# Patient Record
Sex: Female | Born: 1950
Health system: Southern US, Community
[De-identification: ages and names within clinical notes are randomized; demographics above are authoritative.]

## PROBLEM LIST (undated history)

## (undated) DIAGNOSIS — F32A Depression, unspecified: Secondary | ICD-10-CM

## (undated) DIAGNOSIS — F329 Major depressive disorder, single episode, unspecified: Secondary | ICD-10-CM

## (undated) DIAGNOSIS — K219 Gastro-esophageal reflux disease without esophagitis: Secondary | ICD-10-CM

## (undated) DIAGNOSIS — Z720 Tobacco use: Secondary | ICD-10-CM

## (undated) HISTORY — DX: Tobacco use: Z72.0

## (undated) HISTORY — PX: EYE SURGERY: SHX253

## (undated) HISTORY — PX: ABDOMINAL HYSTERECTOMY: SHX81

---
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
## 1999-12-13 ENCOUNTER — Encounter: Admission: RE | Admit: 1999-12-13 | Discharge: 1999-12-13 | Payer: Self-pay | Admitting: Internal Medicine

## 1999-12-13 ENCOUNTER — Encounter: Payer: Self-pay | Admitting: Internal Medicine

## 1999-12-17 ENCOUNTER — Other Ambulatory Visit: Admission: RE | Admit: 1999-12-17 | Discharge: 1999-12-17 | Payer: Self-pay | Admitting: Internal Medicine

## 2002-09-20 ENCOUNTER — Encounter: Admission: RE | Admit: 2002-09-20 | Discharge: 2002-09-20 | Payer: Self-pay | Admitting: Internal Medicine

## 2002-09-20 ENCOUNTER — Encounter: Payer: Self-pay | Admitting: Internal Medicine

## 2003-07-23 ENCOUNTER — Ambulatory Visit (HOSPITAL_BASED_OUTPATIENT_CLINIC_OR_DEPARTMENT_OTHER): Admission: RE | Admit: 2003-07-23 | Discharge: 2003-07-23 | Payer: Self-pay | Admitting: *Deleted

## 2004-04-02 ENCOUNTER — Encounter: Admission: RE | Admit: 2004-04-02 | Discharge: 2004-04-02 | Payer: Self-pay | Admitting: Internal Medicine

## 2004-06-25 ENCOUNTER — Encounter: Admission: RE | Admit: 2004-06-25 | Discharge: 2004-06-25 | Payer: Self-pay | Admitting: Internal Medicine

## 2005-01-10 ENCOUNTER — Ambulatory Visit (HOSPITAL_COMMUNITY): Admission: RE | Admit: 2005-01-10 | Discharge: 2005-01-10 | Payer: Self-pay | Admitting: Gastroenterology

## 2005-01-10 ENCOUNTER — Encounter (INDEPENDENT_AMBULATORY_CARE_PROVIDER_SITE_OTHER): Payer: Self-pay | Admitting: *Deleted

## 2005-07-29 ENCOUNTER — Emergency Department (HOSPITAL_COMMUNITY): Admission: EM | Admit: 2005-07-29 | Discharge: 2005-07-29 | Payer: Self-pay | Admitting: Emergency Medicine

## 2006-06-29 ENCOUNTER — Encounter: Admission: RE | Admit: 2006-06-29 | Discharge: 2006-06-29 | Payer: Self-pay | Admitting: Internal Medicine

## 2007-03-09 ENCOUNTER — Encounter: Admission: RE | Admit: 2007-03-09 | Discharge: 2007-03-09 | Payer: Self-pay | Admitting: Internal Medicine

## 2008-03-11 ENCOUNTER — Encounter: Admission: RE | Admit: 2008-03-11 | Discharge: 2008-03-11 | Payer: Self-pay | Admitting: Internal Medicine

## 2008-03-14 ENCOUNTER — Ambulatory Visit (HOSPITAL_COMMUNITY): Admission: RE | Admit: 2008-03-14 | Discharge: 2008-03-14 | Payer: Self-pay | Admitting: Internal Medicine

## 2008-03-14 ENCOUNTER — Encounter: Admission: RE | Admit: 2008-03-14 | Discharge: 2008-03-14 | Payer: Self-pay | Admitting: Internal Medicine

## 2010-05-28 ENCOUNTER — Ambulatory Visit (HOSPITAL_COMMUNITY): Admission: RE | Admit: 2010-05-28 | Discharge: 2010-05-28 | Payer: Self-pay | Admitting: Ophthalmology

## 2010-06-16 ENCOUNTER — Encounter: Admission: RE | Admit: 2010-06-16 | Discharge: 2010-06-16 | Payer: Self-pay | Admitting: Internal Medicine

## 2010-10-24 ENCOUNTER — Encounter: Payer: Self-pay | Admitting: Internal Medicine

## 2011-01-09 ENCOUNTER — Emergency Department (HOSPITAL_COMMUNITY)
Admission: EM | Admit: 2011-01-09 | Discharge: 2011-01-09 | Disposition: A | Discharge status: Home / Self Care | Payer: BC Managed Care – PPO | Attending: Emergency Medicine | Admitting: Emergency Medicine

## 2011-01-09 ENCOUNTER — Emergency Department (HOSPITAL_COMMUNITY): Payer: BC Managed Care – PPO

## 2011-01-09 DIAGNOSIS — R143 Flatulence: Secondary | ICD-10-CM | POA: Insufficient documentation

## 2011-01-09 DIAGNOSIS — R1033 Periumbilical pain: Secondary | ICD-10-CM | POA: Insufficient documentation

## 2011-01-09 DIAGNOSIS — R109 Unspecified abdominal pain: Secondary | ICD-10-CM | POA: Insufficient documentation

## 2011-01-09 DIAGNOSIS — K8689 Other specified diseases of pancreas: Secondary | ICD-10-CM | POA: Insufficient documentation

## 2011-01-09 DIAGNOSIS — R141 Gas pain: Secondary | ICD-10-CM | POA: Insufficient documentation

## 2011-01-09 DIAGNOSIS — R142 Eructation: Secondary | ICD-10-CM | POA: Insufficient documentation

## 2011-01-09 DIAGNOSIS — R112 Nausea with vomiting, unspecified: Secondary | ICD-10-CM | POA: Insufficient documentation

## 2011-01-09 DIAGNOSIS — K5732 Diverticulitis of large intestine without perforation or abscess without bleeding: Secondary | ICD-10-CM | POA: Insufficient documentation

## 2011-01-09 LAB — COMPREHENSIVE METABOLIC PANEL
ALT: 11 U/L (ref 0–35)
Albumin: 3.8 g/dL (ref 3.5–5.2)
Alkaline Phosphatase: 86 U/L (ref 39–117)
BUN: 6 mg/dL (ref 6–23)
Chloride: 106 mEq/L (ref 96–112)
Glucose, Bld: 98 mg/dL (ref 70–99)
Potassium: 4.1 mEq/L (ref 3.5–5.1)
Sodium: 135 mEq/L (ref 135–145)
Total Bilirubin: 1 mg/dL (ref 0.3–1.2)

## 2011-01-09 LAB — URINALYSIS, ROUTINE W REFLEX MICROSCOPIC
Bilirubin Urine: NEGATIVE
Glucose, UA: NEGATIVE mg/dL
Ketones, ur: NEGATIVE mg/dL
Protein, ur: NEGATIVE mg/dL
pH: 7 (ref 5.0–8.0)

## 2011-01-09 LAB — DIFFERENTIAL
Basophils Absolute: 0 10*3/uL (ref 0.0–0.1)
Lymphocytes Relative: 24 % (ref 12–46)
Lymphs Abs: 2.1 10*3/uL (ref 0.7–4.0)
Neutro Abs: 6.2 10*3/uL (ref 1.7–7.7)
Neutrophils Relative %: 70 % (ref 43–77)

## 2011-01-09 LAB — CBC
HCT: 40.1 % (ref 36.0–46.0)
MCV: 87 fL (ref 78.0–100.0)
Platelets: 245 10*3/uL (ref 150–400)
RBC: 4.61 MIL/uL (ref 3.87–5.11)
WBC: 8.9 10*3/uL (ref 4.0–10.5)

## 2011-01-09 LAB — URINE MICROSCOPIC-ADD ON

## 2011-01-09 MED ORDER — IOHEXOL 300 MG/ML  SOLN
100.0000 mL | Freq: Once | INTRAMUSCULAR | Status: AC | PRN
  Administered 2011-01-09: 100 mL via INTRAVENOUS

## 2011-01-30 NOTE — Consult Note (Signed)
Maureen Rodgers, Maureen Rodgers NO.:  [id number]  MEDICAL RECORD NO.:  [id number]           PATIENT TYPE:  E  LOCATION:  [facility]                         FACILITY:  [facility]  PHYSICIAN:  Peggye Pitt, M.D. DATE OF BIRTH:  [birth year]  DATE OF CONSULTATION:  01/09/2011 DATE OF DISCHARGE:                                CONSULTATION   PRIMARY CARE PHYSICIAN:  Robyn N. Allyne Gee, MD  CHIEF COMPLAINT:  Abdominal pain, nausea, vomiting.  HISTORY OF PRESENT ILLNESS:  [name] is a 60 year old African- American lady with past medical history only significant for anxiety disorder, who presents to the hospital today with about 4 days of left lower quadrant abdominal pain.  Today, she had one episode of bile emesis and decided to come into the hospital for evaluation.  She has had no fevers or any other symptoms.  She has been able to tolerate food and in fact has had a full meal here in the emergency department.  A CAT scan had shown sigmoid diverticulitis and we were asked to admit her for further evaluation.  ALLERGIES:  She has no known drug allergies.  PAST MEDICAL HISTORY:  Significant for anxiety disorder and insomnia.  HOME MEDICATIONS: 1. Zoloft 50 mg daily. 2. Trazodone 50 mg at bedtime.  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  Denies any illicit drug use.  FAMILY HISTORY:  Very significant for colon cancer.  REVIEW OF SYSTEMS:  Negative except as mentioned in history present illness.  PHYSICAL EXAM:  VITAL SIGNS:  On admission, blood pressure 115/78, heart rate 60, respirations 20, sats of 100% on room air, and temperature of 98.0. GENERAL:  She is alert, awake, oriented x3, in no distress. HEENT:  Normocephalic, atraumatic.  Pupils are equal, round, and reactive to light and accommodation.  She has intact extraocular movements.  Moist mucous membranes. NECK:  Supple.   No JVD.  No lymphadenopathy.  No bruits.  No goiter. HEART:  Regular rate and rhythm with no murmurs, rubs, or gallops. LUNGS:  Clear to auscultation bilaterally. ABDOMEN:  Soft, slightly tender to palpation to the left lower quadrant. There is no guarding, no rebound, no masses or organomegaly are noted. She does have positive bowel sounds. EXTREMITIES:  No edema.  Positive pulses. NEUROLOGIC:  Grossly intact and nonfocal.  LABORATORY DATA:  Labs on admission; sodium 135, potassium 4.1, chloride 106, bicarb 18, BUN 6, creatinine 0.88, glucose of 98.  All of her LFTs are within normal limits.  WBC is 8.9, hemoglobin 13.9, platelets of 245.  Lipase of 18.  A CT scan of the abdomen and pelvis shows uncomplicated sigmoid diverticulitis as well as dilatation of the proximal pancreatic duct, recommend follow up MRI.  ASSESSMENT AND PLAN: 1. Uncomplicated sigmoid diverticulitis.  The patient has only had one     episode of nausea.  She is able to tolerate meals.  I believe at     this point that she will not gain any further benefit from     admission to  the hospital, and so I will discharge her home to     complete a 21-day course of antibiotics to include Cipro 500 mg     twice daily as well as Flagyl 500 mg three times a day.  I will     also give her a prescription for Zofran for nausea and for Vicodin     for pain as needed. 2. Dilatation of the proximal pancreatic duct.  This was noted     incidentally on her CT scan.  The radiologist is recommending an     MRCP to follow up once her abdominal pain has improved and she can     tolerate holding her breath in for a period of time.  She can     follow up with her GI physician, Dr. Loreta Ave for these issues. 3. I have asked her to secure follow up appointment with her PCP, Dr.     Allyne Gee next week to follow up on her diverticulitis.     Peggye Pitt, M.D.     EH/MEDQ  D:  01/09/2011  T:  01/09/2011  Job:  [id number]  cc:   Candyce Churn. Allyne Gee, M.D.  Electronically Signed by Peggye Pitt M.D. on 01/30/2011 09:37:15 AM

## 2011-02-18 NOTE — Op Note (Signed)
Maureen Rodgers, Maureen Rodgers             ACCOUNT NO.:  [id number]   MEDICAL RECORD NO.:  [id number]          PATIENT TYPE:  AMB   LOCATION:  ENDO                         FACILITY:  [facility]   PHYSICIAN:  Anselmo Rod, M.D.  DATE OF BIRTH:  04/28/1951   DATE OF PROCEDURE:  01/10/2005  DATE OF DISCHARGE:                                 OPERATIVE REPORT   PROCEDURE PERFORMED:  Colonoscopy with multiple cold biopsies.   ENDOSCOPIST:  Charna Elizabeth, M.D.   INSTRUMENT USED:  Olympus video colonoscope.   INDICATIONS FOR PROCEDURE:  The patient is a 60 year old African-American  female undergoing screening colonoscopy to rule out colonic polyps, masses,  etc.   PREPROCEDURE PREPARATION:  Informed consent was procured from the patient.  The patient was fasted for eight hours prior to the procedure and prepped  with a bottle of magnesium citrate and a gallon of GoLYTELY the night prior  to the procedure.  The risks and benefits of the procedure including a 10%  miss rate for cancer and polyps was discussed with her as well.   PREPROCEDURE PHYSICAL:  The patient had stable vital signs.  Neck supple.  Chest clear to auscultation.  S1 and S2 regular.  Abdomen soft with normal  bowel sounds.   DESCRIPTION OF PROCEDURE:  The patient was placed in left lateral decubitus  position and sedated with 100 mg of Demerol and 10 mg of Versed in slow  incremental doses.  Once the patient was adequately sedated and maintained  on low flow oxygen and continuous cardiac monitoring, the Olympus video  colonoscope was advanced from the rectum to the cecum.  The appendicular  orifice and ileocecal valve were clearly visualized and photographed.  There  were a few sigmoid diverticula noted.  These were in early stages of  formation.  Five small sessile rectosigmoid polyps were biopsied (cold  biopsies).  A small sessile polyp was biopsied from the hepatic flexure.  The rest of the exam up to the terminal ileum was  unremarkable.  Small  internal hemorrhoids were seen on retroflexion.  The patient tolerated the  procedure well without complication.   IMPRESSION:  1.  Small nonbleeding internal hemorrhoids seen on retroflexion.  2.  Five small sessile polyps biopsied from rectosigmoid colon and one small      sessile polyp biopsied from hepatic flexure.  3.  Few early sigmoid diverticula.  4.  Normal-appearing transverse colon except for the small polyp biopsied      close to hepatic flexure.  5.  Normal-appearing right colon, cecum and terminal ileum.   RECOMMENDATIONS:  1.  Await pathology results.  2.  Avoid all nonsteroidals including aspirin for the next two weeks.  3.  Outpatient followup in the next two weeks or earlier if need be.      JNM/MEDQ  D:  01/10/2005  T:  01/10/2005  Job:  [id number]   cc:   Candyce Churn. Allyne Gee, M.D.  [address]  [address]  [place]  [place] [address]  Fax: [phone]

## 2011-02-18 NOTE — Op Note (Signed)
   NAME:  Maureen Rodgers, Maureen Rodgers                       ACCOUNT NO.:  [id number]   MEDICAL RECORD NO.:  [id number]                   PATIENT TYPE:  AMB   LOCATION:  [facility]SC                                  FACILITY:  [facility]   PHYSICIAN:  Lowell Bouton, M.D.      DATE OF BIRTH:  05-07-1951   DATE OF PROCEDURE:  07/23/2003  DATE OF DISCHARGE:                                 OPERATIVE REPORT   PREOPERATIVE DIAGNOSIS:  Left carpal tunnel syndrome.   POSTOPERATIVE DIAGNOSIS:  Left carpal tunnel syndrome.   OPERATION PERFORMED:  Decompression of median nerve, left carpal tunnel.   SURGEON:  Lowell Bouton, M.D.   ANESTHESIA:  0.5% Marcaine local with sedation.   OPERATIVE FINDINGS:  The patient had no masses in the carpal canal and the  motor branch of the nerve was intact.   DESCRIPTION OF PROCEDURE:  Under 0.5% Marcaine local anesthesia with a  tourniquet on the left arm, the left hand was prepped and draped in the  usual fashion and after exsanguinating the limb, the tourniquet was inflated  to 250 mmHg.  A 3 cm longitudinal incision was made in the palm just ulnar  to the thenar crease.  Sharp dissection was carried through the subcutaneous  tissues and bleeding points were coagulated.  Blunt dissection was carried  down through the superficial palmar fascia distal to the transverse carpal  ligament.  A hemostat was placed in the carpal canal up against the hook of  the hamate and the transverse carpal ligament was divided on the ulnar  border of the median nerve.  The proximal end of the ligament was divided  with the scissors after dissecting the nerve away from the undersurface of  the ligament.  The carpal canal was then palpated and was found to be  adequately decompressed.  The nerve was examined and the motor branch  identified.  The wound was then irrigated with saline.  The skin was closed  with 4-0 nylon suture.  Sterile dressings were applied followed by a  volar  wrist splint.  The patient tolerated the procedure well and went to the  recovery room awake and stable in  good condition.                                                Lowell Bouton, M.D.    EMM/MEDQ  D:  07/23/2003  T:  07/23/2003  Job:  [id number]   cc:   Candyce Churn. Allyne Gee, M.D.  [address]  [address]  [place]  [place] [address]  Fax: [phone]

## 2011-06-07 ENCOUNTER — Emergency Department (HOSPITAL_COMMUNITY)
Admission: EM | Admit: 2011-06-07 | Discharge: 2011-06-07 | Disposition: A | Discharge status: Home / Self Care | Payer: BC Managed Care – PPO | Attending: Emergency Medicine | Admitting: Emergency Medicine

## 2011-06-07 ENCOUNTER — Emergency Department (HOSPITAL_COMMUNITY): Payer: BC Managed Care – PPO

## 2011-06-07 DIAGNOSIS — S62109A Fracture of unspecified carpal bone, unspecified wrist, initial encounter for closed fracture: Secondary | ICD-10-CM | POA: Insufficient documentation

## 2011-06-07 DIAGNOSIS — Y92009 Unspecified place in unspecified non-institutional (private) residence as the place of occurrence of the external cause: Secondary | ICD-10-CM | POA: Insufficient documentation

## 2011-06-07 DIAGNOSIS — M25539 Pain in unspecified wrist: Secondary | ICD-10-CM | POA: Insufficient documentation

## 2011-06-07 DIAGNOSIS — W010XXA Fall on same level from slipping, tripping and stumbling without subsequent striking against object, initial encounter: Secondary | ICD-10-CM | POA: Insufficient documentation

## 2013-06-05 ENCOUNTER — Other Ambulatory Visit: Payer: Self-pay | Admitting: Dermatology

## 2013-07-28 ENCOUNTER — Emergency Department (HOSPITAL_COMMUNITY)
Admission: EM | Admit: 2013-07-28 | Discharge: 2013-07-29 | Disposition: A | Discharge status: Home / Self Care | Payer: BC Managed Care – PPO | Attending: Emergency Medicine | Admitting: Emergency Medicine

## 2013-07-28 ENCOUNTER — Emergency Department (HOSPITAL_COMMUNITY): Payer: BC Managed Care – PPO

## 2013-07-28 ENCOUNTER — Encounter (HOSPITAL_COMMUNITY): Payer: Self-pay | Admitting: Emergency Medicine

## 2013-07-28 DIAGNOSIS — F17200 Nicotine dependence, unspecified, uncomplicated: Secondary | ICD-10-CM | POA: Insufficient documentation

## 2013-07-28 DIAGNOSIS — M25561 Pain in right knee: Secondary | ICD-10-CM

## 2013-07-28 DIAGNOSIS — Z79899 Other long term (current) drug therapy: Secondary | ICD-10-CM | POA: Insufficient documentation

## 2013-07-28 DIAGNOSIS — K219 Gastro-esophageal reflux disease without esophagitis: Secondary | ICD-10-CM | POA: Insufficient documentation

## 2013-07-28 DIAGNOSIS — M25569 Pain in unspecified knee: Secondary | ICD-10-CM | POA: Insufficient documentation

## 2013-07-28 HISTORY — DX: Gastro-esophageal reflux disease without esophagitis: K21.9

## 2013-07-28 MED ORDER — OXYCODONE-ACETAMINOPHEN 5-325 MG PO TABS: 2.0000 | ORAL_TABLET | Freq: Four times a day (QID) | ORAL | Status: DC | PRN

## 2013-07-28 MED ORDER — IBUPROFEN 400 MG PO TABS
800.0000 mg | ORAL_TABLET | Freq: Once | ORAL | Status: AC
  Administered 2013-07-29: 800 mg via ORAL
  Filled 2013-07-28: qty 2

## 2013-07-28 NOTE — ED Provider Notes (Signed)
CSN: [id number]     Arrival date & time 07/28/13  2224 History   First MD Initiated Contact with Patient 07/28/13 2310    This chart was scribed for Ivar Drape PA-C, a non-physician practitioner working with Gwyneth Sprout, MD by Lewanda Rife, ED Scribe. This patient was seen in room TR11C/TR11C and the patient's care was started at 11:28 PM     Chief Complaint  Patient presents with  . Knee Pain   (Consider location/radiation/quality/duration/timing/severity/associated sxs/prior Treatment) The history is provided by the patient. No language interpreter was used.   HPI Comments: Maureen Rodgers is a 62 y.o. female who presents to the Emergency Department complaining of constant worsening right knee pain radiating to right hip onset today while walking up stairs at home. Describes pain as shooting. Reports associated swelling. Reports pain is exacerbated by walking and flexion of right knee. Reports trying ice, elevation, and ibuprofen with mild relief of symptoms. Denies associated recent trauma/injury, recent falls, fever, decreased ROM, weakness, and numbness. Reports having an antalgic gait.    Past Medical History  Diagnosis Date  . GERD (gastroesophageal reflux disease)    History reviewed. No pertinent past surgical history. History reviewed. No pertinent family history. History  Substance Use Topics  . Smoking status: Current Every Day Smoker  . Smokeless tobacco: Not on file  . Alcohol Use: No   OB History   Grav Para Term Preterm Abortions TAB SAB Ect Mult Living                 Review of Systems  Constitutional: Negative for fever.  Musculoskeletal: Positive for arthralgias.  All other systems reviewed and are negative.   A complete 10 system review of systems was obtained and all systems are negative except as noted in the HPI and PMHx.    Allergies  Review of patient's allergies indicates no known allergies.  Home Medications   Current Outpatient  Rx  Name  Route  Sig  Dispense  Refill  . alendronate (FOSAMAX) 70 MG tablet   Oral   Take 70 mg by mouth every 7 (seven) days. Take with a full glass of water on an empty stomach. Friday         . ALPRAZolam (XANAX) 0.5 MG tablet   Oral   Take 0.5 mg by mouth at bedtime as needed for sleep (anxiety).         Marland Kitchen esomeprazole (NEXIUM) 40 MG capsule   Oral   Take 40 mg by mouth daily before breakfast.         . ibuprofen (ADVIL,MOTRIN) 200 MG tablet   Oral   Take 200 mg by mouth every 6 (six) hours as needed for pain.         . Omega-3 Fatty Acids (FISH OIL) 1000 MG CAPS   Oral   Take 1,000 mg by mouth daily.         . sertraline (ZOLOFT) 100 MG tablet   Oral   Take 100 mg by mouth daily.         . traZODone (DESYREL) 50 MG tablet   Oral   Take 50 mg by mouth at bedtime.         . Vitamin D, Ergocalciferol, (DRISDOL) 50000 UNITS CAPS capsule   Oral   Take 50,000 Units by mouth See admin instructions. Pt takes twice weekly. Tuesday and Friday          BP 128/64  Pulse 75  Temp(Src) 98.3  F (36.8 C) (Oral)  Resp 16  SpO2 100% Physical Exam  Nursing note and vitals reviewed. Constitutional: She is oriented to person, place, and time. She appears well-developed and well-nourished. No distress.  HENT:  Head: Normocephalic and atraumatic.  Eyes: EOM are normal.  Neck: Neck supple. No tracheal deviation present.  Cardiovascular: Normal rate, intact distal pulses and normal pulses.   Pulses:      Dorsalis pedis pulses are 2+ on the right side.       Posterior tibial pulses are 2+ on the right side.  Pulmonary/Chest: Effort normal. No respiratory distress.  Musculoskeletal: Normal range of motion. She exhibits tenderness. She exhibits no edema.       Right knee: She exhibits normal range of motion, no swelling, no deformity and no erythema. Tenderness found. Medial joint line and lateral joint line tenderness noted. No patellar tendon tenderness noted.   Right knee: No bony tenderness. Pain with flexion. Positive McMurray's test.    Neurological: She is alert and oriented to person, place, and time. She has normal strength. No sensory deficit.  Skin: Skin is warm and dry.  No erythema, or evidence of infection  Psychiatric: She has a normal mood and affect. Her behavior is normal.    ED Course  Procedures  COORDINATION OF CARE:  Nursing notes reviewed. Vital signs reviewed. Initial pt interview and examination performed.   11:10 PM-Discussed work up plan with pt at bedside, which includes x-ray right knee. Pt agrees with plan.  11:24 PM Nursing Notes Reviewed/ Care Coordinated Applicable Imaging Reviewed and incorporated into ED treatment Discussed results and treatment plan with pt. Pt demonstrates understanding and agrees with plan to f/u with Orthopedics during the week. Encouraged to continue icing, elevate, and take NSAIDS.   Treatment plan initiated: Medications  ibuprofen (ADVIL,MOTRIN) tablet 800 mg (not administered)    Initial diagnostic testing ordered.    Imaging Review Dg Knee Complete 4 Views Right  07/28/2013   CLINICAL DATA:  Pain wall climbing stairs.  EXAM: RIGHT KNEE - COMPLETE 4+ VIEW  COMPARISON:  None.  FINDINGS: There is no evidence of fracture, dislocation, or joint effusion. There is no evidence of arthropathy or other focal bone abnormality. Soft tissues are unremarkable.  IMPRESSION: Negative.   Electronically Signed   By: Oley Balm M.D.   On: 07/28/2013 23:05    EKG Interpretation   None       MDM   1. Knee pain, acute, right    Patient with right knee pain.  Appears to be meniscus.  Will give knee sleeve and crutches.  No unilateral leg calf swelling.  No evidence of infection.  Pain worsened with knee stability testing.  Discharge home with pain meds and ortho follow-up.  Patient is able to ambulate, however, it is painful.  I personally performed the services described in this  documentation, which was scribed in my presence. The recorded information has been reviewed and is accurate.     Roxy Horseman, PA-C 07/28/13 2356

## 2013-07-28 NOTE — ED Notes (Signed)
Patient states she was stepping up at home and felt a pain in right knee.  She states the there has not been any injury to that knee.  She continues with the pain.  Some swelling to right knee noted.

## 2013-07-28 NOTE — ED Notes (Signed)
X-ray notified to bring pt to room

## 2013-07-29 NOTE — Progress Notes (Signed)
Orthopedic Tech Progress Note Patient Details:  Maureen Rodgers 12-07-1950 [id number]  Ortho Devices Type of Ortho Device: Knee Sleeve;Crutches   Haskell Flirt 07/29/2013, 12:07 AM

## 2013-07-30 NOTE — ED Provider Notes (Signed)
Medical screening examination/treatment/procedure(s) were performed by non-physician practitioner and as supervising physician I was immediately available for consultation/collaboration.  EKG Interpretation   None         Lainey Nelson M Allie Gerhold, DO 07/30/13 0702 

## 2015-04-10 ENCOUNTER — Encounter (HOSPITAL_COMMUNITY): Payer: Self-pay | Admitting: Emergency Medicine

## 2015-04-10 ENCOUNTER — Emergency Department (HOSPITAL_COMMUNITY)
Admission: EM | Admit: 2015-04-10 | Discharge: 2015-04-10 | Disposition: A | Discharge status: Home / Self Care | Payer: BLUE CROSS/BLUE SHIELD | Source: Home / Self Care

## 2015-04-10 DIAGNOSIS — W57XXXA Bitten or stung by nonvenomous insect and other nonvenomous arthropods, initial encounter: Secondary | ICD-10-CM | POA: Diagnosis not present

## 2015-04-10 DIAGNOSIS — T148 Other injury of unspecified body region: Secondary | ICD-10-CM

## 2015-04-10 MED ORDER — TRIAMCINOLONE ACETONIDE 40 MG/ML IJ SUSP
40.0000 mg | Freq: Once | INTRAMUSCULAR | Status: AC
  Administered 2015-04-10: 40 mg via INTRAMUSCULAR

## 2015-04-10 MED ORDER — HYDROXYZINE HCL 25 MG PO TABS: 25.0000 mg | ORAL_TABLET | Freq: Four times a day (QID) | ORAL | Status: DC

## 2015-04-10 MED ORDER — TRIAMCINOLONE ACETONIDE 40 MG/ML IJ SUSP
INTRAMUSCULAR | Status: AC
  Filled 2015-04-10: qty 1

## 2015-04-10 MED ORDER — FLUTICASONE PROPIONATE 0.05 % EX CREA: TOPICAL_CREAM | Freq: Two times a day (BID) | CUTANEOUS | Status: DC

## 2015-04-10 NOTE — ED Provider Notes (Signed)
CSN: [id number]     Arrival date & time 04/10/15  1855 History   None    Chief Complaint  Patient presents with  . Insect Bite   (Consider location/radiation/quality/duration/timing/severity/associated sxs/prior Treatment) Patient is a 64 y.o. female presenting with rash. The history is provided by the patient.  Rash Location:  Shoulder/arm Shoulder/arm rash location:  L forearm and R forearm Quality: itchiness   Severity:  Moderate Onset quality:  Sudden Duration:  1 day Progression:  Spreading Chronicity:  New Context: insect bite/sting   Context comment:  Onset after working in garden last eve. Relieved by:  Nothing Worsened by:  Nothing tried Ineffective treatments:  None tried Associated symptoms: no fever and not wheezing     Past Medical History  Diagnosis Date  . GERD (gastroesophageal reflux disease)    Past Surgical History  Procedure Laterality Date  . Abdominal hysterectomy    . Eye surgery     History reviewed. No pertinent family history. History  Substance Use Topics  . Smoking status: Current Every Day Smoker  . Smokeless tobacco: Not on file  . Alcohol Use: No   OB History    No data available     Review of Systems  Constitutional: Negative.  Negative for fever.  Respiratory: Negative for wheezing.   Skin: Positive for rash.    Allergies  Review of patient's allergies indicates no known allergies.  Home Medications   Prior to Admission medications   Medication Sig Start Date End Date Taking? Authorizing Provider  alendronate (FOSAMAX) 70 MG tablet Take 70 mg by mouth every 7 (seven) days. Take with a full glass of water on an empty stomach. Friday   Yes Historical Provider, MD  Omega-3 Fatty Acids (FISH OIL) 1000 MG CAPS Take 1,000 mg by mouth daily.   Yes Historical Provider, MD  sertraline (ZOLOFT) 100 MG tablet Take 100 mg by mouth daily.   Yes Historical Provider, MD  traZODone (DESYREL) 50 MG tablet Take 50 mg by mouth at bedtime.    Yes Historical Provider, MD  Vitamin D, Ergocalciferol, (DRISDOL) 50000 UNITS CAPS capsule Take 50,000 Units by mouth See admin instructions. Pt takes twice weekly. Tuesday and Friday   Yes Historical Provider, MD  ALPRAZolam Duanne Moron) 0.5 MG tablet Take 0.5 mg by mouth at bedtime as needed for sleep (anxiety).    Historical Provider, MD  esomeprazole (NEXIUM) 40 MG capsule Take 40 mg by mouth daily before breakfast.    Historical Provider, MD  fluticasone (CUTIVATE) 0.05 % cream Apply topically 2 (two) times daily. 04/10/15   Billy Fischer, MD  hydrOXYzine (ATARAX/VISTARIL) 25 MG tablet Take 1 tablet (25 mg total) by mouth every 6 (six) hours. For itching 04/10/15   Billy Fischer, MD  ibuprofen (ADVIL,MOTRIN) 200 MG tablet Take 200 mg by mouth every 6 (six) hours as needed for pain.    Historical Provider, MD  oxyCODONE-acetaminophen (PERCOCET/ROXICET) 5-325 MG per tablet Take 2 tablets by mouth every 6 (six) hours as needed for pain. 07/28/13   Montine Circle, PA-C   BP 152/90 mmHg  Pulse 73  Temp(Src) 98.1 F (36.7 C) (Oral)  Resp 16  SpO2 94% Physical Exam  Constitutional: She is oriented to person, place, and time. She appears well-developed and well-nourished.  Neurological: She is alert and oriented to person, place, and time.  Skin: Skin is warm and dry. Rash noted.  Scattered crusting papular lesions irreg on bilat forearms only.  Nursing note and vitals reviewed.  ED Course  Procedures (including critical care time) Labs Review Labs Reviewed - No data to display  Imaging Review No results found.   MDM   1. Multiple insect bites        Billy Fischer, MD 04/10/15 [phone]

## 2015-04-10 NOTE — ED Notes (Signed)
Pt was working in her flower garden last night and when she went in to shower, she noticed a few bites on her arms.  Since then it has spread all up her right arm and this morning her left arm.

## 2015-07-20 ENCOUNTER — Ambulatory Visit
Admission: RE | Admit: 2015-07-20 | Discharge: 2015-07-20 | Disposition: A | Discharge status: Home / Self Care | Payer: BLUE CROSS/BLUE SHIELD | Source: Ambulatory Visit | Attending: Internal Medicine | Admitting: Internal Medicine

## 2015-07-20 ENCOUNTER — Other Ambulatory Visit: Payer: Self-pay | Admitting: Nurse Practitioner

## 2015-07-20 DIAGNOSIS — R0781 Pleurodynia: Secondary | ICD-10-CM

## 2015-07-20 DIAGNOSIS — R05 Cough: Secondary | ICD-10-CM

## 2015-07-20 DIAGNOSIS — R059 Cough, unspecified: Secondary | ICD-10-CM

## 2015-10-23 ENCOUNTER — Emergency Department (HOSPITAL_COMMUNITY)
Admission: EM | Admit: 2015-10-23 | Discharge: 2015-10-23 | Disposition: A | Discharge status: Home / Self Care | Payer: BLUE CROSS/BLUE SHIELD | Attending: Emergency Medicine | Admitting: Emergency Medicine

## 2015-10-23 ENCOUNTER — Emergency Department (HOSPITAL_COMMUNITY): Payer: BLUE CROSS/BLUE SHIELD

## 2015-10-23 ENCOUNTER — Encounter (HOSPITAL_COMMUNITY): Payer: Self-pay | Admitting: Emergency Medicine

## 2015-10-23 DIAGNOSIS — K219 Gastro-esophageal reflux disease without esophagitis: Secondary | ICD-10-CM | POA: Diagnosis not present

## 2015-10-23 DIAGNOSIS — Z79899 Other long term (current) drug therapy: Secondary | ICD-10-CM | POA: Diagnosis not present

## 2015-10-23 DIAGNOSIS — F17200 Nicotine dependence, unspecified, uncomplicated: Secondary | ICD-10-CM | POA: Insufficient documentation

## 2015-10-23 DIAGNOSIS — K5792 Diverticulitis of intestine, part unspecified, without perforation or abscess without bleeding: Secondary | ICD-10-CM | POA: Insufficient documentation

## 2015-10-23 DIAGNOSIS — R112 Nausea with vomiting, unspecified: Secondary | ICD-10-CM | POA: Diagnosis present

## 2015-10-23 LAB — URINALYSIS, ROUTINE W REFLEX MICROSCOPIC
BILIRUBIN URINE: NEGATIVE
GLUCOSE, UA: NEGATIVE mg/dL
HGB URINE DIPSTICK: NEGATIVE
Ketones, ur: NEGATIVE mg/dL
Leukocytes, UA: NEGATIVE
Nitrite: NEGATIVE
PH: 6 (ref 5.0–8.0)
Protein, ur: NEGATIVE mg/dL

## 2015-10-23 LAB — COMPREHENSIVE METABOLIC PANEL
ALBUMIN: 3.8 g/dL (ref 3.5–5.0)
ALK PHOS: 55 U/L (ref 38–126)
ALT: 15 U/L (ref 14–54)
ANION GAP: 11 (ref 5–15)
AST: 17 U/L (ref 15–41)
BUN: <5 mg/dL — ABNORMAL LOW (ref 6–20)
CHLORIDE: 105 mmol/L (ref 101–111)
CO2: 25 mmol/L (ref 22–32)
Calcium: 9.5 mg/dL (ref 8.9–10.3)
Creatinine, Ser: 1.03 mg/dL — ABNORMAL HIGH (ref 0.44–1.00)
GFR calc non Af Amer: 56 mL/min — ABNORMAL LOW (ref >60)
GLUCOSE: 117 mg/dL — AB (ref 65–99)
Potassium: 3.3 mmol/L — ABNORMAL LOW (ref 3.5–5.1)
SODIUM: 141 mmol/L (ref 135–145)
Total Bilirubin: 0.8 mg/dL (ref 0.3–1.2)
Total Protein: 7.3 g/dL (ref 6.5–8.1)

## 2015-10-23 LAB — CBC
HCT: 43.4 % (ref 36.0–46.0)
HEMOGLOBIN: 14.7 g/dL (ref 12.0–15.0)
MCH: 29.8 pg (ref 26.0–34.0)
MCHC: 33.9 g/dL (ref 30.0–36.0)
MCV: 88 fL (ref 78.0–100.0)
Platelets: 259 10*3/uL (ref 150–400)
RBC: 4.93 MIL/uL (ref 3.87–5.11)
RDW: 13.2 % (ref 11.5–15.5)
WBC: 11.7 10*3/uL — ABNORMAL HIGH (ref 4.0–10.5)

## 2015-10-23 LAB — LIPASE, BLOOD: LIPASE: 17 U/L (ref 11–51)

## 2015-10-23 MED ORDER — IOHEXOL 300 MG/ML  SOLN
25.0000 mL | Freq: Once | INTRAMUSCULAR | Status: AC | PRN
  Administered 2015-10-23: 25 mL via ORAL

## 2015-10-23 MED ORDER — IOHEXOL 300 MG/ML  SOLN
100.0000 mL | Freq: Once | INTRAMUSCULAR | Status: AC | PRN
  Administered 2015-10-23: 100 mL via INTRAVENOUS

## 2015-10-23 MED ORDER — ACETAMINOPHEN 500 MG PO TABS
1000.0000 mg | ORAL_TABLET | Freq: Once | ORAL | Status: AC
  Administered 2015-10-23: 1000 mg via ORAL
  Filled 2015-10-23: qty 2

## 2015-10-23 MED ORDER — SODIUM CHLORIDE 0.9 % IV BOLUS (SEPSIS)
1000.0000 mL | Freq: Once | INTRAVENOUS | Status: AC
  Administered 2015-10-23: 1000 mL via INTRAVENOUS

## 2015-10-23 MED ORDER — CIPROFLOXACIN HCL 500 MG PO TABS
500.0000 mg | ORAL_TABLET | Freq: Once | ORAL | Status: AC
  Administered 2015-10-23: 500 mg via ORAL
  Filled 2015-10-23: qty 1

## 2015-10-23 MED ORDER — MORPHINE SULFATE (PF) 4 MG/ML IV SOLN
4.0000 mg | Freq: Once | INTRAVENOUS | Status: AC
  Administered 2015-10-23: 4 mg via INTRAVENOUS
  Filled 2015-10-23: qty 1

## 2015-10-23 MED ORDER — CIPROFLOXACIN HCL 500 MG PO TABS: 500.0000 mg | ORAL_TABLET | Freq: Two times a day (BID) | ORAL | Status: DC

## 2015-10-23 MED ORDER — ONDANSETRON 4 MG PO TBDP
ORAL_TABLET | ORAL | Status: AC
  Filled 2015-10-23: qty 1

## 2015-10-23 MED ORDER — METRONIDAZOLE 500 MG PO TABS: 500.0000 mg | ORAL_TABLET | Freq: Three times a day (TID) | ORAL | Status: DC

## 2015-10-23 MED ORDER — METRONIDAZOLE 500 MG PO TABS
500.0000 mg | ORAL_TABLET | Freq: Once | ORAL | Status: AC
Start: 2015-10-23 — End: 2015-10-23
  Administered 2015-10-23: 500 mg via ORAL
  Filled 2015-10-23: qty 1

## 2015-10-23 MED ORDER — ONDANSETRON 4 MG PO TBDP
4.0000 mg | ORAL_TABLET | Freq: Once | ORAL | Status: AC | PRN
Start: 2015-10-23 — End: 2015-10-23
  Administered 2015-10-23: 4 mg via ORAL

## 2015-10-23 MED ORDER — ONDANSETRON 4 MG PO TBDP: ORAL_TABLET | ORAL | Status: DC

## 2015-10-23 MED ORDER — HYDROCODONE-ACETAMINOPHEN 5-325 MG PO TABS: 1.0000 | ORAL_TABLET | Freq: Four times a day (QID) | ORAL | Status: DC | PRN

## 2015-10-23 NOTE — ED Notes (Signed)
Onset last night LLQ shooting pain with nausea, emesis and chills. Continued today with radiating lower back pain.

## 2015-10-23 NOTE — ED Provider Notes (Signed)
CSN: [id number]     Arrival date & time 10/23/15  1224 History   First MD Initiated Contact with Patient 10/23/15 1639     Chief Complaint  Patient presents with  . Abdominal Pain  . Nausea  . Emesis     (Consider location/radiation/quality/duration/timing/severity/associated sxs/prior Treatment) The history is provided by the patient.  Maureen Rodgers is a 65 y.o. female history diverticulitis here presenting with left lower quadrant pain, vomiting, chills. Patient states that she has diffuse abdominal pain yesterday. It was severe enough and cause her to vomit all night. Today the pain radiated to the left lower quadrant and is sharp. It's associated with some chills as well. Denies any urinary symptoms. Denies any constipation or diarrhea. Patient states that she had diverticulitis previously and this is similar.      Past Medical History  Diagnosis Date  . GERD (gastroesophageal reflux disease)    Past Surgical History  Procedure Laterality Date  . Abdominal hysterectomy    . Eye surgery     No family history on file. Social History  Substance Use Topics  . Smoking status: Current Every Day Smoker  . Smokeless tobacco: None  . Alcohol Use: No   OB History    No data available     Review of Systems  Gastrointestinal: Positive for vomiting and abdominal pain.  All other systems reviewed and are negative.     Allergies  Review of patient's allergies indicates no known allergies.  Home Medications   Prior to Admission medications   Medication Sig Start Date End Date Taking? Authorizing Provider  alendronate (FOSAMAX) 70 MG tablet Take 70 mg by mouth every 7 (seven) days. Take with a full glass of water on an empty stomach. Friday   Yes Historical Provider, MD  ALPRAZolam Duanne Moron) 0.5 MG tablet Take 0.5 mg by mouth at bedtime as needed for sleep (anxiety).   Yes Historical Provider, MD  esomeprazole (NEXIUM) 40 MG capsule Take 40 mg by mouth daily before breakfast.    Yes Historical Provider, MD  Omega-3 Fatty Acids (FISH OIL) 1000 MG CAPS Take 1,000 mg by mouth daily.   Yes Historical Provider, MD  PROAIR HFA 108 (90 Base) MCG/ACT inhaler INHALE 2 PUFFS EVERY 4-6 HOURS 07/20/15  Yes Historical Provider, MD  traZODone (DESYREL) 50 MG tablet Take 50 mg by mouth at bedtime.   Yes Historical Provider, MD  Vitamin D, Ergocalciferol, (DRISDOL) 50000 UNITS CAPS capsule Take 50,000 Units by mouth See admin instructions. Pt takes twice weekly. Tuesday and Friday   Yes Historical Provider, MD   BP 119/89 mmHg  Pulse 76  Temp(Src) 98.6 F (37 C) (Oral)  Resp 20  Ht 5\' 3"  (1.6 m)  Wt 140 lb (63.504 kg)  BMI 24.81 kg/m2  SpO2 100% Physical Exam  Constitutional: She is oriented to person, place, and time. She appears well-nourished.  Uncomfortable   HENT:  Head: Normocephalic.  Mouth/Throat: Oropharynx is clear and moist.  Eyes: Conjunctivae are normal. Pupils are equal, round, and reactive to light.  Neck: Normal range of motion. Neck supple.  Cardiovascular: Normal rate, regular rhythm and normal heart sounds.   Pulmonary/Chest: Effort normal and breath sounds normal. No respiratory distress. She has no wheezes. She has no rales.  Abdominal: Soft. Bowel sounds are normal.  + mild diffuse tenderness, worse in LLQ.   Musculoskeletal: Normal range of motion. She exhibits no edema or tenderness.  Neurological: She is alert and oriented to person, place, and  time.  Skin: Skin is warm and dry.  Psychiatric: She has a normal mood and affect. Her behavior is normal. Judgment and thought content normal.  Nursing note and vitals reviewed.   ED Course  Procedures (including critical care time) Labs Review Labs Reviewed  COMPREHENSIVE METABOLIC PANEL - Abnormal; Notable for the following:    Potassium 3.3 (*)    Glucose, Bld 117 (*)    BUN <5 (*)    Creatinine, Ser 1.03 (*)    GFR calc non Af Amer 56 (*)    All other components within normal limits  CBC -  Abnormal; Notable for the following:    WBC 11.7 (*)    All other components within normal limits  URINALYSIS, ROUTINE W REFLEX MICROSCOPIC (NOT AT [facility]) - Abnormal; Notable for the following:    Specific Gravity, Urine <1.005 (*)    All other components within normal limits  LIPASE, BLOOD    Imaging Review Ct Abdomen Pelvis W Contrast  10/23/2015  CLINICAL DATA:  Abdominal pain and distention for 2 days EXAM: CT ABDOMEN AND PELVIS WITH CONTRAST TECHNIQUE: Multidetector CT imaging of the abdomen and pelvis was performed using the standard protocol following bolus administration of intravenous contrast. CONTRAST:  179mL OMNIPAQUE IOHEXOL 300 MG/ML  SOLN COMPARISON:  01/09/2011 FINDINGS: Lung bases are free of acute infiltrate or sizable effusion. The liver is diffusely decreased in attenuation consistent with fatty infiltration. The gallbladder, spleen, adrenal glands and pancreas are within normal limits. The kidneys are well visualized bilaterally and demonstrate a normal enhancement pattern with the exception of a cyst in the inferior aspect of the right kidney which is stable from the previous exam. Scattered small periaortic lymph nodes are noted. The appendix is within normal limits. The bladder is well distended. The uterus has been surgically removed. Minimal free pelvic fluid is noted. There are changes consistent with diverticulitis at the junction of the descending and sigmoid colons. Mild inflammatory changes are seen although no perforation or abscess formation is noted. The osseous structures show degenerative change of the lumbar spine. IMPRESSION: Changes consistent with sigmoid diverticulitis without evidence of perforation or abscess formation. Electronically Signed   By: Inez Catalina M.D.   On: 10/23/2015 17:54   I have personally reviewed and evaluated these images and lab results as part of my medical decision-making.   EKG Interpretation None      MDM   Final diagnoses:   None   Maureen Rodgers is a 65 y.o. female here with ab pain, vomiting. Worse LLQ tenderness. Consider diverticulitis vs UTI vs SBO. Will get labs, UA, CT ab/pel. Will hydrate and reassess.   8:37 PM CT showed uncomplicated diverticulitis. Able to keep down oral flagyl, cipro. Will dc home with cipro, flagyl, zofran, prn vicodin.     Wandra Arthurs, MD 10/23/15 2038

## 2015-10-23 NOTE — ED Notes (Signed)
Pt given urinal for specimen.  

## 2015-10-23 NOTE — Discharge Instructions (Signed)
Take cipro and flagyl as prescribed.  Take zofran for nausea. Stay hydrated.   Take motrin for pain. Take vicodin for severe pain, do not drive with it.   See your doctor./   Return to ER if you have severe pain, vomiting, dehydration, fever

## 2016-07-18 DIAGNOSIS — F419 Anxiety disorder, unspecified: Secondary | ICD-10-CM | POA: Diagnosis not present

## 2016-07-18 DIAGNOSIS — Z7189 Other specified counseling: Secondary | ICD-10-CM | POA: Diagnosis not present

## 2016-08-11 DIAGNOSIS — F331 Major depressive disorder, recurrent, moderate: Secondary | ICD-10-CM | POA: Diagnosis not present

## 2016-08-11 DIAGNOSIS — F411 Generalized anxiety disorder: Secondary | ICD-10-CM | POA: Diagnosis not present

## 2016-08-18 DIAGNOSIS — F419 Anxiety disorder, unspecified: Secondary | ICD-10-CM | POA: Diagnosis not present

## 2016-08-18 DIAGNOSIS — F331 Major depressive disorder, recurrent, moderate: Secondary | ICD-10-CM | POA: Diagnosis not present

## 2016-09-14 ENCOUNTER — Ambulatory Visit
Admission: RE | Admit: 2016-09-14 | Discharge: 2016-09-14 | Disposition: A | Discharge status: Home / Self Care | Payer: Medicare Other | Source: Ambulatory Visit | Attending: Internal Medicine | Admitting: Internal Medicine

## 2016-09-14 ENCOUNTER — Other Ambulatory Visit: Payer: Self-pay | Admitting: Internal Medicine

## 2016-09-14 DIAGNOSIS — Z1231 Encounter for screening mammogram for malignant neoplasm of breast: Secondary | ICD-10-CM

## 2016-09-14 DIAGNOSIS — E559 Vitamin D deficiency, unspecified: Secondary | ICD-10-CM | POA: Diagnosis not present

## 2016-09-14 DIAGNOSIS — R0982 Postnasal drip: Secondary | ICD-10-CM | POA: Diagnosis not present

## 2016-09-14 DIAGNOSIS — F17200 Nicotine dependence, unspecified, uncomplicated: Secondary | ICD-10-CM | POA: Diagnosis not present

## 2016-09-14 DIAGNOSIS — R001 Bradycardia, unspecified: Secondary | ICD-10-CM | POA: Diagnosis not present

## 2016-09-14 DIAGNOSIS — R7309 Other abnormal glucose: Secondary | ICD-10-CM | POA: Diagnosis not present

## 2016-09-14 DIAGNOSIS — Z0000 Encounter for general adult medical examination without abnormal findings: Secondary | ICD-10-CM | POA: Diagnosis not present

## 2016-09-21 DIAGNOSIS — Z6822 Body mass index (BMI) 22.0-22.9, adult: Secondary | ICD-10-CM | POA: Diagnosis not present

## 2016-09-21 DIAGNOSIS — Z01411 Encounter for gynecological examination (general) (routine) with abnormal findings: Secondary | ICD-10-CM | POA: Diagnosis not present

## 2016-09-21 DIAGNOSIS — D171 Benign lipomatous neoplasm of skin and subcutaneous tissue of trunk: Secondary | ICD-10-CM | POA: Diagnosis not present

## 2016-09-22 DIAGNOSIS — Z13820 Encounter for screening for osteoporosis: Secondary | ICD-10-CM | POA: Diagnosis not present

## 2016-09-22 DIAGNOSIS — E559 Vitamin D deficiency, unspecified: Secondary | ICD-10-CM | POA: Diagnosis not present

## 2016-10-03 HISTORY — PX: BILATERAL CARPAL TUNNEL RELEASE: SHX6508

## 2016-10-06 DIAGNOSIS — F411 Generalized anxiety disorder: Secondary | ICD-10-CM | POA: Diagnosis not present

## 2016-10-06 DIAGNOSIS — F331 Major depressive disorder, recurrent, moderate: Secondary | ICD-10-CM | POA: Diagnosis not present

## 2016-12-12 DIAGNOSIS — F411 Generalized anxiety disorder: Secondary | ICD-10-CM | POA: Diagnosis not present

## 2016-12-12 DIAGNOSIS — F331 Major depressive disorder, recurrent, moderate: Secondary | ICD-10-CM | POA: Diagnosis not present

## 2017-01-20 DIAGNOSIS — J209 Acute bronchitis, unspecified: Secondary | ICD-10-CM | POA: Diagnosis not present

## 2017-01-30 DIAGNOSIS — F411 Generalized anxiety disorder: Secondary | ICD-10-CM | POA: Diagnosis not present

## 2017-01-30 DIAGNOSIS — F331 Major depressive disorder, recurrent, moderate: Secondary | ICD-10-CM | POA: Diagnosis not present

## 2017-02-14 DIAGNOSIS — K5730 Diverticulosis of large intestine without perforation or abscess without bleeding: Secondary | ICD-10-CM | POA: Diagnosis not present

## 2017-02-14 DIAGNOSIS — R634 Abnormal weight loss: Secondary | ICD-10-CM | POA: Diagnosis not present

## 2017-02-14 DIAGNOSIS — K219 Gastro-esophageal reflux disease without esophagitis: Secondary | ICD-10-CM | POA: Diagnosis not present

## 2017-03-21 DIAGNOSIS — F411 Generalized anxiety disorder: Secondary | ICD-10-CM | POA: Diagnosis not present

## 2017-03-21 DIAGNOSIS — F331 Major depressive disorder, recurrent, moderate: Secondary | ICD-10-CM | POA: Diagnosis not present

## 2017-04-06 ENCOUNTER — Other Ambulatory Visit: Payer: Self-pay | Admitting: Gastroenterology

## 2017-04-06 DIAGNOSIS — R1032 Left lower quadrant pain: Secondary | ICD-10-CM | POA: Diagnosis not present

## 2017-04-06 DIAGNOSIS — Z86010 Personal history of colonic polyps: Secondary | ICD-10-CM | POA: Diagnosis not present

## 2017-04-06 DIAGNOSIS — K5732 Diverticulitis of large intestine without perforation or abscess without bleeding: Secondary | ICD-10-CM | POA: Diagnosis not present

## 2017-04-06 NOTE — Progress Notes (Signed)
Dacota Ruben MD 

## 2017-04-07 ENCOUNTER — Ambulatory Visit
Admission: RE | Admit: 2017-04-07 | Discharge: 2017-04-07 | Disposition: A | Discharge status: Home / Self Care | Payer: Medicare Other | Source: Ambulatory Visit | Attending: Gastroenterology | Admitting: Gastroenterology

## 2017-04-07 DIAGNOSIS — K5792 Diverticulitis of intestine, part unspecified, without perforation or abscess without bleeding: Secondary | ICD-10-CM | POA: Diagnosis not present

## 2017-04-07 DIAGNOSIS — R1032 Left lower quadrant pain: Secondary | ICD-10-CM

## 2017-04-07 MED ORDER — IOPAMIDOL (ISOVUE-300) INJECTION 61%
100.0000 mL | Freq: Once | INTRAVENOUS | Status: AC | PRN
  Administered 2017-04-07: 100 mL via INTRAVENOUS

## 2017-04-17 DIAGNOSIS — F411 Generalized anxiety disorder: Secondary | ICD-10-CM | POA: Diagnosis not present

## 2017-04-17 DIAGNOSIS — F331 Major depressive disorder, recurrent, moderate: Secondary | ICD-10-CM | POA: Diagnosis not present

## 2017-04-18 DIAGNOSIS — K5730 Diverticulosis of large intestine without perforation or abscess without bleeding: Secondary | ICD-10-CM | POA: Diagnosis not present

## 2017-04-18 DIAGNOSIS — Z800 Family history of malignant neoplasm of digestive organs: Secondary | ICD-10-CM | POA: Diagnosis not present

## 2017-04-18 DIAGNOSIS — Z86010 Personal history of colonic polyps: Secondary | ICD-10-CM | POA: Diagnosis not present

## 2017-04-18 DIAGNOSIS — K449 Diaphragmatic hernia without obstruction or gangrene: Secondary | ICD-10-CM | POA: Diagnosis not present

## 2017-04-24 DIAGNOSIS — F411 Generalized anxiety disorder: Secondary | ICD-10-CM | POA: Diagnosis not present

## 2017-04-24 DIAGNOSIS — F331 Major depressive disorder, recurrent, moderate: Secondary | ICD-10-CM | POA: Diagnosis not present

## 2017-05-01 DIAGNOSIS — F331 Major depressive disorder, recurrent, moderate: Secondary | ICD-10-CM | POA: Diagnosis not present

## 2017-05-01 DIAGNOSIS — F411 Generalized anxiety disorder: Secondary | ICD-10-CM | POA: Diagnosis not present

## 2017-05-08 DIAGNOSIS — F331 Major depressive disorder, recurrent, moderate: Secondary | ICD-10-CM | POA: Diagnosis not present

## 2017-05-08 DIAGNOSIS — F411 Generalized anxiety disorder: Secondary | ICD-10-CM | POA: Diagnosis not present

## 2017-05-22 DIAGNOSIS — F411 Generalized anxiety disorder: Secondary | ICD-10-CM | POA: Diagnosis not present

## 2017-05-22 DIAGNOSIS — F331 Major depressive disorder, recurrent, moderate: Secondary | ICD-10-CM | POA: Diagnosis not present

## 2017-06-12 DIAGNOSIS — F331 Major depressive disorder, recurrent, moderate: Secondary | ICD-10-CM | POA: Diagnosis not present

## 2017-06-12 DIAGNOSIS — F411 Generalized anxiety disorder: Secondary | ICD-10-CM | POA: Diagnosis not present

## 2017-06-13 DIAGNOSIS — F411 Generalized anxiety disorder: Secondary | ICD-10-CM | POA: Diagnosis not present

## 2017-06-13 DIAGNOSIS — F331 Major depressive disorder, recurrent, moderate: Secondary | ICD-10-CM | POA: Diagnosis not present

## 2017-06-22 DIAGNOSIS — Z720 Tobacco use: Secondary | ICD-10-CM | POA: Diagnosis not present

## 2017-06-22 DIAGNOSIS — J209 Acute bronchitis, unspecified: Secondary | ICD-10-CM | POA: Diagnosis not present

## 2017-08-08 DIAGNOSIS — F411 Generalized anxiety disorder: Secondary | ICD-10-CM | POA: Diagnosis not present

## 2017-08-08 DIAGNOSIS — F331 Major depressive disorder, recurrent, moderate: Secondary | ICD-10-CM | POA: Diagnosis not present

## 2017-08-31 ENCOUNTER — Other Ambulatory Visit: Payer: Self-pay | Admitting: Internal Medicine

## 2017-08-31 DIAGNOSIS — Z139 Encounter for screening, unspecified: Secondary | ICD-10-CM

## 2017-09-04 DIAGNOSIS — F331 Major depressive disorder, recurrent, moderate: Secondary | ICD-10-CM | POA: Diagnosis not present

## 2017-09-04 DIAGNOSIS — F411 Generalized anxiety disorder: Secondary | ICD-10-CM | POA: Diagnosis not present

## 2017-09-19 DIAGNOSIS — F411 Generalized anxiety disorder: Secondary | ICD-10-CM | POA: Diagnosis not present

## 2017-09-19 DIAGNOSIS — F331 Major depressive disorder, recurrent, moderate: Secondary | ICD-10-CM | POA: Diagnosis not present

## 2017-10-02 ENCOUNTER — Ambulatory Visit
Admission: RE | Admit: 2017-10-02 | Discharge: 2017-10-02 | Disposition: A | Discharge status: Home / Self Care | Payer: Medicare Other | Source: Ambulatory Visit | Attending: Internal Medicine | Admitting: Internal Medicine

## 2017-10-02 DIAGNOSIS — Z139 Encounter for screening, unspecified: Secondary | ICD-10-CM

## 2017-10-02 DIAGNOSIS — Z1231 Encounter for screening mammogram for malignant neoplasm of breast: Secondary | ICD-10-CM | POA: Diagnosis not present

## 2017-10-16 DIAGNOSIS — F411 Generalized anxiety disorder: Secondary | ICD-10-CM | POA: Diagnosis not present

## 2017-10-16 DIAGNOSIS — F331 Major depressive disorder, recurrent, moderate: Secondary | ICD-10-CM | POA: Diagnosis not present

## 2017-11-03 DIAGNOSIS — F331 Major depressive disorder, recurrent, moderate: Secondary | ICD-10-CM | POA: Diagnosis not present

## 2017-11-03 DIAGNOSIS — F411 Generalized anxiety disorder: Secondary | ICD-10-CM | POA: Diagnosis not present

## 2017-11-16 DIAGNOSIS — E2839 Other primary ovarian failure: Secondary | ICD-10-CM | POA: Diagnosis not present

## 2017-11-16 DIAGNOSIS — E559 Vitamin D deficiency, unspecified: Secondary | ICD-10-CM | POA: Diagnosis not present

## 2017-11-16 DIAGNOSIS — F331 Major depressive disorder, recurrent, moderate: Secondary | ICD-10-CM | POA: Diagnosis not present

## 2017-11-16 DIAGNOSIS — Z79899 Other long term (current) drug therapy: Secondary | ICD-10-CM | POA: Diagnosis not present

## 2017-11-16 DIAGNOSIS — Z0000 Encounter for general adult medical examination without abnormal findings: Secondary | ICD-10-CM | POA: Diagnosis not present

## 2017-11-16 DIAGNOSIS — R7309 Other abnormal glucose: Secondary | ICD-10-CM | POA: Diagnosis not present

## 2017-11-16 DIAGNOSIS — Z1389 Encounter for screening for other disorder: Secondary | ICD-10-CM | POA: Diagnosis not present

## 2017-11-16 DIAGNOSIS — R030 Elevated blood-pressure reading, without diagnosis of hypertension: Secondary | ICD-10-CM | POA: Diagnosis not present

## 2017-11-24 ENCOUNTER — Other Ambulatory Visit: Payer: Self-pay | Admitting: Internal Medicine

## 2017-11-24 DIAGNOSIS — E2839 Other primary ovarian failure: Secondary | ICD-10-CM

## 2017-12-01 ENCOUNTER — Other Ambulatory Visit: Payer: Medicare Other

## 2017-12-20 ENCOUNTER — Other Ambulatory Visit: Payer: Self-pay | Admitting: Internal Medicine

## 2017-12-20 DIAGNOSIS — E2839 Other primary ovarian failure: Secondary | ICD-10-CM

## 2017-12-28 DIAGNOSIS — F331 Major depressive disorder, recurrent, moderate: Secondary | ICD-10-CM | POA: Diagnosis not present

## 2017-12-28 DIAGNOSIS — F411 Generalized anxiety disorder: Secondary | ICD-10-CM | POA: Diagnosis not present

## 2018-01-10 ENCOUNTER — Ambulatory Visit
Admission: RE | Admit: 2018-01-10 | Discharge: 2018-01-10 | Disposition: A | Discharge status: Home / Self Care | Payer: Medicare Other | Source: Ambulatory Visit | Attending: Internal Medicine | Admitting: Internal Medicine

## 2018-01-10 DIAGNOSIS — M810 Age-related osteoporosis without current pathological fracture: Secondary | ICD-10-CM | POA: Diagnosis not present

## 2018-01-10 DIAGNOSIS — Z780 Asymptomatic menopausal state: Secondary | ICD-10-CM | POA: Diagnosis not present

## 2018-01-10 DIAGNOSIS — M85851 Other specified disorders of bone density and structure, right thigh: Secondary | ICD-10-CM | POA: Diagnosis not present

## 2018-01-10 DIAGNOSIS — E2839 Other primary ovarian failure: Secondary | ICD-10-CM

## 2018-01-18 DIAGNOSIS — F411 Generalized anxiety disorder: Secondary | ICD-10-CM | POA: Diagnosis not present

## 2018-01-18 DIAGNOSIS — F331 Major depressive disorder, recurrent, moderate: Secondary | ICD-10-CM | POA: Diagnosis not present

## 2018-02-02 DIAGNOSIS — F331 Major depressive disorder, recurrent, moderate: Secondary | ICD-10-CM | POA: Diagnosis not present

## 2018-02-02 DIAGNOSIS — F411 Generalized anxiety disorder: Secondary | ICD-10-CM | POA: Diagnosis not present

## 2018-02-02 DIAGNOSIS — J320 Chronic maxillary sinusitis: Secondary | ICD-10-CM | POA: Diagnosis not present

## 2018-03-30 DIAGNOSIS — W0110XA Fall on same level from slipping, tripping and stumbling with subsequent striking against unspecified object, initial encounter: Secondary | ICD-10-CM | POA: Diagnosis not present

## 2018-03-30 DIAGNOSIS — M25539 Pain in unspecified wrist: Secondary | ICD-10-CM | POA: Diagnosis not present

## 2018-03-30 DIAGNOSIS — Y92007 Garden or yard of unspecified non-institutional (private) residence as the place of occurrence of the external cause: Secondary | ICD-10-CM | POA: Diagnosis not present

## 2018-04-12 DIAGNOSIS — M65331 Trigger finger, right middle finger: Secondary | ICD-10-CM | POA: Diagnosis not present

## 2018-04-12 DIAGNOSIS — G5601 Carpal tunnel syndrome, right upper limb: Secondary | ICD-10-CM | POA: Diagnosis not present

## 2018-04-13 DIAGNOSIS — F411 Generalized anxiety disorder: Secondary | ICD-10-CM | POA: Diagnosis not present

## 2018-04-13 DIAGNOSIS — F331 Major depressive disorder, recurrent, moderate: Secondary | ICD-10-CM | POA: Diagnosis not present

## 2018-04-18 DIAGNOSIS — F331 Major depressive disorder, recurrent, moderate: Secondary | ICD-10-CM | POA: Diagnosis not present

## 2018-04-18 DIAGNOSIS — F411 Generalized anxiety disorder: Secondary | ICD-10-CM | POA: Diagnosis not present

## 2018-05-02 ENCOUNTER — Other Ambulatory Visit: Payer: Self-pay

## 2018-05-04 DIAGNOSIS — F331 Major depressive disorder, recurrent, moderate: Secondary | ICD-10-CM | POA: Diagnosis not present

## 2018-05-04 DIAGNOSIS — F411 Generalized anxiety disorder: Secondary | ICD-10-CM | POA: Diagnosis not present

## 2018-05-15 DIAGNOSIS — F17200 Nicotine dependence, unspecified, uncomplicated: Secondary | ICD-10-CM | POA: Diagnosis not present

## 2018-05-15 DIAGNOSIS — F331 Major depressive disorder, recurrent, moderate: Secondary | ICD-10-CM | POA: Diagnosis not present

## 2018-05-15 DIAGNOSIS — M810 Age-related osteoporosis without current pathological fracture: Secondary | ICD-10-CM | POA: Diagnosis not present

## 2018-05-15 DIAGNOSIS — Z79899 Other long term (current) drug therapy: Secondary | ICD-10-CM | POA: Diagnosis not present

## 2018-05-16 DIAGNOSIS — G5601 Carpal tunnel syndrome, right upper limb: Secondary | ICD-10-CM | POA: Diagnosis not present

## 2018-05-31 DIAGNOSIS — M65331 Trigger finger, right middle finger: Secondary | ICD-10-CM | POA: Diagnosis not present

## 2018-06-13 DIAGNOSIS — F411 Generalized anxiety disorder: Secondary | ICD-10-CM | POA: Diagnosis not present

## 2018-06-13 DIAGNOSIS — F331 Major depressive disorder, recurrent, moderate: Secondary | ICD-10-CM | POA: Diagnosis not present

## 2018-07-10 DIAGNOSIS — G5601 Carpal tunnel syndrome, right upper limb: Secondary | ICD-10-CM | POA: Diagnosis not present

## 2018-07-11 DIAGNOSIS — F411 Generalized anxiety disorder: Secondary | ICD-10-CM | POA: Diagnosis not present

## 2018-07-11 DIAGNOSIS — F331 Major depressive disorder, recurrent, moderate: Secondary | ICD-10-CM | POA: Diagnosis not present

## 2018-07-13 DIAGNOSIS — F411 Generalized anxiety disorder: Secondary | ICD-10-CM | POA: Diagnosis not present

## 2018-07-13 DIAGNOSIS — F331 Major depressive disorder, recurrent, moderate: Secondary | ICD-10-CM | POA: Diagnosis not present

## 2018-08-13 DIAGNOSIS — F411 Generalized anxiety disorder: Secondary | ICD-10-CM | POA: Diagnosis not present

## 2018-08-13 DIAGNOSIS — F331 Major depressive disorder, recurrent, moderate: Secondary | ICD-10-CM | POA: Diagnosis not present

## 2018-08-17 DIAGNOSIS — G5601 Carpal tunnel syndrome, right upper limb: Secondary | ICD-10-CM | POA: Diagnosis not present

## 2018-08-17 DIAGNOSIS — M65331 Trigger finger, right middle finger: Secondary | ICD-10-CM | POA: Diagnosis not present

## 2018-08-20 DIAGNOSIS — H524 Presbyopia: Secondary | ICD-10-CM | POA: Diagnosis not present

## 2018-08-20 DIAGNOSIS — Z961 Presence of intraocular lens: Secondary | ICD-10-CM | POA: Diagnosis not present

## 2018-08-20 DIAGNOSIS — H04123 Dry eye syndrome of bilateral lacrimal glands: Secondary | ICD-10-CM | POA: Diagnosis not present

## 2018-08-20 DIAGNOSIS — H5213 Myopia, bilateral: Secondary | ICD-10-CM | POA: Diagnosis not present

## 2018-08-20 DIAGNOSIS — H52203 Unspecified astigmatism, bilateral: Secondary | ICD-10-CM | POA: Diagnosis not present

## 2018-09-04 ENCOUNTER — Other Ambulatory Visit: Payer: Self-pay | Admitting: Nurse Practitioner

## 2018-09-04 DIAGNOSIS — M65331 Trigger finger, right middle finger: Secondary | ICD-10-CM | POA: Diagnosis not present

## 2018-09-04 DIAGNOSIS — G5601 Carpal tunnel syndrome, right upper limb: Secondary | ICD-10-CM | POA: Diagnosis not present

## 2018-09-06 DIAGNOSIS — Z86010 Personal history of colonic polyps: Secondary | ICD-10-CM | POA: Diagnosis not present

## 2018-09-06 DIAGNOSIS — K219 Gastro-esophageal reflux disease without esophagitis: Secondary | ICD-10-CM | POA: Diagnosis not present

## 2018-09-06 DIAGNOSIS — K5730 Diverticulosis of large intestine without perforation or abscess without bleeding: Secondary | ICD-10-CM | POA: Diagnosis not present

## 2018-09-06 DIAGNOSIS — Z800 Family history of malignant neoplasm of digestive organs: Secondary | ICD-10-CM | POA: Diagnosis not present

## 2018-09-06 DIAGNOSIS — R1032 Left lower quadrant pain: Secondary | ICD-10-CM | POA: Diagnosis not present

## 2018-09-07 ENCOUNTER — Other Ambulatory Visit: Payer: Self-pay | Admitting: Nurse Practitioner

## 2018-09-07 DIAGNOSIS — J309 Allergic rhinitis, unspecified: Secondary | ICD-10-CM

## 2018-09-07 MED ORDER — FLUTICASONE PROPIONATE 50 MCG/ACT NA SUSP
1.0000 | Freq: Every day | NASAL | 5 refills | Status: DC
Start: 2018-09-07 — End: 2024-04-17

## 2018-09-20 ENCOUNTER — Other Ambulatory Visit: Payer: Self-pay | Admitting: Nurse Practitioner

## 2018-09-27 DIAGNOSIS — Z6822 Body mass index (BMI) 22.0-22.9, adult: Secondary | ICD-10-CM | POA: Diagnosis not present

## 2018-09-27 DIAGNOSIS — Z01411 Encounter for gynecological examination (general) (routine) with abnormal findings: Secondary | ICD-10-CM | POA: Diagnosis not present

## 2018-09-27 DIAGNOSIS — Z124 Encounter for screening for malignant neoplasm of cervix: Secondary | ICD-10-CM | POA: Diagnosis not present

## 2018-10-19 DIAGNOSIS — F331 Major depressive disorder, recurrent, moderate: Secondary | ICD-10-CM | POA: Diagnosis not present

## 2018-10-19 DIAGNOSIS — F411 Generalized anxiety disorder: Secondary | ICD-10-CM | POA: Diagnosis not present

## 2018-10-20 ENCOUNTER — Other Ambulatory Visit: Payer: Self-pay | Admitting: Nurse Practitioner

## 2018-11-22 ENCOUNTER — Ambulatory Visit: Payer: Self-pay | Admitting: Internal Medicine

## 2018-11-22 ENCOUNTER — Ambulatory Visit: Payer: Self-pay

## 2018-11-22 ENCOUNTER — Telehealth: Payer: Self-pay

## 2018-11-22 NOTE — Telephone Encounter (Signed)
Called patient in regards to no show for AWV. She states she was not feeling well this morning and will need to reschedule. She stated that she will call back to reschedule appointment at a later date.

## 2018-12-04 ENCOUNTER — Telehealth: Payer: Self-pay | Admitting: Internal Medicine

## 2018-12-04 NOTE — Telephone Encounter (Signed)
Called to [facility] Medicare Annual Wellness Visit with the Nurse Health Advisor that was missed on 11/22/2018.  Patient states she will have to call back when she has her schedule and isn't driving.  Patient states she also needs to schedule an appointment for her Physical with Dr. Baird Cancer.  If patient returns call, please note: their last AWV was on 11/16/2017, please schedule AWV with [facility] AND PHYSICAL any date AFTER 11/16/2018.  Thank you! For any questions please contact: Janace Hoard at [phone] or Skype [email or web address]

## 2018-12-05 ENCOUNTER — Other Ambulatory Visit: Payer: Self-pay | Admitting: Internal Medicine

## 2018-12-05 DIAGNOSIS — Z1231 Encounter for screening mammogram for malignant neoplasm of breast: Secondary | ICD-10-CM

## 2018-12-06 DIAGNOSIS — G5601 Carpal tunnel syndrome, right upper limb: Secondary | ICD-10-CM | POA: Diagnosis not present

## 2018-12-06 DIAGNOSIS — M65331 Trigger finger, right middle finger: Secondary | ICD-10-CM | POA: Diagnosis not present

## 2018-12-06 DIAGNOSIS — M65332 Trigger finger, left middle finger: Secondary | ICD-10-CM | POA: Diagnosis not present

## 2018-12-07 ENCOUNTER — Ambulatory Visit
Admission: RE | Admit: 2018-12-07 | Discharge: 2018-12-07 | Disposition: A | Discharge status: Home / Self Care | Payer: Medicare Other | Source: Ambulatory Visit | Attending: Internal Medicine | Admitting: Internal Medicine

## 2018-12-07 DIAGNOSIS — Z1231 Encounter for screening mammogram for malignant neoplasm of breast: Secondary | ICD-10-CM

## 2018-12-12 NOTE — Telephone Encounter (Signed)
Spoke with patient and scheduled AWV-s for 12/20/2018 at 10:00 AM, and CPE for 12/24/2018 at 3:15 PM.  Janace Hoard, Care Guide.

## 2018-12-15 ENCOUNTER — Other Ambulatory Visit: Payer: Self-pay | Admitting: Nurse Practitioner

## 2018-12-17 ENCOUNTER — Other Ambulatory Visit: Payer: Self-pay | Admitting: Nurse Practitioner

## 2018-12-19 ENCOUNTER — Ambulatory Visit: Payer: Self-pay

## 2018-12-20 ENCOUNTER — Ambulatory Visit (INDEPENDENT_AMBULATORY_CARE_PROVIDER_SITE_OTHER): Payer: Medicare Other

## 2018-12-20 ENCOUNTER — Other Ambulatory Visit: Payer: Self-pay

## 2018-12-20 VITALS — BP 124/70 | HR 62 | Temp 97.9°F | Ht 63.0 in | Wt 119.6 lb

## 2018-12-20 DIAGNOSIS — Z0000 Encounter for general adult medical examination without abnormal findings: Secondary | ICD-10-CM | POA: Diagnosis not present

## 2018-12-20 NOTE — Patient Instructions (Signed)
Maureen Rodgers , Thank you for taking time to come for your Medicare Wellness Visit. I appreciate your ongoing commitment to your health goals. Please review the following plan we discussed and let me know if I can assist you in the future.   Screening recommendations/referrals: Colonoscopy: 03/2014 Mammogram: 12/2018 Bone Density: 01/2018 Recommended yearly ophthalmology/optometry visit for glaucoma screening and checkup Recommended yearly dental visit for hygiene and checkup  Vaccinations: Influenza vaccine: decline Pneumococcal vaccine: decline Tdap vaccine: 07/2013 Shingles vaccine: discussed    Advanced directives: Advance directive discussed with you today. I have provided a copy for you to complete at home and have notarized. Once this is complete please bring a copy in to our office so we can scan it into your chart.   Conditions/risks identified: smoking  Next appointment: 12/24/2018 at 315   Preventive Care 68 Years and Older, Female Preventive care refers to lifestyle choices and visits with your health care provider that can promote health and wellness. What does preventive care include?  A yearly physical exam. This is also called an annual well check.  Dental exams once or twice a year.  Routine eye exams. Ask your health care provider how often you should have your eyes checked.  Personal lifestyle choices, including:  Daily care of your teeth and gums.  Regular physical activity.  Eating a healthy diet.  Avoiding tobacco and drug use.  Limiting alcohol use.  Practicing safe sex.  Taking low-dose aspirin every day.  Taking vitamin and mineral supplements as recommended by your health care provider. What happens during an annual well check? The services and screenings done by your health care provider during your annual well check will depend on your age, overall health, lifestyle risk factors, and family history of disease. Counseling  Your health care  provider may ask you questions about your:  Alcohol use.  Tobacco use.  Drug use.  Emotional well-being.  Home and relationship well-being.  Sexual activity.  Eating habits.  History of falls.  Memory and ability to understand (cognition).  Work and work Statistician.  Reproductive health. Screening  You may have the following tests or measurements:  Height, weight, and BMI.  Blood pressure.  Lipid and cholesterol levels. These may be checked every 5 years, or more frequently if you are over 44 years old.  Skin check.  Lung cancer screening. You may have this screening every year starting at age 10 if you have a 30-pack-year history of smoking and currently smoke or have quit within the past 15 years.  Fecal occult blood test (FOBT) of the stool. You may have this test every year starting at age 73.  Flexible sigmoidoscopy or colonoscopy. You may have a sigmoidoscopy every 5 years or a colonoscopy every 10 years starting at age 21.  Hepatitis C blood test.  Hepatitis B blood test.  Sexually transmitted disease (STD) testing.  Diabetes screening. This is done by checking your blood sugar (glucose) after you have not eaten for a while (fasting). You may have this done every 1-3 years.  Bone density scan. This is done to screen for osteoporosis. You may have this done starting at age 69.  Mammogram. This may be done every 1-2 years. Talk to your health care provider about how often you should have regular mammograms. Talk with your health care provider about your test results, treatment options, and if necessary, the need for more tests. Vaccines  Your health care provider may recommend certain vaccines, such as:  Influenza vaccine. This is recommended every year.  Tetanus, diphtheria, and acellular pertussis (Tdap, Td) vaccine. You may need a Td booster every 10 years.  Zoster vaccine. You may need this after age 85.  Pneumococcal 13-valent conjugate (PCV13)  vaccine. One dose is recommended after age 5.  Pneumococcal polysaccharide (PPSV23) vaccine. One dose is recommended after age 14. Talk to your health care provider about which screenings and vaccines you need and how often you need them. This information is not intended to replace advice given to you by your health care provider. Make sure you discuss any questions you have with your health care provider. Document Released: 10/16/2015 Document Revised: 06/08/2016 Document Reviewed: 07/21/2015 Elsevier Interactive Patient Education  2017 [place] Prevention in the Home Falls can cause injuries. They can happen to people of all ages. There are many things you can do to make your home safe and to help prevent falls. What can I do on the outside of my home?  Regularly fix the edges of walkways and driveways and fix any cracks.  Remove anything that might make you trip as you walk through a door, such as a raised step or threshold.  Trim any bushes or trees on the path to your home.  Use bright outdoor lighting.  Clear any walking paths of anything that might make someone trip, such as rocks or tools.  Regularly check to see if handrails are loose or broken. Make sure that both sides of any steps have handrails.  Any raised decks and porches should have guardrails on the edges.  Have any leaves, snow, or ice cleared regularly.  Use sand or salt on walking paths during winter.  Clean up any spills in your garage right away. This includes oil or grease spills. What can I do in the bathroom?  Use night lights.  Install grab bars by the toilet and in the tub and shower. Do not use towel bars as grab bars.  Use non-skid mats or decals in the tub or shower.  If you need to sit down in the shower, use a plastic, non-slip stool.  Keep the floor dry. Clean up any water that spills on the floor as soon as it happens.  Remove soap buildup in the tub or shower regularly.   Attach bath mats securely with double-sided non-slip rug tape.  Do not have throw rugs and other things on the floor that can make you trip. What can I do in the bedroom?  Use night lights.  Make sure that you have a light by your bed that is easy to reach.  Do not use any sheets or blankets that are too big for your bed. They should not hang down onto the floor.  Have a firm chair that has side arms. You can use this for support while you get dressed.  Do not have throw rugs and other things on the floor that can make you trip. What can I do in the kitchen?  Clean up any spills right away.  Avoid walking on wet floors.  Keep items that you use a lot in easy-to-reach places.  If you need to reach something above you, use a strong step stool that has a grab bar.  Keep electrical cords out of the way.  Do not use floor polish or wax that makes floors slippery. If you must use wax, use non-skid floor wax.  Do not have throw rugs and other things on the floor that  can make you trip. What can I do with my stairs?  Do not leave any items on the stairs.  Make sure that there are handrails on both sides of the stairs and use them. Fix handrails that are broken or loose. Make sure that handrails are as long as the stairways.  Check any carpeting to make sure that it is firmly attached to the stairs. Fix any carpet that is loose or worn.  Avoid having throw rugs at the top or bottom of the stairs. If you do have throw rugs, attach them to the floor with carpet tape.  Make sure that you have a light switch at the top of the stairs and the bottom of the stairs. If you do not have them, ask someone to add them for you. What else can I do to help prevent falls?  Wear shoes that:  Do not have high heels.  Have rubber bottoms.  Are comfortable and fit you well.  Are closed at the toe. Do not wear sandals.  If you use a stepladder:  Make sure that it is fully opened. Do not climb  a closed stepladder.  Make sure that both sides of the stepladder are locked into place.  Ask someone to hold it for you, if possible.  Clearly mark and make sure that you can see:  Any grab bars or handrails.  First and last steps.  Where the edge of each step is.  Use tools that help you move around (mobility aids) if they are needed. These include:  Canes.  Walkers.  Scooters.  Crutches.  Turn on the lights when you go into a dark area. Replace any light bulbs as soon as they burn out.  Set up your furniture so you have a clear path. Avoid moving your furniture around.  If any of your floors are uneven, fix them.  If there are any pets around you, be aware of where they are.  Review your medicines with your doctor. Some medicines can make you feel dizzy. This can increase your chance of falling. Ask your doctor what other things that you can do to help prevent falls. This information is not intended to replace advice given to you by your health care provider. Make sure you discuss any questions you have with your health care provider. Document Released: 07/16/2009 Document Revised: 02/25/2016 Document Reviewed: 10/24/2014 Elsevier Interactive Patient Education  2017 [employer].

## 2018-12-20 NOTE — Progress Notes (Signed)
Subjective:   Maureen Rodgers is a 68 y.o. female who presents for Medicare Annual (Subsequent) preventive examination.  Review of Systems:  N/a  Cardiac Risk Factors include: advanced age (>57men, >53 women);sedentary lifestyle;smoking/ tobacco exposure     Objective:     Vitals: BP 124/70 (BP Location: Left Arm, Patient Position: Sitting)   Pulse 62   Temp 97.9 F (36.6 C) (Oral)   Ht 5\' 3"  (1.6 m)   Wt 119 lb 9.6 oz (54.3 kg)   BMI 21.19 kg/m   Body mass index is 21.19 kg/m.  Advanced Directives 12/20/2018 10/23/2015  Does Patient Have a Medical Advance Directive? No No  Would patient like information on creating a medical advance directive? Yes (MAU/Ambulatory/Procedural Areas - Information given) No - patient declined information    Tobacco Social History   Tobacco Use  Smoking Status Current Every Day Smoker  . Packs/day: 0.50  Smokeless Tobacco Never Used     Ready to quit: Not Answered Counseling given: Not Answered   Clinical Intake:  Pre-visit preparation completed: Yes  Pain : No/denies pain Pain Score: 0-No pain     Nutritional Status: BMI of 19-24  Normal Nutritional Risks: None Diabetes: No  How often do you need to have someone help you when you read instructions, pamphlets, or other written materials from your doctor or pharmacy?: 1 - Never What is the last grade level you completed in school?: college  Interpreter Needed?: No  Information entered by :: NAllen LPN  Past Medical History:  Diagnosis Date  . GERD (gastroesophageal reflux disease)    Past Surgical History:  Procedure Laterality Date  . ABDOMINAL HYSTERECTOMY    . EYE SURGERY     Family History  Problem Relation Age of Onset  . Heart disease Mother   . Dementia Mother   . Alcohol abuse Mother   . Alcohol abuse Father    Social History   Socioeconomic History  . Marital status: Single    Spouse name: Not on file  . Number of children: Not on file  . Years  of education: Not on file  . Highest education level: Not on file  Occupational History  . Not on file  Social Needs  . Financial resource strain: Not hard at all  . Food insecurity:    Worry: Never true    Inability: Never true  . Transportation needs:    Medical: No    Non-medical: No  Tobacco Use  . Smoking status: Current Every Day Smoker    Packs/day: 0.50  . Smokeless tobacco: Never Used  Substance and Sexual Activity  . Alcohol use: Yes    Alcohol/week: 3.0 standard drinks    Types: 3 Cans of beer per week    Comment: 3 beers a day  . Drug use: No  . Sexual activity: Not Currently  Lifestyle  . Physical activity:    Days per week: 0 days    Minutes per session: 0 min  . Stress: Not at all  Relationships  . Social connections:    Talks on phone: Not on file    Gets together: Not on file    Attends religious service: Not on file    Active member of club or organization: Not on file    Attends meetings of clubs or organizations: Not on file    Relationship status: Not on file  Other Topics Concern  . Not on file  Social History Narrative  . Not on  file    Outpatient Encounter Medications as of 12/20/2018  Medication Sig  . alendronate (FOSAMAX) 70 MG tablet TAKE 1 TABLET BY MOUTH WEEKLY  . ALPRAZolam (XANAX) 0.5 MG tablet Take 0.5 mg by mouth at bedtime as needed for sleep (anxiety).  Marland Kitchen esomeprazole (NEXIUM) 40 MG capsule Take 40 mg by mouth daily before breakfast.  . fluticasone (FLONASE) 50 MCG/ACT nasal spray Place 1 spray into both nostrils daily.  . fluticasone (FLONASE) 50 MCG/ACT nasal spray Place 2 sprays into both nostrils daily for 30 days.  . Omega-3 Fatty Acids (FISH OIL) 1000 MG CAPS Take 1,000 mg by mouth daily.  Marland Kitchen PROAIR HFA 108 (90 Base) MCG/ACT inhaler INHALE 2 PUFFS EVERY 4-6 HOURS  . traZODone (DESYREL) 50 MG tablet Take 50 mg by mouth at bedtime.  . Vitamin D, Ergocalciferol, (DRISDOL) 50000 UNITS CAPS capsule Take 50,000 Units by mouth See  admin instructions. Pt takes twice weekly. Tuesday and Friday  . ciprofloxacin (CIPRO) 500 MG tablet Take 1 tablet (500 mg total) by mouth 2 (two) times daily. One po bid x 7 days (Patient not taking: Reported on 12/20/2018)  . fluticasone (FLONASE) 50 MCG/ACT nasal spray SHAKE LIQUID AND USE 1 SPRAY IN EACH NOSTRIL EVERY DAY  . HYDROcodone-acetaminophen (NORCO/VICODIN) 5-325 MG tablet Take 1 tablet by mouth every 6 (six) hours as needed. (Patient not taking: Reported on 12/20/2018)  . metroNIDAZOLE (FLAGYL) 500 MG tablet Take 1 tablet (500 mg total) by mouth 3 (three) times daily. (Patient not taking: Reported on 12/20/2018)  . ondansetron (ZOFRAN ODT) 4 MG disintegrating tablet 4mg  ODT q4 hours prn nausea/vomit (Patient not taking: Reported on 12/20/2018)   No facility-administered encounter medications on file as of 12/20/2018.     Activities of Daily Living In your present state of health, do you have any difficulty performing the following activities: 12/20/2018  Hearing? N  Vision? Y  Comment need new glasses  Difficulty concentrating or making decisions? Y  Comment does not address a lot of things  Walking or climbing stairs? N  Dressing or bathing? N  Doing errands, shopping? N  Preparing Food and eating ? N  Using the Toilet? N  In the past six months, have you accidently leaked urine? Y  Do you have problems with loss of bowel control? N  Managing your Medications? N  Managing your Finances? N  Housekeeping or managing your Housekeeping? N  Some recent data might be hidden    Patient Care Team: Glendale Chard, MD as PCP - General (Internal Medicine)    Assessment:   This is a routine wellness examination for [place].  Exercise Activities and Dietary recommendations Current Exercise Habits: The patient does not participate in regular exercise at present  Goals    . Patient Stated (pt-stated)     Wants to stop drinking and to gain weight       Fall Risk Fall Risk   12/20/2018 05/02/2018  Falls in the past year? 1 Yes  Comment - Emmi Telephone Survey: data to providers prior to load  Number falls in past yr: 0 2 or more  Comment tripped over root Emmi Telephone Survey Actual Response = 2  Injury with Fall? 1 Yes  Comment had surgery on right hand -  Risk for fall due to : History of fall(s);Medication side effect -  Follow up Falls prevention discussed;Education provided -   Is the patient's home free of loose throw rugs in walkways, pet beds, electrical cords, etc?  yes      Grab bars in the bathroom? no      Handrails on the stairs?   yes      Adequate lighting?   yes  Timed Get Up and Go performed: n/a  Depression Screen PHQ 2/9 Scores 12/20/2018  PHQ - 2 Score 5  PHQ- 9 Score 13     Cognitive Function         There is no immunization history on file for this patient.  Qualifies for Shingles Vaccine? yes  Screening Tests Health Maintenance  Topic Date Due  . Hepatitis C Screening  Aug 11, 1951  . TETANUS/TDAP  03/07/1970  . COLONOSCOPY  03/07/2001  . PNA vac Low Risk Adult (1 of 2 - PCV13) 03/07/2016  . INFLUENZA VACCINE  05/03/2018  . MAMMOGRAM  12/06/2020  . DEXA SCAN  Completed    Cancer Screenings: Lung: Low Dose CT Chest recommended if Age 67-80 years, 30 pack-year currently smoking OR have quit w/in 15years. Patient does qualify. Breast:  Up to date on Mammogram? Yes   Up to date of Bone Density/Dexa? Yes Colorectal: up to date  Additional Screenings: : Hepatitis C Screening: 04/15/2013 <0.1     Plan:    Patient wants to stop drinking and gain back some weight.   I have personally reviewed and noted the following in the patient's chart:   . Medical and social history . Use of alcohol, tobacco or illicit drugs  . Current medications and supplements . Functional ability and status . Nutritional status . Physical activity . Advanced directives . List of other physicians . Hospitalizations, surgeries, and ER  visits in previous 12 months . Vitals . Screenings to include cognitive, depression, and falls . Referrals and appointments  In addition, I have reviewed and discussed with patient certain preventive protocols, quality metrics, and best practice recommendations. A written personalized care plan for preventive services as well as general preventive health recommendations were provided to patient.     Kellie Simmering, LPN  9/79/8921

## 2018-12-24 ENCOUNTER — Other Ambulatory Visit: Payer: Self-pay

## 2018-12-24 ENCOUNTER — Encounter: Payer: Self-pay | Admitting: Nurse Practitioner

## 2018-12-24 ENCOUNTER — Ambulatory Visit (INDEPENDENT_AMBULATORY_CARE_PROVIDER_SITE_OTHER): Payer: Medicare Other | Admitting: Nurse Practitioner

## 2018-12-24 VITALS — BP 140/88 | HR 54 | Temp 97.5°F | Ht 62.8 in | Wt 122.4 lb

## 2018-12-24 DIAGNOSIS — E782 Mixed hyperlipidemia: Secondary | ICD-10-CM | POA: Diagnosis not present

## 2018-12-24 DIAGNOSIS — R001 Bradycardia, unspecified: Secondary | ICD-10-CM | POA: Diagnosis not present

## 2018-12-24 DIAGNOSIS — J209 Acute bronchitis, unspecified: Secondary | ICD-10-CM | POA: Diagnosis not present

## 2018-12-24 DIAGNOSIS — R7309 Other abnormal glucose: Secondary | ICD-10-CM | POA: Diagnosis not present

## 2018-12-24 DIAGNOSIS — Z0000 Encounter for general adult medical examination without abnormal findings: Secondary | ICD-10-CM | POA: Diagnosis not present

## 2018-12-24 LAB — POCT URINALYSIS DIPSTICK
Bilirubin, UA: NEGATIVE
Blood, UA: NEGATIVE
Glucose, UA: NEGATIVE
Ketones, UA: NEGATIVE
Leukocytes, UA: NEGATIVE
NITRITE UA: NEGATIVE
PH UA: 7 (ref 5.0–8.0)
Protein, UA: NEGATIVE
Spec Grav, UA: 1.015 (ref 1.010–1.025)
Urobilinogen, UA: 0.2 E.U./dL

## 2018-12-24 MED ORDER — HYDROCODONE-HOMATROPINE 5-1.5 MG/5ML PO SYRP: 5.0000 mL | ORAL_SOLUTION | Freq: Four times a day (QID) | ORAL | 0 refills | Status: DC | PRN

## 2018-12-24 MED ORDER — PREDNISONE 10 MG (21) PO TBPK: ORAL_TABLET | ORAL | 0 refills | Status: DC

## 2018-12-24 NOTE — Progress Notes (Addendum)
Subjective:     Patient ID: Maureen Rodgers , female    DOB: 1951-05-30 , 68 y.o.   MRN: [id number]   Chief Complaint  Patient presents with  . Health Maintenance   The patient states she uses status post hysterectomy for birth control. Last LMP was No LMP recorded. Patient has had a hysterectomy.. Negative for Dysmenorrhea and Negative for Menorrhagia Mammogram last done 12/10/2018 Negative for: breast discharge, breast lump(s), breast pain and breast self exam.  Pertinent negatives include abnormal bleeding (hematology), anxiety, decreased libido, depression, difficulty falling sleep, dyspareunia, history of infertility, nocturia, sexual dysfunction, sleep disturbances, urinary incontinence, urinary urgency, vaginal discharge and vaginal itching. Diet regular. The patient states her exercise level is  she is working in her yard.  She is being treated for depression, she is dealing with losing her mom and her niece passed from suicide.      The patient's tobacco use is:  Social History   Tobacco Use  Smoking Status Current Every Day Smoker  . Packs/day: 0.50  Smokeless Tobacco Never Used   She has been exposed to passive smoke. The patient's alcohol use is:  Social History   Substance and Sexual Activity  Alcohol Use Yes  . Alcohol/week: 3.0 standard drinks  . Types: 3 Cans of beer per week   Comment: 3 beers a day   Additional information: Last pap (hysterectomy) HPI  Here for HM  Cough  This is a recurrent problem. The current episode started 1 to 4 weeks ago. The problem has been waxing and waning. The problem occurs constantly. The cough is non-productive. Associated symptoms include weight loss. Pertinent negatives include no ear congestion. Nothing aggravates the symptoms. She has tried nothing for the symptoms. The treatment provided no relief. There is no history of asthma or pneumonia.     Past Medical History:  Diagnosis Date  . GERD (gastroesophageal reflux disease)       Family History  Problem Relation Age of Onset  . Heart disease Mother   . Dementia Mother   . Alcohol abuse Mother   . Alcohol abuse Father      Current Outpatient Medications:  .  alendronate (FOSAMAX) 70 MG tablet, TAKE 1 TABLET BY MOUTH WEEKLY, Disp: 12 tablet, Rfl: 3 .  ALPRAZolam (XANAX) 0.5 MG tablet, Take 0.5 mg by mouth at bedtime as needed for sleep (anxiety)., Disp: , Rfl:  .  esomeprazole (NEXIUM) 40 MG capsule, Take 40 mg by mouth daily before breakfast., Disp: , Rfl:  .  fluticasone (FLONASE) 50 MCG/ACT nasal spray, Place 1 spray into both nostrils daily., Disp: 16 g, Rfl: 5 .  Omega-3 Fatty Acids (FISH OIL) 1000 MG CAPS, Take 1,000 mg by mouth daily., Disp: , Rfl:  .  sertraline (ZOLOFT) 100 MG tablet, Take 100 mg by mouth daily., Disp: , Rfl:  .  traZODone (DESYREL) 50 MG tablet, Take 50 mg by mouth at bedtime., Disp: , Rfl:  .  Vitamin D, Ergocalciferol, (DRISDOL) 50000 UNITS CAPS capsule, Take 50,000 Units by mouth See admin instructions. Pt takes twice weekly. Tuesday and Friday, Disp: , Rfl:  .  ciprofloxacin (CIPRO) 500 MG tablet, Take 1 tablet (500 mg total) by mouth 2 (two) times daily. One po bid x 7 days (Patient not taking: Reported on 12/20/2018), Disp: 14 tablet, Rfl: 0 .  fluticasone (FLONASE) 50 MCG/ACT nasal spray, SHAKE LIQUID AND USE 1 SPRAY IN EACH NOSTRIL EVERY DAY, Disp: 48 g, Rfl: 0 .  fluticasone (  FLONASE) 50 MCG/ACT nasal spray, Place 2 sprays into both nostrils daily for 30 days. (Patient not taking: Reported on 12/24/2018), Disp: 48 g, Rfl: 1 .  HYDROcodone-acetaminophen (NORCO/VICODIN) 5-325 MG tablet, Take 1 tablet by mouth every 6 (six) hours as needed. (Patient not taking: Reported on 12/20/2018), Disp: 10 tablet, Rfl: 0 .  HYDROcodone-homatropine (HYDROMET) 5-1.5 MG/5ML syrup, Take 5 mLs by mouth every 6 (six) hours as needed., Disp: 120 mL, Rfl: 0 .  metroNIDAZOLE (FLAGYL) 500 MG tablet, Take 1 tablet (500 mg total) by mouth 3 (three) times  daily. (Patient not taking: Reported on 12/20/2018), Disp: 21 tablet, Rfl: 0 .  ondansetron (ZOFRAN ODT) 4 MG disintegrating tablet, 76m ODT q4 hours prn nausea/vomit (Patient not taking: Reported on 12/24/2018), Disp: 10 tablet, Rfl: 0 .  predniSONE (STERAPRED UNI-PAK 21 TAB) 10 MG (21) TBPK tablet, Take as directed, Disp: 21 tablet, Rfl: 0 .  PROAIR HFA 108 (90 Base) MCG/ACT inhaler, INHALE 2 PUFFS EVERY 4-6 HOURS, Disp: , Rfl: 0   No Known Allergies   Review of Systems  Constitutional: Positive for weight loss.  HENT: Negative.   Eyes: Negative.   Respiratory: Positive for cough.   Cardiovascular: Negative.   Gastrointestinal: Negative.   Endocrine: Negative.   Genitourinary: Negative.   Musculoskeletal: Negative.   Skin: Negative.   Allergic/Immunologic: Negative.   Neurological: Negative.   Hematological: Negative.   Psychiatric/Behavioral: Negative.      Today's Vitals   12/24/18 1538  BP: 140/88  Pulse: (!) 54  Temp: (!) 97.5 F (36.4 C)  TempSrc: Oral  SpO2: 98%  Weight: 122 lb 6.4 oz (55.5 kg)  Height: 5' 2.8" (1.595 m)   Body mass index is 21.82 kg/m.   Objective:  Physical Exam Vitals signs reviewed.  Constitutional:      Appearance: Normal appearance. She is well-developed.  HENT:     Head: Normocephalic and atraumatic.     Right Ear: Hearing, tympanic membrane, ear canal and external ear normal.     Left Ear: Hearing, tympanic membrane, ear canal and external ear normal.     Nose: Nose normal.     Mouth/Throat:     Mouth: Mucous membranes are moist.  Eyes:     General: Lids are normal.     Conjunctiva/sclera: Conjunctivae normal.     Pupils: Pupils are equal, round, and reactive to light.     Funduscopic exam:    Right eye: No papilledema.        Left eye: No papilledema.  Neck:     Musculoskeletal: Full passive range of motion without pain, normal range of motion and neck supple.     Thyroid: No thyroid mass.     Vascular: No carotid bruit.   Cardiovascular:     Rate and Rhythm: Normal rate and regular rhythm.     Pulses: Normal pulses.     Heart sounds: Normal heart sounds. No murmur.  Pulmonary:     Effort: Pulmonary effort is normal.     Breath sounds: Normal breath sounds.  Abdominal:     General: Abdomen is flat. Bowel sounds are normal.     Palpations: Abdomen is soft.  Musculoskeletal: Normal range of motion.        General: No swelling.     Right lower leg: No edema.     Left lower leg: No edema.  Skin:    General: Skin is warm and dry.     Capillary Refill: Capillary refill takes less  than 2 seconds.  Neurological:     General: No focal deficit present.     Mental Status: She is alert and oriented to person, place, and time.     Cranial Nerves: No cranial nerve deficit.     Sensory: No sensory deficit.  Psychiatric:        Mood and Affect: Mood normal.        Behavior: Behavior normal.        Thought Content: Thought content normal.        Judgment: Judgment normal.          Assessment And Plan:     1. Health maintenance examination  Pt's annual wellness exam was performed and geriatric assessment reviewed.   Pt has no new identiafble wellness concerns at this time.   WIll obtain routine labs.   Will obtain UA and micro.   Behavior modifications discussed and diet history reviewed. Pt will continue to exercise regularly and modify diet, with low GI, plant based foods and decrease food intake of processed foods.   Recommend intake of daily multivitamin, Vitamin D, and calcium.  Recommond mammogram for preventive screenings, as well as recommend immunizations that include influenza (up to date) and TDAP  2. Acute bronchitis, unspecified organism  Hacking cough   3 week history of cough not improving much  Will treat empirically as to not get worse - predniSONE (STERAPRED UNI-PAK 21 TAB) 10 MG (21) TBPK tablet; Take as directed  Dispense: 21 tablet; Refill: 0 - HYDROcodone-homatropine  (HYDROMET) 5-1.5 MG/5ML syrup; Take 5 mLs by mouth every 6 (six) hours as needed.  Dispense: 120 mL; Refill: 0  3. Abnormal glucose  Chronic, stable  No current medications  Encouraged to limit intake of sugary foods and drinks  Encouraged to increase physical activity to 150 minutes per week as tolerated - Hemoglobin A1c - Lipid Profile - BMP8+eGFR  4. Mixed hyperlipidemia  Chronic, controlled  Continue with current medications  - Lipid Profile - BMP8+eGFR  5. Bradycardia  Asymptomatic, will continue to monitor.   If begin with symptoms will consider referral to cardiology for further evaluation - EKG 12-Lead      Minette Brine, FNP

## 2018-12-24 NOTE — Patient Instructions (Signed)

## 2019-01-15 DIAGNOSIS — F411 Generalized anxiety disorder: Secondary | ICD-10-CM | POA: Diagnosis not present

## 2019-01-15 DIAGNOSIS — F331 Major depressive disorder, recurrent, moderate: Secondary | ICD-10-CM | POA: Diagnosis not present

## 2019-01-31 DIAGNOSIS — F411 Generalized anxiety disorder: Secondary | ICD-10-CM | POA: Diagnosis not present

## 2019-01-31 DIAGNOSIS — F331 Major depressive disorder, recurrent, moderate: Secondary | ICD-10-CM | POA: Diagnosis not present

## 2019-02-12 DIAGNOSIS — F331 Major depressive disorder, recurrent, moderate: Secondary | ICD-10-CM | POA: Diagnosis not present

## 2019-02-12 DIAGNOSIS — F411 Generalized anxiety disorder: Secondary | ICD-10-CM | POA: Diagnosis not present

## 2019-03-11 DIAGNOSIS — F411 Generalized anxiety disorder: Secondary | ICD-10-CM | POA: Diagnosis not present

## 2019-03-11 DIAGNOSIS — F331 Major depressive disorder, recurrent, moderate: Secondary | ICD-10-CM | POA: Diagnosis not present

## 2019-03-26 DIAGNOSIS — F331 Major depressive disorder, recurrent, moderate: Secondary | ICD-10-CM | POA: Diagnosis not present

## 2019-03-26 DIAGNOSIS — F411 Generalized anxiety disorder: Secondary | ICD-10-CM | POA: Diagnosis not present

## 2019-04-01 DIAGNOSIS — Z86010 Personal history of colonic polyps: Secondary | ICD-10-CM | POA: Diagnosis not present

## 2019-04-01 DIAGNOSIS — D122 Benign neoplasm of ascending colon: Secondary | ICD-10-CM | POA: Diagnosis not present

## 2019-04-01 DIAGNOSIS — Z1211 Encounter for screening for malignant neoplasm of colon: Secondary | ICD-10-CM | POA: Diagnosis not present

## 2019-04-01 DIAGNOSIS — D126 Benign neoplasm of colon, unspecified: Secondary | ICD-10-CM | POA: Diagnosis not present

## 2019-04-01 DIAGNOSIS — D125 Benign neoplasm of sigmoid colon: Secondary | ICD-10-CM | POA: Diagnosis not present

## 2019-04-01 DIAGNOSIS — D124 Benign neoplasm of descending colon: Secondary | ICD-10-CM | POA: Diagnosis not present

## 2019-04-01 DIAGNOSIS — K635 Polyp of colon: Secondary | ICD-10-CM | POA: Diagnosis not present

## 2019-04-01 LAB — HM COLONOSCOPY

## 2019-04-09 DIAGNOSIS — F331 Major depressive disorder, recurrent, moderate: Secondary | ICD-10-CM | POA: Diagnosis not present

## 2019-04-09 DIAGNOSIS — F411 Generalized anxiety disorder: Secondary | ICD-10-CM | POA: Diagnosis not present

## 2019-04-17 ENCOUNTER — Encounter: Payer: Self-pay | Admitting: Internal Medicine

## 2019-04-19 ENCOUNTER — Encounter: Payer: Self-pay | Admitting: Internal Medicine

## 2019-05-21 DIAGNOSIS — F331 Major depressive disorder, recurrent, moderate: Secondary | ICD-10-CM | POA: Diagnosis not present

## 2019-05-21 DIAGNOSIS — F411 Generalized anxiety disorder: Secondary | ICD-10-CM | POA: Diagnosis not present

## 2019-06-05 DIAGNOSIS — F331 Major depressive disorder, recurrent, moderate: Secondary | ICD-10-CM | POA: Diagnosis not present

## 2019-06-05 DIAGNOSIS — F411 Generalized anxiety disorder: Secondary | ICD-10-CM | POA: Diagnosis not present

## 2019-06-07 DIAGNOSIS — F411 Generalized anxiety disorder: Secondary | ICD-10-CM | POA: Diagnosis not present

## 2019-06-07 DIAGNOSIS — F331 Major depressive disorder, recurrent, moderate: Secondary | ICD-10-CM | POA: Diagnosis not present

## 2019-06-26 ENCOUNTER — Encounter: Payer: Self-pay | Admitting: Internal Medicine

## 2019-06-26 ENCOUNTER — Other Ambulatory Visit: Payer: Self-pay

## 2019-06-26 ENCOUNTER — Ambulatory Visit (INDEPENDENT_AMBULATORY_CARE_PROVIDER_SITE_OTHER): Payer: Medicare Other | Admitting: Internal Medicine

## 2019-06-26 VITALS — BP 130/72 | HR 64 | Temp 98.4°F | Ht 61.6 in | Wt 120.4 lb

## 2019-06-26 DIAGNOSIS — M545 Low back pain: Secondary | ICD-10-CM | POA: Diagnosis not present

## 2019-06-26 DIAGNOSIS — Z23 Encounter for immunization: Secondary | ICD-10-CM

## 2019-06-26 DIAGNOSIS — Z79899 Other long term (current) drug therapy: Secondary | ICD-10-CM

## 2019-06-26 DIAGNOSIS — M810 Age-related osteoporosis without current pathological fracture: Secondary | ICD-10-CM | POA: Diagnosis not present

## 2019-06-26 DIAGNOSIS — G8929 Other chronic pain: Secondary | ICD-10-CM

## 2019-06-26 DIAGNOSIS — F331 Major depressive disorder, recurrent, moderate: Secondary | ICD-10-CM

## 2019-06-26 DIAGNOSIS — Z716 Tobacco abuse counseling: Secondary | ICD-10-CM | POA: Diagnosis not present

## 2019-06-26 MED ORDER — ZOSTER VAC RECOMB ADJUVANTED 50 MCG/0.5ML IM SUSR: 0.5000 mL | Freq: Once | INTRAMUSCULAR | 0 refills | Status: AC

## 2019-06-26 MED ORDER — DULOXETINE HCL 30 MG PO CPEP: 30.0000 mg | ORAL_CAPSULE | Freq: Every day | ORAL | 2 refills | Status: DC

## 2019-06-26 NOTE — Patient Instructions (Signed)

## 2019-06-27 ENCOUNTER — Telehealth: Payer: Self-pay | Admitting: Internal Medicine

## 2019-06-27 DIAGNOSIS — F331 Major depressive disorder, recurrent, moderate: Secondary | ICD-10-CM | POA: Diagnosis not present

## 2019-06-27 DIAGNOSIS — F411 Generalized anxiety disorder: Secondary | ICD-10-CM | POA: Diagnosis not present

## 2019-06-27 LAB — CMP14+EGFR
ALT: 16 IU/L (ref 0–32)
AST: 23 IU/L (ref 0–40)
Albumin/Globulin Ratio: 1.9 (ref 1.2–2.2)
Albumin: 4.2 g/dL (ref 3.8–4.8)
Alkaline Phosphatase: 62 IU/L (ref 39–117)
BUN/Creatinine Ratio: 8 — ABNORMAL LOW (ref 12–28)
BUN: 7 mg/dL — ABNORMAL LOW (ref 8–27)
Bilirubin Total: 0.4 mg/dL (ref 0.0–1.2)
CO2: 22 mmol/L (ref 20–29)
Calcium: 9.6 mg/dL (ref 8.7–10.3)
Chloride: 102 mmol/L (ref 96–106)
Creatinine, Ser: 0.84 mg/dL (ref 0.57–1.00)
GFR calc Af Amer: 83 mL/min/{1.73_m2} (ref >59)
GFR calc non Af Amer: 72 mL/min/{1.73_m2} (ref >59)
Globulin, Total: 2.2 g/dL (ref 1.5–4.5)
Glucose: 89 mg/dL (ref 65–99)
Potassium: 4.2 mmol/L (ref 3.5–5.2)
Sodium: 140 mmol/L (ref 134–144)
Total Protein: 6.4 g/dL (ref 6.0–8.5)

## 2019-06-27 LAB — HEPATITIS C ANTIBODY: Hep C Virus Ab: <0.1 s/co ratio (ref 0.0–0.9)

## 2019-06-27 NOTE — Chronic Care Management (AMB) (Signed)
°  Chronic Care Management   Outreach Note  06/27/2019 Name: [name] MRN: [id number] DOB: 11-20-1950  Referred by: Glendale Chard, MD Reason for referral : Chronic Care Management (Initial CCM outreach was unsuccessful )   An unsuccessful telephone outreach was attempted today. The patient was referred to the case management team by for assistance with chronic care management and care coordination.   Follow Up Plan: A HIPPA compliant phone message was left for the patient providing contact information and requesting a return call.  The care management team will reach out to the patient again over the next 7 days.  If patient returns call to provider office, please advise to call [facility]  [email or web address]   ??[phone]

## 2019-06-28 ENCOUNTER — Telehealth: Payer: Self-pay

## 2019-06-28 NOTE — Telephone Encounter (Signed)
-----   Message from Glendale Chard, MD sent at 06/27/2019  6:26 PM EDT ----- Here are your lab results:  You are negative for hepatitis c virus.  Your liver and kidney function are stable. Please let me know if you have any questions.   Sincerely,    Robyn N. Baird Cancer, MD

## 2019-06-28 NOTE — Telephone Encounter (Signed)
1st attempt to give lab result  

## 2019-06-30 NOTE — Progress Notes (Signed)
Subjective:     Patient ID: Maureen Rodgers , female    DOB: 08-08-1951 , 68 y.o.   MRN: [id number]   Chief Complaint  Patient presents with  . Back Pain    HPI  Back Pain This is a chronic problem. The current episode started more than 1 year ago. The problem occurs intermittently. The problem is unchanged. The pain is present in the lumbar spine. The quality of the pain is described as aching. The pain does not radiate. The pain is at a severity of 5/10. The pain is moderate. The symptoms are aggravated by lying down and sitting. Pertinent negatives include no abdominal pain, bladder incontinence, bowel incontinence, leg pain, pelvic pain or perianal numbness. She has tried analgesics for the symptoms. The treatment provided mild relief.     Past Medical History:  Diagnosis Date  . GERD (gastroesophageal reflux disease)   . Tobacco use      Family History  Problem Relation Age of Onset  . Heart disease Mother   . Dementia Mother   . Alcohol abuse Mother   . Alcohol abuse Father      Current Outpatient Medications:  .  alendronate (FOSAMAX) 70 MG tablet, TAKE 1 TABLET BY MOUTH WEEKLY, Disp: 12 tablet, Rfl: 3 .  ALPRAZolam (XANAX) 0.5 MG tablet, Take 0.5 mg by mouth at bedtime as needed for sleep (anxiety)., Disp: , Rfl:  .  esomeprazole (NEXIUM) 40 MG capsule, Take 40 mg by mouth daily before breakfast., Disp: , Rfl:  .  fluticasone (FLONASE) 50 MCG/ACT nasal spray, Place 1 spray into both nostrils daily., Disp: 16 g, Rfl: 5 .  Omega-3 Fatty Acids (FISH OIL) 1000 MG CAPS, Take 1,000 mg by mouth daily., Disp: , Rfl:  .  PROAIR HFA 108 (90 Base) MCG/ACT inhaler, INHALE 2 PUFFS EVERY 4-6 HOURS, Disp: , Rfl: 0 .  traZODone (DESYREL) 50 MG tablet, Take 50 mg by mouth at bedtime., Disp: , Rfl:  .  Vitamin D, Ergocalciferol, (DRISDOL) 50000 UNITS CAPS capsule, Take 50,000 Units by mouth See admin instructions. Pt takes twice weekly. Tuesday and Friday, Disp: , Rfl:  .  DULoxetine  (CYMBALTA) 30 MG capsule, Take 1 capsule (30 mg total) by mouth daily., Disp: 30 capsule, Rfl: 2 .  fluticasone (FLONASE) 50 MCG/ACT nasal spray, Place 2 sprays into both nostrils daily for 30 days. (Patient not taking: Reported on 12/24/2018), Disp: 48 g, Rfl: 1 .  HYDROcodone-homatropine (HYDROMET) 5-1.5 MG/5ML syrup, Take 5 mLs by mouth every 6 (six) hours as needed. (Patient not taking: Reported on 06/26/2019), Disp: 120 mL, Rfl: 0   No Known Allergies   Review of Systems  Constitutional: Negative.   Respiratory: Negative.   Cardiovascular: Negative.   Gastrointestinal: Negative.  Negative for abdominal pain and bowel incontinence.  Genitourinary: Negative for bladder incontinence and pelvic pain.  Musculoskeletal: Positive for back pain.  Neurological: Negative.   Psychiatric/Behavioral: Positive for dysphoric mood.     Today's Vitals   06/26/19 1441  BP: 130/72  Pulse: 64  Temp: 98.4 F (36.9 C)  TempSrc: Oral  SpO2: 98%  Weight: 120 lb 6.4 oz (54.6 kg)  Height: 5' 1.6" (1.565 m)   Body mass index is 22.31 kg/m.   Objective:  Physical Exam Vitals signs and nursing note reviewed.  Constitutional:      Appearance: Normal appearance.  HENT:     Head: Normocephalic and atraumatic.  Cardiovascular:     Rate and Rhythm: Normal rate and  regular rhythm.     Heart sounds: Normal heart sounds.  Pulmonary:     Effort: Pulmonary effort is normal.     Breath sounds: Normal breath sounds.  Musculoskeletal:     Lumbar back: She exhibits tenderness and spasm.  Skin:    General: Skin is warm.  Neurological:     General: No focal deficit present.     Mental Status: She is alert.  Psychiatric:        Mood and Affect: Mood normal.        Behavior: Behavior normal.         Assessment And Plan:     1. Chronic bilateral low back pain without sciatica  I will start her on duloxetine, 55m daily. She is advised to take between dinner and bedtime. Possible side effects were  discussed with the patient. She will rto in four to six weeks for re=evauation.   2. Age-related osteoporosis without current pathological fracture  Most recent dexa scan results reviewed. She is encouraged to take calcium and vitamin D supplements and to engage in weightbearing exercises two to three days per week.   3. Moderate episode of recurrent major depressive disorder (HCC)  Chronic. PHQ-9 performed. She is still grieving loss of her niece and mother.  Pt advised that duloxetine may also help with her mood.   4. Encounter for tobacco use cessation counseling  Smoking cessation instruction/counseling given:  counseled patient on the dangers of tobacco use, advised patient to stop smoking, and reviewed strategies to maximize success  5. Flu vaccine need  She was given high dose flu vaccine to update her immunization history.   6. Drug therapy  - Hepatitis C antibody - CMP14+EGFR     RMaximino Greenland MD    THE PATIENT IS ENCOURAGED TO PRACTICE SOCIAL DISTANCING DUE TO THE COVID-19 PANDEMIC.

## 2019-07-01 NOTE — Chronic Care Management (AMB) (Signed)
Chronic Care Management   Note  07/01/2019 Name: LILYIAN QUAYLE MRN: [id number] DOB: May 14, 1951  Maureen Rodgers is a 68 y.o. year old female who is a primary care patient of Glendale Chard, MD. I reached out to Tula Nakayama by phone today in response to a referral sent by Ms. Pamalee Marcoe Landau's patient's health plan.     Ms. Robley was given information about Chronic Care Management services today including:  1. CCM service includes personalized support from designated clinical staff supervised by her physician, including individualized plan of care and coordination with other care providers 2. 24/7 contact phone numbers for assistance for urgent and routine care needs. 3. Service will only be billed when office clinical staff spend 20 minutes or more in a month to coordinate care. 4. Only one practitioner may furnish and bill the service in a calendar month. 5. The patient may stop CCM services at any time (effective at the end of the month) by phone call to the office staff. 6. The patient will be responsible for cost sharing (co-pay) of up to 20% of the service fee (after annual deductible is met).  Patient did not agree to enrollment in care management services and does not wish to consider at this time.  Follow up plan: The patient has been provided with contact information for the chronic care management team and has been advised to call with any health related questions or concerns.   Miller  ??[email or web address]   ??[phone]

## 2019-07-17 DIAGNOSIS — F331 Major depressive disorder, recurrent, moderate: Secondary | ICD-10-CM | POA: Diagnosis not present

## 2019-07-17 DIAGNOSIS — F411 Generalized anxiety disorder: Secondary | ICD-10-CM | POA: Diagnosis not present

## 2019-07-30 DIAGNOSIS — F331 Major depressive disorder, recurrent, moderate: Secondary | ICD-10-CM | POA: Diagnosis not present

## 2019-07-30 DIAGNOSIS — F411 Generalized anxiety disorder: Secondary | ICD-10-CM | POA: Diagnosis not present

## 2019-08-14 ENCOUNTER — Other Ambulatory Visit: Payer: Self-pay

## 2019-08-14 ENCOUNTER — Ambulatory Visit (INDEPENDENT_AMBULATORY_CARE_PROVIDER_SITE_OTHER): Payer: Medicare Other | Admitting: Internal Medicine

## 2019-08-14 ENCOUNTER — Encounter: Payer: Self-pay | Admitting: Internal Medicine

## 2019-08-14 VITALS — BP 130/70 | HR 80 | Temp 98.9°F | Ht 61.6 in | Wt 120.8 lb

## 2019-08-14 DIAGNOSIS — M545 Low back pain: Secondary | ICD-10-CM

## 2019-08-14 DIAGNOSIS — G8929 Other chronic pain: Secondary | ICD-10-CM

## 2019-08-14 DIAGNOSIS — Z716 Tobacco abuse counseling: Secondary | ICD-10-CM

## 2019-08-14 DIAGNOSIS — F331 Major depressive disorder, recurrent, moderate: Secondary | ICD-10-CM | POA: Diagnosis not present

## 2019-08-14 MED ORDER — DULOXETINE HCL 30 MG PO CPEP: 30.0000 mg | ORAL_CAPSULE | Freq: Every day | ORAL | 1 refills | Status: DC

## 2019-08-14 NOTE — Patient Instructions (Addendum)
Eye doctors: Dr. Marylynn Pearson, Dr. Katy Fitch, Dr. Herbert Deaner   Coping with Quitting Smoking  Quitting smoking is a physical and mental challenge. You will face cravings, withdrawal symptoms, and temptation. Before quitting, work with your health care provider to make a plan that can help you cope. Preparation can help you quit and keep you from giving in. How can I cope with cravings? Cravings usually last for 5-10 minutes. If you get through it, the craving will pass. Consider taking the following actions to help you cope with cravings:  Keep your mouth busy: ? Chew sugar-free gum. ? Suck on hard candies or a straw. ? Brush your teeth.  Keep your hands and body busy: ? Immediately change to a different activity when you feel a craving. ? Squeeze or play with a ball. ? Do an activity or a hobby, like making bead jewelry, practicing needlepoint, or working with wood. ? Mix up your normal routine. ? Take a short exercise break. Go for a quick walk or run up and down stairs. ? Spend time in public places where smoking is not allowed.  Focus on doing something kind or helpful for someone else.  Call a friend or family member to talk during a craving.  Join a support group.  Call a quit line, such as 1-800-QUIT-NOW.  Talk with your health care provider about medicines that might help you cope with cravings and make quitting easier for you. How can I deal with withdrawal symptoms? Your body may experience negative effects as it tries to get used to not having nicotine in the system. These effects are called withdrawal symptoms. They may include:  Feeling hungrier than normal.  Trouble concentrating.  Irritability.  Trouble sleeping.  Feeling depressed.  Restlessness and agitation.  Craving a cigarette. To manage withdrawal symptoms:  Avoid places, people, and activities that trigger your cravings.  Remember why you want to quit.  Get plenty of sleep.  Avoid coffee and other  caffeinated drinks. These may worsen some of your symptoms. How can I handle social situations? Social situations can be difficult when you are quitting smoking, especially in the first few weeks. To manage this, you can:  Avoid parties, bars, and other social situations where people might be smoking.  Avoid alcohol.  Leave right away if you have the urge to smoke.  Explain to your family and friends that you are quitting smoking. Ask for understanding and support.  Plan activities with friends or family where smoking is not an option. What are some ways I can cope with stress? Wanting to smoke may cause stress, and stress can make you want to smoke. Find ways to manage your stress. Relaxation techniques can help. For example:  Breathe slowly and deeply, in through your nose and out through your mouth.  Listen to soothing, relaxing music.  Talk with a family member or friend about your stress.  Light a candle.  Soak in a bath or take a shower.  Think about a peaceful place. What are some ways I can prevent weight gain? Be aware that many people gain weight after they quit smoking. However, not everyone does. To keep from gaining weight, have a plan in place before you quit and stick to the plan after you quit. Your plan should include:  Having healthy snacks. When you have a craving, it may help to: ? Eat plain popcorn, crunchy carrots, celery, or other cut vegetables. ? Chew sugar-free gum.  Changing how you eat: ?  Eat small portion sizes at meals. ? Eat 4-6 small meals throughout the day instead of 1-2 large meals a day. ? Be mindful when you eat. Do not watch television or do other things that might distract you as you eat.  Exercising regularly: ? Make time to exercise each day. If you do not have time for a long workout, do short bouts of exercise for 5-10 minutes several times a day. ? Do some form of strengthening exercise, like weight lifting, and some form of aerobic  exercise, like running or swimming.  Drinking plenty of water or other low-calorie or no-calorie drinks. Drink 6-8 glasses of water daily, or as much as instructed by your health care provider. Summary  Quitting smoking is a physical and mental challenge. You will face cravings, withdrawal symptoms, and temptation to smoke again. Preparation can help you as you go through these challenges.  You can cope with cravings by keeping your mouth busy (such as by chewing gum), keeping your body and hands busy, and making calls to family, friends, or a helpline for people who want to quit smoking.  You can cope with withdrawal symptoms by avoiding places where people smoke, avoiding drinks with caffeine, and getting plenty of rest.  Ask your health care provider about the different ways to prevent weight gain, avoid stress, and handle social situations. This information is not intended to replace advice given to you by your health care provider. Make sure you discuss any questions you have with your health care provider. Document Released: 09/16/2016 Document Revised: 09/01/2017 Document Reviewed: 09/16/2016 Elsevier Patient Education  2020 [employer].

## 2019-08-26 DIAGNOSIS — Z961 Presence of intraocular lens: Secondary | ICD-10-CM | POA: Diagnosis not present

## 2019-08-31 NOTE — Progress Notes (Signed)
Subjective:     Patient ID: Maureen Rodgers , female    DOB: 1951-06-25 , 68 y.o.   MRN: [id number]   Chief Complaint  Patient presents with  . Back Pain  . Depression    cymbalta fu    HPI  She is here today for f/u back pain. She was started on duloxetine at her last visit to address the chronic pain. She admits that she has noticed that it has helped to decrease the severity of the pain.   Back Pain This is a chronic problem. The current episode started more than 1 year ago. The problem has been gradually improving since onset. The pain is present in the lumbar spine. The pain does not radiate. The pain is at a severity of 6/10. The pain is moderate. Pertinent negatives include no abdominal pain, bladder incontinence, bowel incontinence, leg pain or pelvic pain.     Past Medical History:  Diagnosis Date  . GERD (gastroesophageal reflux disease)   . Tobacco use      Family History  Problem Relation Age of Onset  . Heart disease Mother   . Dementia Mother   . Alcohol abuse Mother   . Alcohol abuse Father   . Diabetes Brother      Current Outpatient Medications:  .  alendronate (FOSAMAX) 70 MG tablet, TAKE 1 TABLET BY MOUTH WEEKLY, Disp: 12 tablet, Rfl: 3 .  ALPRAZolam (XANAX) 0.5 MG tablet, Take 0.5 mg by mouth at bedtime as needed for sleep (anxiety)., Disp: , Rfl:  .  DULoxetine (CYMBALTA) 30 MG capsule, Take 1 capsule (30 mg total) by mouth daily., Disp: 90 capsule, Rfl: 1 .  esomeprazole (NEXIUM) 40 MG capsule, Take 40 mg by mouth daily before breakfast., Disp: , Rfl:  .  fluticasone (FLONASE) 50 MCG/ACT nasal spray, Place 1 spray into both nostrils daily., Disp: 16 g, Rfl: 5 .  Omega-3 Fatty Acids (FISH OIL) 1000 MG CAPS, Take 1,000 mg by mouth daily., Disp: , Rfl:  .  PROAIR HFA 108 (90 Base) MCG/ACT inhaler, INHALE 2 PUFFS EVERY 4-6 HOURS, Disp: , Rfl: 0 .  traZODone (DESYREL) 50 MG tablet, Take 50 mg by mouth at bedtime., Disp: , Rfl:  .  Vitamin D,  Ergocalciferol, (DRISDOL) 50000 UNITS CAPS capsule, Take 50,000 Units by mouth See admin instructions. Pt takes twice weekly. Tuesday and Friday, Disp: , Rfl:  .  fluticasone (FLONASE) 50 MCG/ACT nasal spray, Place 2 sprays into both nostrils daily for 30 days. (Patient not taking: Reported on 12/24/2018), Disp: 48 g, Rfl: 1 .  HYDROcodone-homatropine (HYDROMET) 5-1.5 MG/5ML syrup, Take 5 mLs by mouth every 6 (six) hours as needed. (Patient not taking: Reported on 06/26/2019), Disp: 120 mL, Rfl: 0   No Known Allergies   Review of Systems  Constitutional: Negative.   Respiratory: Negative.   Cardiovascular: Negative.   Gastrointestinal: Negative.  Negative for abdominal pain and bowel incontinence.  Genitourinary: Negative for bladder incontinence and pelvic pain.  Musculoskeletal: Positive for back pain.  Neurological: Negative.      Today's Vitals   08/14/19 1410  BP: 130/70  Pulse: 80  Temp: 98.9 F (37.2 C)  TempSrc: Oral  Weight: 120 lb 12.8 oz (54.8 kg)  Height: 5' 1.6" (1.565 m)   Body mass index is 22.38 kg/m.   Objective:  Physical Exam Vitals signs and nursing note reviewed.  Constitutional:      Appearance: Normal appearance.  HENT:     Head: Normocephalic and  atraumatic.  Cardiovascular:     Rate and Rhythm: Normal rate and regular rhythm.     Heart sounds: Normal heart sounds.  Pulmonary:     Effort: Pulmonary effort is normal.     Breath sounds: Normal breath sounds.  Skin:    General: Skin is warm.  Neurological:     General: No focal deficit present.     Mental Status: She is alert.  Psychiatric:        Mood and Affect: Mood normal.        Behavior: Behavior normal.         Assessment And Plan:     1. Chronic bilateral low back pain without sciatica  Chronic. Improved with use of duloxetine 30mg  daily. A 90-day rx was sent to the pharmacy. She does not wish to increase dose at this time.   2. Moderate episode of recurrent major depressive  disorder (HCC)  Chronic. She is currently stable.   3. Encounter for tobacco use cessation counseling  Smoking cessation instruction/counseling given:  counseled patient on the dangers of tobacco use, advised patient to stop smoking, and reviewed strategies to maximize success  [name], MD    THE PATIENT IS ENCOURAGED TO PRACTICE SOCIAL DISTANCING DUE TO THE COVID-19 PANDEMIC.

## 2019-09-11 DIAGNOSIS — F331 Major depressive disorder, recurrent, moderate: Secondary | ICD-10-CM | POA: Diagnosis not present

## 2019-09-11 DIAGNOSIS — F411 Generalized anxiety disorder: Secondary | ICD-10-CM | POA: Diagnosis not present

## 2019-10-16 DIAGNOSIS — F331 Major depressive disorder, recurrent, moderate: Secondary | ICD-10-CM | POA: Diagnosis not present

## 2019-10-16 DIAGNOSIS — F411 Generalized anxiety disorder: Secondary | ICD-10-CM | POA: Diagnosis not present

## 2019-10-25 DIAGNOSIS — F411 Generalized anxiety disorder: Secondary | ICD-10-CM | POA: Diagnosis not present

## 2019-10-25 DIAGNOSIS — F331 Major depressive disorder, recurrent, moderate: Secondary | ICD-10-CM | POA: Diagnosis not present

## 2019-11-02 ENCOUNTER — Other Ambulatory Visit: Payer: Self-pay | Admitting: Nurse Practitioner

## 2019-11-15 DIAGNOSIS — F331 Major depressive disorder, recurrent, moderate: Secondary | ICD-10-CM | POA: Diagnosis not present

## 2019-11-15 DIAGNOSIS — F411 Generalized anxiety disorder: Secondary | ICD-10-CM | POA: Diagnosis not present

## 2019-12-13 DIAGNOSIS — F411 Generalized anxiety disorder: Secondary | ICD-10-CM | POA: Diagnosis not present

## 2019-12-13 DIAGNOSIS — F331 Major depressive disorder, recurrent, moderate: Secondary | ICD-10-CM | POA: Diagnosis not present

## 2019-12-24 ENCOUNTER — Encounter: Payer: Self-pay | Admitting: Internal Medicine

## 2019-12-24 DIAGNOSIS — Z1231 Encounter for screening mammogram for malignant neoplasm of breast: Secondary | ICD-10-CM | POA: Diagnosis not present

## 2019-12-24 DIAGNOSIS — M810 Age-related osteoporosis without current pathological fracture: Secondary | ICD-10-CM | POA: Diagnosis not present

## 2019-12-24 DIAGNOSIS — Z1239 Encounter for other screening for malignant neoplasm of breast: Secondary | ICD-10-CM | POA: Diagnosis not present

## 2019-12-24 LAB — HM PAP SMEAR

## 2019-12-24 LAB — HM MAMMOGRAPHY

## 2019-12-24 LAB — HM DEXA SCAN: HM Dexa Scan: POSITIVE

## 2020-01-01 ENCOUNTER — Ambulatory Visit: Payer: Medicare Other | Admitting: Internal Medicine

## 2020-01-01 ENCOUNTER — Ambulatory Visit: Payer: Medicare Other

## 2020-01-16 ENCOUNTER — Ambulatory Visit: Payer: Medicare Other

## 2020-01-16 ENCOUNTER — Ambulatory Visit: Payer: Medicare Other | Admitting: Internal Medicine

## 2020-01-22 DIAGNOSIS — F411 Generalized anxiety disorder: Secondary | ICD-10-CM | POA: Diagnosis not present

## 2020-01-22 DIAGNOSIS — F331 Major depressive disorder, recurrent, moderate: Secondary | ICD-10-CM | POA: Diagnosis not present

## 2020-01-23 DIAGNOSIS — F411 Generalized anxiety disorder: Secondary | ICD-10-CM | POA: Diagnosis not present

## 2020-01-23 DIAGNOSIS — F331 Major depressive disorder, recurrent, moderate: Secondary | ICD-10-CM | POA: Diagnosis not present

## 2020-01-30 ENCOUNTER — Telehealth: Payer: Self-pay

## 2020-01-30 NOTE — Telephone Encounter (Signed)
PT LVM REQ TO [place] [facility] FROM 4/15.Marland KitchenATT TO CONTACT PT NO ANS LVM TO CALL OFC

## 2020-02-12 ENCOUNTER — Encounter: Payer: Self-pay | Admitting: Internal Medicine

## 2020-02-12 ENCOUNTER — Other Ambulatory Visit: Payer: Self-pay

## 2020-02-12 ENCOUNTER — Ambulatory Visit (INDEPENDENT_AMBULATORY_CARE_PROVIDER_SITE_OTHER): Payer: Medicare Other | Admitting: Internal Medicine

## 2020-02-12 ENCOUNTER — Ambulatory Visit (INDEPENDENT_AMBULATORY_CARE_PROVIDER_SITE_OTHER): Payer: Medicare Other

## 2020-02-12 VITALS — BP 138/90 | HR 96 | Temp 98.3°F | Ht 62.0 in | Wt 116.2 lb

## 2020-02-12 DIAGNOSIS — M65341 Trigger finger, right ring finger: Secondary | ICD-10-CM | POA: Diagnosis not present

## 2020-02-12 DIAGNOSIS — Z0000 Encounter for general adult medical examination without abnormal findings: Secondary | ICD-10-CM

## 2020-02-12 DIAGNOSIS — Z23 Encounter for immunization: Secondary | ICD-10-CM

## 2020-02-12 DIAGNOSIS — M5416 Radiculopathy, lumbar region: Secondary | ICD-10-CM

## 2020-02-12 DIAGNOSIS — F331 Major depressive disorder, recurrent, moderate: Secondary | ICD-10-CM

## 2020-02-12 DIAGNOSIS — R030 Elevated blood-pressure reading, without diagnosis of hypertension: Secondary | ICD-10-CM

## 2020-02-12 MED ORDER — PREVNAR 13 IM SUSP: 0.5000 mL | INTRAMUSCULAR | 0 refills | Status: AC

## 2020-02-12 MED ORDER — BOOSTRIX 5-2.5-18.5 LF-MCG/0.5 IM SUSP: 0.5000 mL | Freq: Once | INTRAMUSCULAR | 0 refills | Status: AC

## 2020-02-12 NOTE — Progress Notes (Signed)
This visit occurred during the SARS-CoV-2 public health emergency.  Safety protocols were in place, including screening questions prior to the visit, additional usage of staff PPE, and extensive cleaning of exam room while observing appropriate contact time as indicated for disinfecting solutions.  Subjective:     Patient ID: Maureen Rodgers , female    DOB: Mar 10, 1951 , 69 y.o.   MRN: [id number]   Chief Complaint  Patient presents with  . Other    elevated bp    HPI  She is here today for f/u elevated blood pressure. She has h/o HTN, she has been off of meds for several years. She denies headaches, chest pain and shortness of breath.     Past Medical History:  Diagnosis Date  . GERD (gastroesophageal reflux disease)   . Tobacco use      Family History  Problem Relation Age of Onset  . Heart disease Mother   . Dementia Mother   . Alcohol abuse Mother   . Alcohol abuse Father   . Diabetes Brother      Current Outpatient Medications:  .  alendronate (FOSAMAX) 70 MG tablet, TAKE 1 TABLET BY MOUTH ONE TIME PER WEEK, Disp: 12 tablet, Rfl: 3 .  ALPRAZolam (XANAX) 0.5 MG tablet, Take 0.5 mg by mouth at bedtime as needed for sleep (anxiety)., Disp: , Rfl:  .  DULoxetine (CYMBALTA) 30 MG capsule, Take 1 capsule (30 mg total) by mouth daily., Disp: 90 capsule, Rfl: 1 .  esomeprazole (NEXIUM) 40 MG capsule, Take 40 mg by mouth daily before breakfast., Disp: , Rfl:  .  fluticasone (FLONASE) 50 MCG/ACT nasal spray, Place 1 spray into both nostrils daily., Disp: 16 g, Rfl: 5 .  fluticasone (FLONASE) 50 MCG/ACT nasal spray, Place 2 sprays into both nostrils daily for 30 days. (Patient not taking: Reported on 12/24/2018), Disp: 48 g, Rfl: 1 .  HYDROcodone-homatropine (HYDROMET) 5-1.5 MG/5ML syrup, Take 5 mLs by mouth every 6 (six) hours as needed. (Patient not taking: Reported on 06/26/2019), Disp: 120 mL, Rfl: 0 .  Omega-3 Fatty Acids (FISH OIL) 1000 MG CAPS, Take 1,000 mg by mouth daily.,  Disp: , Rfl:  .  PROAIR HFA 108 (90 Base) MCG/ACT inhaler, INHALE 2 PUFFS EVERY 4-6 HOURS, Disp: , Rfl: 0 .  traZODone (DESYREL) 50 MG tablet, Take 50 mg by mouth at bedtime., Disp: , Rfl:  .  Vitamin D, Ergocalciferol, (DRISDOL) 50000 UNITS CAPS capsule, Take 50,000 Units by mouth See admin instructions. Pt takes twice weekly. Tuesday and Friday, Disp: , Rfl:    No Known Allergies   Review of Systems  Constitutional: Negative.   Respiratory: Negative.   Cardiovascular: Negative.   Gastrointestinal: Negative.   Musculoskeletal: Positive for back pain.       She c/o back pain. Radiation to b/l LE.   Neurological: Negative.   Psychiatric/Behavioral: Negative.      Today's Vitals   02/12/20 1454  BP: 138/90  Pulse: 96  Temp: 98.3 F (36.8 C)  TempSrc: Oral  Weight: 116 lb 3.2 oz (52.7 kg)  Height: 5' 2"  (1.575 m)  PainSc: 0-No pain   Body mass index is 21.25 kg/m.   Wt Readings from Last 3 Encounters:  02/12/20 116 lb 3.2 oz (52.7 kg)  02/12/20 116 lb 3.2 oz (52.7 kg)  08/14/19 120 lb 12.8 oz (54.8 kg)   BP Readings from Last 3 Encounters:  02/12/20 138/90  02/12/20 138/90  08/14/19 130/70    Objective:  Physical  Exam Vitals and nursing note reviewed.  Constitutional:      Appearance: Normal appearance.  HENT:     Head: Normocephalic and atraumatic.  Cardiovascular:     Rate and Rhythm: Normal rate and regular rhythm.     Heart sounds: Normal heart sounds.  Pulmonary:     Effort: Pulmonary effort is normal.     Breath sounds: Normal breath sounds.  Musculoskeletal:     Comments: Neg straight leg test  Skin:    General: Skin is warm.  Neurological:     General: No focal deficit present.     Mental Status: She is alert.  Psychiatric:        Mood and Affect: Mood normal.        Behavior: Behavior normal.         Assessment And Plan:     1. Elevated blood pressure reading  She does not wish to start meds at this time. She is encouraged to avoid  adding salt to her foods. She was given information regarding DASh eating plan. I will check renal function today. She will rto in six to eight weeks for re-evaluation.   - CMP14+EGFR  2. Moderate episode of recurrent major depressive disorder (HCC)  Chronic, yet stable. She reports compliance with meds.  I will check thyroid function today.   - TSH  3. Lumbar radiculopathy  Chronic. I will refer her for MRI. I will make further recommendations once her MRI results are available for review.   - MR Lumbar Spine Wo Contrast; Future  4. Trigger ring finger of right hand  She agrees to Ortho referral to hand specialist. She reports being seen at [facility] in the past.   - Ambulatory referral to [facility], [place]    THE PATIENT IS ENCOURAGED TO PRACTICE SOCIAL DISTANCING DUE TO THE COVID-19 PANDEMIC.

## 2020-02-12 NOTE — Patient Instructions (Signed)
Maureen Rodgers , Thank you for taking time to come for your Medicare Wellness Visit. I appreciate your ongoing commitment to your health goals. Please review the following plan we discussed and let me know if I can assist you in the future.   Screening recommendations/referrals: Colonoscopy: 03/2019 Mammogram: 12/2019 Bone Density: 12/2019 Recommended yearly ophthalmology/optometry visit for glaucoma screening and checkup Recommended yearly dental visit for hygiene and checkup  Vaccinations: Influenza vaccine: 06/2019 Pneumococcal vaccine: sent to pharmacy Tdap vaccine: sent to pharmacy Shingles vaccine: discussed    Advanced directives: Advance directive discussed with you today. I have provided a copy for you to complete at home and have notarized. Once this is complete please bring a copy in to our office so we can scan it into your chart.  Conditions/risks identified: smoking  Next appointment: 02/17/2021 at 2:15  Smoking and Musculoskeletal Health Smoking is bad for your health. Most people know that smoking causes lung disease, heart disease, and cancer. But people may not realize that it also affects their bones, muscles, and joints (musculoskeletal system). When you smoke, the effects on your lungs and heart result in less oxygen for your musculoskeletal system. This can lead to poor bone and joint health. How can smoking affect my musculoskeletal health? Smoking can:  Increase your risk of having weak, thin bones (osteoporosis). Elderly smokers are at higher risk for bone fractures related to osteoporosis.  Decrease the ability of bone-forming cells to make and replace bone (in addition to reducing oxygen and blood flow).  Reduce your body's ability to absorb calcium from your diet. Less calcium means weaker bones.  Interfere with the breakdown of the female hormone estrogen. Smoking lowers estrogen, which is a hormone that helps keep bones strong. Women who smoke may have earlier  menopause. Menopause is a risk factor for osteoporosis.  Weaken the tissues that attach bones to muscles (tendons). This can lead to shoulder, back, and other joint injuries.  Increase your risk of rheumatoid arthritis or make the condition worse if you already have it.  Slow down healing and increase your risk of infection and other complications if you have a bone fracture or surgery that involves your musculoskeletal system.  Make you get out of breath easily. This can keep you from getting the exercise you need to keep your bones and joints healthy.  Decrease your appetite and body mass. You may lose weight and muscle strength. This can put you at higher risk for muscle injury, joint injury, and broken bones. What actions can I take to prevent musculoskeletal problems? Quit smoking      Do not start smoking. Quit if you already do. Even stopping later in life can improve musculoskeletal health.  Do not use any products that contain nicotine or tobacco. Do not replace cigarette smoking with e-cigarettes. The safety of e-cigarettes is not known, and some may contain harmful chemicals.  Make a plan to quit smoking and commit to it. Look for programs to help you, and ask your health care provider for recommendations and ideas.  Talk with your health care provider about using nicotine replacement medicines to help you quit, such as gum, lozenges, patches, sprays, or pills. Make other lifestyle changes   Eat a healthy diet that includes calcium and vitamin D. These nutrients are important for bone health. ? Calcium is found in dairy foods and green leafy vegetables. ? Vitamin D is found in eggs, fish, and liver. ? Many foods also have vitamin D and calcium  added to them (are fortified). ? Ask your health care provider if you would benefit from taking a supplement.  Get out in the sunshine for a short time every day. This increases production of vitamin D.  Get 30 minutes of exercise  at least 5 days a week. Weight-bearing and strength exercises are best for musculoskeletal health. Ask your health care provider what type of exercise is safe for you.  Do not drink alcohol if: ? Your health care provider tells you not to drink. ? You are pregnant, may be pregnant, or are planning to become pregnant.  If you drink alcohol, limit how much you have: ? 0-1 drink a day for women. ? 0-2 drinks a day for men.  Be aware of how much alcohol is in your drink. In the [place] one drink equals one 12 oz bottle of beer (355 mL), one 5 oz glass of wine (148 mL), or one 1 oz glass of hard liquor (44 mL). Where to find more information You may find more information about smoking, musculoskeletal health, and quitting smoking from:  [place] [employer]: orthoinfo.aaos.[place], Osteoporosis and Related [place]: bones.SouthExposed.es  HelpGuide.org: helpguide.org  https://hall.com/: smokefree.gov  American Lung Association: lung.org Contact a health care provider if:  You need help to quit smoking. Summary  When you smoke, the effects on your lungs and heart result in less oxygen for your musculoskeletal system.  Even stopping smoking later in life can improve musculoskeletal health.  Do not use any products that contain nicotine or tobacco, such as cigarettes and e-cigarettes.  If you need help quitting, ask your health care provider. This information is not intended to replace advice given to you by your health care provider. Make sure you discuss any questions you have with your health care provider. Document Revised: 06/14/2019 Document Reviewed: 01/15/2018 Elsevier Patient Education  [place] 65 Years and Older, Female Preventive care refers to lifestyle choices and visits with your health care provider that can promote health and wellness. What does preventive care  include?  A yearly physical exam. This is also called an annual well check.  Dental exams once or twice a year.  Routine eye exams. Ask your health care provider how often you should have your eyes checked.  Personal lifestyle choices, including:  Daily care of your teeth and gums.  Regular physical activity.  Eating a healthy diet.  Avoiding tobacco and drug use.  Limiting alcohol use.  Practicing safe sex.  Taking low-dose aspirin every day.  Taking vitamin and mineral supplements as recommended by your health care provider. What happens during an annual well check? The services and screenings done by your health care provider during your annual well check will depend on your age, overall health, lifestyle risk factors, and family history of disease. Counseling  Your health care provider may ask you questions about your:  Alcohol use.  Tobacco use.  Drug use.  Emotional well-being.  Home and relationship well-being.  Sexual activity.  Eating habits.  History of falls.  Memory and ability to understand (cognition).  Work and work Statistician.  Reproductive health. Screening  You may have the following tests or measurements:  Height, weight, and BMI.  Blood pressure.  Lipid and cholesterol levels. These may be checked every 5 years, or more frequently if you are over 30 years old.  Skin check.  Lung cancer screening. You may have  this screening every year starting at age 85 if you have a 30-pack-year history of smoking and currently smoke or have quit within the past 15 years.  Fecal occult blood test (FOBT) of the stool. You may have this test every year starting at age 28.  Flexible sigmoidoscopy or colonoscopy. You may have a sigmoidoscopy every 5 years or a colonoscopy every 10 years starting at age 71.  Hepatitis C blood test.  Hepatitis B blood test.  Sexually transmitted disease (STD) testing.  Diabetes screening. This is done by  checking your blood sugar (glucose) after you have not eaten for a while (fasting). You may have this done every 1-3 years.  Bone density scan. This is done to screen for osteoporosis. You may have this done starting at age 60.  Mammogram. This may be done every 1-2 years. Talk to your health care provider about how often you should have regular mammograms. Talk with your health care provider about your test results, treatment options, and if necessary, the need for more tests. Vaccines  Your health care provider may recommend certain vaccines, such as:  Influenza vaccine. This is recommended every year.  Tetanus, diphtheria, and acellular pertussis (Tdap, Td) vaccine. You may need a Td booster every 10 years.  Zoster vaccine. You may need this after age 48.  Pneumococcal 13-valent conjugate (PCV13) vaccine. One dose is recommended after age 27.  Pneumococcal polysaccharide (PPSV23) vaccine. One dose is recommended after age 25. Talk to your health care provider about which screenings and vaccines you need and how often you need them. This information is not intended to replace advice given to you by your health care provider. Make sure you discuss any questions you have with your health care provider. Document Released: 10/16/2015 Document Revised: 06/08/2016 Document Reviewed: 07/21/2015 Elsevier Interactive Patient Education  2017 [place] Prevention in the Home Falls can cause injuries. They can happen to people of all ages. There are many things you can do to make your home safe and to help prevent falls. What can I do on the outside of my home?  Regularly fix the edges of walkways and driveways and fix any cracks.  Remove anything that might make you trip as you walk through a door, such as a raised step or threshold.  Trim any bushes or trees on the path to your home.  Use bright outdoor lighting.  Clear any walking paths of anything that might make someone trip,  such as rocks or tools.  Regularly check to see if handrails are loose or broken. Make sure that both sides of any steps have handrails.  Any raised decks and porches should have guardrails on the edges.  Have any leaves, snow, or ice cleared regularly.  Use sand or salt on walking paths during winter.  Clean up any spills in your garage right away. This includes oil or grease spills. What can I do in the bathroom?  Use night lights.  Install grab bars by the toilet and in the tub and shower. Do not use towel bars as grab bars.  Use non-skid mats or decals in the tub or shower.  If you need to sit down in the shower, use a plastic, non-slip stool.  Keep the floor dry. Clean up any water that spills on the floor as soon as it happens.  Remove soap buildup in the tub or shower regularly.  Attach bath mats securely with double-sided non-slip rug tape.  Do not have  throw rugs and other things on the floor that can make you trip. What can I do in the bedroom?  Use night lights.  Make sure that you have a light by your bed that is easy to reach.  Do not use any sheets or blankets that are too big for your bed. They should not hang down onto the floor.  Have a firm chair that has side arms. You can use this for support while you get dressed.  Do not have throw rugs and other things on the floor that can make you trip. What can I do in the kitchen?  Clean up any spills right away.  Avoid walking on wet floors.  Keep items that you use a lot in easy-to-reach places.  If you need to reach something above you, use a strong step stool that has a grab bar.  Keep electrical cords out of the way.  Do not use floor polish or wax that makes floors slippery. If you must use wax, use non-skid floor wax.  Do not have throw rugs and other things on the floor that can make you trip. What can I do with my stairs?  Do not leave any items on the stairs.  Make sure that there are  handrails on both sides of the stairs and use them. Fix handrails that are broken or loose. Make sure that handrails are as long as the stairways.  Check any carpeting to make sure that it is firmly attached to the stairs. Fix any carpet that is loose or worn.  Avoid having throw rugs at the top or bottom of the stairs. If you do have throw rugs, attach them to the floor with carpet tape.  Make sure that you have a light switch at the top of the stairs and the bottom of the stairs. If you do not have them, ask someone to add them for you. What else can I do to help prevent falls?  Wear shoes that:  Do not have high heels.  Have rubber bottoms.  Are comfortable and fit you well.  Are closed at the toe. Do not wear sandals.  If you use a stepladder:  Make sure that it is fully opened. Do not climb a closed stepladder.  Make sure that both sides of the stepladder are locked into place.  Ask someone to hold it for you, if possible.  Clearly mark and make sure that you can see:  Any grab bars or handrails.  First and last steps.  Where the edge of each step is.  Use tools that help you move around (mobility aids) if they are needed. These include:  Canes.  Walkers.  Scooters.  Crutches.  Turn on the lights when you go into a dark area. Replace any light bulbs as soon as they burn out.  Set up your furniture so you have a clear path. Avoid moving your furniture around.  If any of your floors are uneven, fix them.  If there are any pets around you, be aware of where they are.  Review your medicines with your doctor. Some medicines can make you feel dizzy. This can increase your chance of falling. Ask your doctor what other things that you can do to help prevent falls. This information is not intended to replace advice given to you by your health care provider. Make sure you discuss any questions you have with your health care provider. Document Released: 07/16/2009  Document Revised: 02/25/2016 Document Reviewed:  10/24/2014 Elsevier Interactive Patient Education  2017 [employer].

## 2020-02-12 NOTE — Patient Instructions (Signed)
DASH Eating Plan DASH stands for "Dietary Approaches to Stop Hypertension." The DASH eating plan is a healthy eating plan that has been shown to reduce high blood pressure (hypertension). It may also reduce your risk for type 2 diabetes, heart disease, and stroke. The DASH eating plan may also help with weight loss. What are tips for following this plan?  General guidelines  Avoid eating more than 2,300 mg (milligrams) of salt (sodium) a day. If you have hypertension, you may need to reduce your sodium intake to 1,500 mg a day.  Limit alcohol intake to no more than 1 drink a day for nonpregnant women and 2 drinks a day for men. One drink equals 12 oz of beer, 5 oz of wine, or 1 oz of hard liquor.  Work with your health care provider to maintain a healthy body weight or to lose weight. Ask what an ideal weight is for you.  Get at least 30 minutes of exercise that causes your heart to beat faster (aerobic exercise) most days of the week. Activities may include walking, swimming, or biking.  Work with your health care provider or diet and nutrition specialist (dietitian) to adjust your eating plan to your individual calorie needs. Reading food labels   Check food labels for the amount of sodium per serving. Choose foods with less than 5 percent of the Daily Value of sodium. Generally, foods with less than 300 mg of sodium per serving fit into this eating plan.  To find whole grains, look for the word "whole" as the first word in the ingredient list. Shopping  Buy products labeled as "low-sodium" or "no salt added."  Buy fresh foods. Avoid canned foods and premade or frozen meals. Cooking  Avoid adding salt when cooking. Use salt-free seasonings or herbs instead of table salt or sea salt. Check with your health care provider or pharmacist before using salt substitutes.  Do not fry foods. Cook foods using healthy methods such as baking, boiling, grilling, and broiling instead.  Cook with  heart-healthy oils, such as olive, canola, soybean, or sunflower oil. Meal planning  Eat a balanced diet that includes: ? 5 or more servings of fruits and vegetables each day. At each meal, try to fill half of your plate with fruits and vegetables. ? Up to 6-8 servings of whole grains each day. ? Less than 6 oz of lean meat, poultry, or fish each day. A 3-oz serving of meat is about the same size as a deck of cards. One egg equals 1 oz. ? 2 servings of low-fat dairy each day. ? A serving of nuts, seeds, or beans 5 times each week. ? Heart-healthy fats. Healthy fats called Omega-3 fatty acids are found in foods such as flaxseeds and coldwater fish, like sardines, salmon, and mackerel.  Limit how much you eat of the following: ? Canned or prepackaged foods. ? Food that is high in trans fat, such as fried foods. ? Food that is high in saturated fat, such as fatty meat. ? Sweets, desserts, sugary drinks, and other foods with added sugar. ? Full-fat dairy products.  Do not salt foods before eating.  Try to eat at least 2 vegetarian meals each week.  Eat more home-cooked food and less restaurant, buffet, and fast food.  When eating at a restaurant, ask that your food be prepared with less salt or no salt, if possible. What foods are recommended? The items listed may not be a complete list. Talk with your dietitian about   what dietary choices are best for you. Grains Whole-grain or whole-wheat bread. Whole-grain or whole-wheat pasta. Brown rice. Oatmeal. Quinoa. Bulgur. Whole-grain and low-sodium cereals. Pita bread. Low-fat, low-sodium crackers. Whole-wheat flour tortillas. Vegetables Fresh or frozen vegetables (raw, steamed, roasted, or grilled). Low-sodium or reduced-sodium tomato and vegetable juice. Low-sodium or reduced-sodium tomato sauce and tomato paste. Low-sodium or reduced-sodium canned vegetables. Fruits All fresh, dried, or frozen fruit. Canned fruit in natural juice (without  added sugar). Meat and other protein foods Skinless chicken or turkey. Ground chicken or turkey. Pork with fat trimmed off. Fish and seafood. Egg whites. Dried beans, peas, or lentils. Unsalted nuts, nut butters, and seeds. Unsalted canned beans. Lean cuts of beef with fat trimmed off. Low-sodium, lean deli meat. Dairy Low-fat (1%) or fat-free (skim) milk. Fat-free, low-fat, or reduced-fat cheeses. Nonfat, low-sodium ricotta or cottage cheese. Low-fat or nonfat yogurt. Low-fat, low-sodium cheese. Fats and oils Soft margarine without trans fats. Vegetable oil. Low-fat, reduced-fat, or light mayonnaise and salad dressings (reduced-sodium). Canola, safflower, olive, soybean, and sunflower oils. Avocado. Seasoning and other foods Herbs. Spices. Seasoning mixes without salt. Unsalted popcorn and pretzels. Fat-free sweets. What foods are not recommended? The items listed may not be a complete list. Talk with your dietitian about what dietary choices are best for you. Grains Baked goods made with fat, such as croissants, muffins, or some breads. Dry pasta or rice meal packs. Vegetables Creamed or fried vegetables. Vegetables in a cheese sauce. Regular canned vegetables (not low-sodium or reduced-sodium). Regular canned tomato sauce and paste (not low-sodium or reduced-sodium). Regular tomato and vegetable juice (not low-sodium or reduced-sodium). Pickles. Olives. Fruits Canned fruit in a light or heavy syrup. Fried fruit. Fruit in cream or butter sauce. Meat and other protein foods Fatty cuts of meat. Ribs. Fried meat. Bacon. Sausage. Bologna and other processed lunch meats. Salami. Fatback. Hotdogs. Bratwurst. Salted nuts and seeds. Canned beans with added salt. Canned or smoked fish. Whole eggs or egg yolks. Chicken or turkey with skin. Dairy Whole or 2% milk, cream, and half-and-half. Whole or full-fat cream cheese. Whole-fat or sweetened yogurt. Full-fat cheese. Nondairy creamers. Whipped toppings.  Processed cheese and cheese spreads. Fats and oils Butter. Stick margarine. Lard. Shortening. Ghee. Bacon fat. Tropical oils, such as coconut, palm kernel, or palm oil. Seasoning and other foods Salted popcorn and pretzels. Onion salt, garlic salt, seasoned salt, table salt, and sea salt. Worcestershire sauce. Tartar sauce. Barbecue sauce. Teriyaki sauce. Soy sauce, including reduced-sodium. Steak sauce. Canned and packaged gravies. Fish sauce. Oyster sauce. Cocktail sauce. Horseradish that you find on the shelf. Ketchup. Mustard. Meat flavorings and tenderizers. Bouillon cubes. Hot sauce and Tabasco sauce. Premade or packaged marinades. Premade or packaged taco seasonings. Relishes. Regular salad dressings. Where to find more information:  National Heart, Lung, and Blood Institute: www.nhlbi.nih.gov  American Heart Association: www.heart.org Summary  The DASH eating plan is a healthy eating plan that has been shown to reduce high blood pressure (hypertension). It may also reduce your risk for type 2 diabetes, heart disease, and stroke.  With the DASH eating plan, you should limit salt (sodium) intake to 2,300 mg a day. If you have hypertension, you may need to reduce your sodium intake to 1,500 mg a day.  When on the DASH eating plan, aim to eat more fresh fruits and vegetables, whole grains, lean proteins, low-fat dairy, and heart-healthy fats.  Work with your health care provider or diet and nutrition specialist (dietitian) to adjust your eating plan to your   individual calorie needs. This information is not intended to replace advice given to you by your health care provider. Make sure you discuss any questions you have with your health care provider. Document Revised: 09/01/2017 Document Reviewed: 09/12/2016 Elsevier Patient Education  2020 [employer]  

## 2020-02-12 NOTE — Progress Notes (Signed)
This visit occurred during the SARS-CoV-2 public health emergency.  Safety protocols were in place, including screening questions prior to the visit, additional usage of staff PPE, and extensive cleaning of exam room while observing appropriate contact time as indicated for disinfecting solutions.  Subjective:   Maureen Rodgers is a 69 y.o. female who presents for Medicare Annual (Subsequent) preventive examination.  Review of Systems:  n/a Cardiac Risk Factors include: sedentary lifestyle;smoking/ tobacco exposure     Objective:     Vitals: BP 138/90 (BP Location: Left Arm, Patient Position: Sitting, Cuff Size: Normal)   Pulse 96   Temp 98.3 F (36.8 C) (Oral)   Ht 5\' 2"  (1.575 m)   Wt 116 lb 3.2 oz (52.7 kg)   SpO2 99%   BMI 21.25 kg/m   Body mass index is 21.25 kg/m.  Advanced Directives 02/12/2020 12/20/2018 10/23/2015  Does Patient Have a Medical Advance Directive? No No No  Would patient like information on creating a medical advance directive? Yes (MAU/Ambulatory/Procedural Areas - Information given) Yes (MAU/Ambulatory/Procedural Areas - Information given) No - patient declined information    Tobacco Social History   Tobacco Use  Smoking Status Current Every Day Smoker  . Packs/day: 0.50  . Years: 44.00  . Pack years: 22.00  . Types: Cigarettes  . Start date: 1976  Smokeless Tobacco Never Used  Tobacco Comment   10 cigs/day, will decrease number of cigs smoked/day     Ready to quit: Yes Counseling given: Yes Comment: 10 cigs/day, will decrease number of cigs smoked/day   Clinical Intake:  Pre-visit preparation completed: Yes  Pain : 0-10 Pain Score: 5  Pain Type: Chronic pain Pain Location: Back Pain Orientation: Lower Pain Radiating Towards: down hips Pain Descriptors / Indicators: Aching Pain Onset: More than a month ago Pain Frequency: Constant Pain Relieving Factors: cream eases it some  Pain Relieving Factors: cream eases it  some  Nutritional Status: BMI of 19-24  Normal Nutritional Risks: None Diabetes: No  How often do you need to have someone help you when you read instructions, pamphlets, or other written materials from your doctor or pharmacy?: 1 - Never What is the last grade level you completed in school?: college  Interpreter Needed?: No  Information entered by :: NAllen LPN  Past Medical History:  Diagnosis Date  . GERD (gastroesophageal reflux disease)   . Tobacco use    Past Surgical History:  Procedure Laterality Date  . ABDOMINAL HYSTERECTOMY    . EYE SURGERY     Family History  Problem Relation Age of Onset  . Heart disease Mother   . Dementia Mother   . Alcohol abuse Mother   . Alcohol abuse Father   . Diabetes Brother    Social History   Socioeconomic History  . Marital status: Single    Spouse name: Not on file  . Number of children: Not on file  . Years of education: Not on file  . Highest education level: Not on file  Occupational History  . Not on file  Tobacco Use  . Smoking status: Current Every Day Smoker    Packs/day: 0.50    Years: 44.00    Pack years: 22.00    Types: Cigarettes    Start date: 49  . Smokeless tobacco: Never Used  . Tobacco comment: 10 cigs/day, will decrease number of cigs smoked/day  Substance and Sexual Activity  . Alcohol use: Yes    Alcohol/week: 3.0 standard drinks    Types:  3 Cans of beer per week    Comment: 3 beers a day  . Drug use: No  . Sexual activity: Not Currently  Other Topics Concern  . Not on file  Social History Narrative  . Not on file   Social Determinants of Health   Financial Resource Strain: Low Risk   . Difficulty of Paying Living Expenses: Not hard at all  Food Insecurity: No Food Insecurity  . Worried About Charity fundraiser in the Last Year: Never true  . Ran Out of Food in the Last Year: Never true  Transportation Needs: No Transportation Needs  . Lack of Transportation (Medical): No  . Lack of  Transportation (Non-Medical): No  Physical Activity: Inactive  . Days of Exercise per Week: 0 days  . Minutes of Exercise per Session: 0 min  Stress: No Stress Concern Present  . Feeling of Stress : Not at all  Social Connections:   . Frequency of Communication with Friends and Family:   . Frequency of Social Gatherings with Friends and Family:   . Attends Religious Services:   . Active Member of Clubs or Organizations:   . Attends Archivist Meetings:   Marland Kitchen Marital Status:     Outpatient Encounter Medications as of 02/12/2020  Medication Sig  . alendronate (FOSAMAX) 70 MG tablet TAKE 1 TABLET BY MOUTH ONE TIME PER WEEK  . ALPRAZolam (XANAX) 0.5 MG tablet Take 0.5 mg by mouth at bedtime as needed for sleep (anxiety).  . DULoxetine (CYMBALTA) 30 MG capsule Take 1 capsule (30 mg total) by mouth daily.  Marland Kitchen esomeprazole (NEXIUM) 40 MG capsule Take 40 mg by mouth daily before breakfast.  . fluticasone (FLONASE) 50 MCG/ACT nasal spray Place 1 spray into both nostrils daily.  . Omega-3 Fatty Acids (FISH OIL) 1000 MG CAPS Take 1,000 mg by mouth daily.  Marland Kitchen PROAIR HFA 108 (90 Base) MCG/ACT inhaler INHALE 2 PUFFS EVERY 4-6 HOURS  . traZODone (DESYREL) 50 MG tablet Take 50 mg by mouth at bedtime.  . Vitamin D, Ergocalciferol, (DRISDOL) 50000 UNITS CAPS capsule Take 50,000 Units by mouth See admin instructions. Pt takes twice weekly. Tuesday and Friday  . fluticasone (FLONASE) 50 MCG/ACT nasal spray Place 2 sprays into both nostrils daily for 30 days. (Patient not taking: Reported on 12/24/2018)  . HYDROcodone-homatropine (HYDROMET) 5-1.5 MG/5ML syrup Take 5 mLs by mouth every 6 (six) hours as needed. (Patient not taking: Reported on 06/26/2019)  . pneumococcal 13-valent conjugate vaccine (PREVNAR 13) SUSP injection Inject 0.5 mLs into the muscle tomorrow at 10 am for 1 dose.  . Tdap (BOOSTRIX) 5-2.5-18.5 LF-MCG/0.5 injection Inject 0.5 mLs into the muscle once for 1 dose.   No  facility-administered encounter medications on file as of 02/12/2020.    Activities of Daily Living In your present state of health, do you have any difficulty performing the following activities: 02/12/2020  Hearing? Y  Comment trouble deciphering words sometimes  Vision? N  Difficulty concentrating or making decisions? N  Walking or climbing stairs? N  Dressing or bathing? N  Doing errands, shopping? N  Preparing Food and eating ? N  Using the Toilet? N  In the past six months, have you accidently leaked urine? N  Do you have problems with loss of bowel control? N  Managing your Medications? N  Managing your Finances? N  Housekeeping or managing your Housekeeping? N  Some recent data might be hidden    Patient Care Team: Glendale Chard, MD  as PCP - General (Internal Medicine)    Assessment:   This is a routine wellness examination for [place].  Exercise Activities and Dietary recommendations Current Exercise Habits: The patient does not participate in regular exercise at present  Goals    . Patient Stated (pt-stated)     Wants to stop drinking and to gain weight    . Patient Stated     02/12/2020, would like depression and anxiety to get better, quit smoking       Fall Risk Fall Risk  02/12/2020 08/14/2019 06/26/2019 12/24/2018 12/20/2018  Falls in the past year? 1 1 1  0 1  Comment fell coming out of attic, missed step; tripped over a root in the yard - - - -  Number falls in past yr: 1 1 1  - 0  Comment - - - - tripped over root  Injury with Fall? 0 0 0 - 1  Comment - - - - had surgery on right hand  Risk for fall due to : Medication side effect - - - History of fall(s);Medication side effect  Follow up Falls evaluation completed;Education provided;Falls prevention discussed - - - Falls prevention discussed;Education provided   Is the patient's home free of loose throw rugs in walkways, pet beds, electrical cords, etc?   yes      Grab bars in the bathroom? yes       Handrails on the stairs?   yes      Adequate lighting?   yes  Timed Get Up and Go performed: n/a  Depression Screen PHQ 2/9 Scores 02/12/2020 08/14/2019 06/30/2019 12/24/2018  PHQ - 2 Score 4 0 2 3  PHQ- 9 Score 11 - 3 12     Cognitive Function     6CIT Screen 02/12/2020 12/20/2018  What Year? 0 points 0 points  What month? 0 points 0 points  What time? 0 points 0 points  Count back from 20 0 points 0 points  Months in reverse 0 points 0 points  Repeat phrase 4 points 2 points  Total Score 4 2    Immunization History  Administered Date(s) Administered  . Influenza, High Dose Seasonal PF 06/26/2019  . Moderna SARS-COVID-2 Vaccination 10/29/2019, 11/26/2019    Qualifies for Shingles Vaccine? yes  Screening Tests Health Maintenance  Topic Date Due  . TETANUS/TDAP  Never done  . PNA vac Low Risk Adult (1 of 2 - PCV13) Never done  . INFLUENZA VACCINE  05/03/2020  . MAMMOGRAM  12/23/2021  . COLONOSCOPY  03/31/2029  . DEXA SCAN  Completed  . COVID-19 Vaccine  Completed  . Hepatitis C Screening  Completed    Cancer Screenings: Lung: Low Dose CT Chest recommended if Age 25-80 years, 30 pack-year currently smoking OR have quit w/in 15years. Patient does not qualify. Breast:  Up to date on Mammogram? Yes   Up to date of Bone Density/Dexa? Yes Colorectal: up to date  Additional Screenings: : Hepatitis C Screening: 06/26/2019     Plan:    Patient would like depression and anxiety to get better. She would also like to quit smoking.   I have personally reviewed and noted the following in the patient's chart:   . Medical and social history . Use of alcohol, tobacco or illicit drugs  . Current medications and supplements . Functional ability and status . Nutritional status . Physical activity . Advanced directives . List of other physicians . Hospitalizations, surgeries, and ER visits in previous 12 months . Vitals .  Screenings to include cognitive, depression, and  falls . Referrals and appointments  In addition, I have reviewed and discussed with patient certain preventive protocols, quality metrics, and best practice recommendations. A written personalized care plan for preventive services as well as general preventive health recommendations were provided to patient.     Kellie Simmering, LPN  [id number]

## 2020-02-13 ENCOUNTER — Telehealth: Payer: Self-pay

## 2020-02-13 LAB — CMP14+EGFR
ALT: 10 IU/L (ref 0–32)
AST: 21 IU/L (ref 0–40)
Albumin/Globulin Ratio: 1.9 (ref 1.2–2.2)
Albumin: 4.5 g/dL (ref 3.8–4.8)
Alkaline Phosphatase: 62 IU/L (ref 39–117)
BUN/Creatinine Ratio: 8 — ABNORMAL LOW (ref 12–28)
BUN: 7 mg/dL — ABNORMAL LOW (ref 8–27)
Bilirubin Total: 0.3 mg/dL (ref 0.0–1.2)
CO2: 24 mmol/L (ref 20–29)
Calcium: 9.8 mg/dL (ref 8.7–10.3)
Chloride: 99 mmol/L (ref 96–106)
Creatinine, Ser: 0.89 mg/dL (ref 0.57–1.00)
GFR calc Af Amer: 77 mL/min/{1.73_m2} (ref >59)
GFR calc non Af Amer: 67 mL/min/{1.73_m2} (ref >59)
Globulin, Total: 2.4 g/dL (ref 1.5–4.5)
Glucose: 151 mg/dL — ABNORMAL HIGH (ref 65–99)
Potassium: 4.2 mmol/L (ref 3.5–5.2)
Sodium: 136 mmol/L (ref 134–144)
Total Protein: 6.9 g/dL (ref 6.0–8.5)

## 2020-02-13 LAB — TSH: TSH: 3.37 u[IU]/mL (ref 0.450–4.500)

## 2020-02-13 NOTE — Telephone Encounter (Signed)
Called pt to inform her that we received a letter from [facility] stating that she is nonadherent with her alendronate. Pt stated that she takes medication once a week as prescribed with no issues with getting from pharmacy.

## 2020-02-25 ENCOUNTER — Ambulatory Visit (HOSPITAL_COMMUNITY)
Admission: RE | Admit: 2020-02-25 | Discharge: 2020-02-25 | Disposition: A | Discharge status: Home / Self Care | Payer: Medicare Other | Source: Ambulatory Visit | Attending: Gastroenterology | Admitting: Gastroenterology

## 2020-02-25 ENCOUNTER — Other Ambulatory Visit: Payer: Self-pay | Admitting: Gastroenterology

## 2020-02-25 ENCOUNTER — Other Ambulatory Visit (HOSPITAL_COMMUNITY): Payer: Self-pay | Admitting: Gastroenterology

## 2020-02-25 ENCOUNTER — Other Ambulatory Visit: Payer: Self-pay

## 2020-02-25 DIAGNOSIS — K610 Anal abscess: Secondary | ICD-10-CM

## 2020-02-25 DIAGNOSIS — K6289 Other specified diseases of anus and rectum: Secondary | ICD-10-CM | POA: Diagnosis not present

## 2020-02-25 MED ORDER — GADOBUTROL 1 MMOL/ML IV SOLN
6.0000 mL | Freq: Once | INTRAVENOUS | Status: AC | PRN
  Administered 2020-02-25: 6 mL via INTRAVENOUS

## 2020-02-26 ENCOUNTER — Encounter (HOSPITAL_COMMUNITY): Payer: Self-pay | Admitting: Emergency Medicine

## 2020-02-26 ENCOUNTER — Observation Stay (HOSPITAL_COMMUNITY)
Admission: EM | Admit: 2020-02-26 | Discharge: 2020-02-28 | Disposition: A | Discharge status: Home / Self Care | Payer: Medicare Other | Attending: Physician Assistant | Admitting: Physician Assistant

## 2020-02-26 ENCOUNTER — Telehealth: Payer: Self-pay

## 2020-02-26 ENCOUNTER — Other Ambulatory Visit: Payer: Self-pay

## 2020-02-26 DIAGNOSIS — K610 Anal abscess: Secondary | ICD-10-CM

## 2020-02-26 DIAGNOSIS — K219 Gastro-esophageal reflux disease without esophagitis: Secondary | ICD-10-CM | POA: Insufficient documentation

## 2020-02-26 DIAGNOSIS — Z79899 Other long term (current) drug therapy: Secondary | ICD-10-CM | POA: Diagnosis not present

## 2020-02-26 DIAGNOSIS — Z20822 Contact with and (suspected) exposure to covid-19: Secondary | ICD-10-CM | POA: Diagnosis not present

## 2020-02-26 DIAGNOSIS — F17210 Nicotine dependence, cigarettes, uncomplicated: Secondary | ICD-10-CM | POA: Diagnosis not present

## 2020-02-26 DIAGNOSIS — K611 Rectal abscess: Principal | ICD-10-CM | POA: Insufficient documentation

## 2020-02-26 DIAGNOSIS — Z03818 Encounter for observation for suspected exposure to other biological agents ruled out: Secondary | ICD-10-CM | POA: Diagnosis not present

## 2020-02-26 DIAGNOSIS — Z7983 Long term (current) use of bisphosphonates: Secondary | ICD-10-CM | POA: Diagnosis not present

## 2020-02-26 HISTORY — DX: Depression, unspecified: F32.A

## 2020-02-26 LAB — CBC
HCT: 32.3 % — ABNORMAL LOW (ref 36.0–46.0)
Hemoglobin: 10.2 g/dL — ABNORMAL LOW (ref 12.0–15.0)
MCH: 25.4 pg — ABNORMAL LOW (ref 26.0–34.0)
MCHC: 31.6 g/dL (ref 30.0–36.0)
MCV: 80.5 fL (ref 80.0–100.0)
Platelets: 364 10*3/uL (ref 150–400)
RBC: 4.01 MIL/uL (ref 3.87–5.11)
RDW: 18.6 % — ABNORMAL HIGH (ref 11.5–15.5)
WBC: 12.4 10*3/uL — ABNORMAL HIGH (ref 4.0–10.5)
nRBC: 0 % (ref 0.0–0.2)

## 2020-02-26 LAB — BASIC METABOLIC PANEL
Anion gap: 9 (ref 5–15)
BUN: <5 mg/dL — ABNORMAL LOW (ref 8–23)
CO2: 26 mmol/L (ref 22–32)
Calcium: 9.1 mg/dL (ref 8.9–10.3)
Chloride: 97 mmol/L — ABNORMAL LOW (ref 98–111)
Creatinine, Ser: 0.69 mg/dL (ref 0.44–1.00)
GFR calc Af Amer: >60 mL/min (ref >60)
GFR calc non Af Amer: >60 mL/min (ref >60)
Glucose, Bld: 101 mg/dL — ABNORMAL HIGH (ref 70–99)
Potassium: 3.8 mmol/L (ref 3.5–5.1)
Sodium: 132 mmol/L — ABNORMAL LOW (ref 135–145)

## 2020-02-26 NOTE — Telephone Encounter (Signed)
I called the pt because Dr. Baird Cancer wants to know if the pt received an antibiotic from Dr. Collene Mares for perianal abscess.

## 2020-02-26 NOTE — ED Triage Notes (Signed)
Patient arrives to ED with complaints of a perianal abscess for the past week. Patient went to her MD who told her should needed surgery. Patient states the pain continues to get worse.

## 2020-02-27 ENCOUNTER — Observation Stay (HOSPITAL_COMMUNITY): Payer: Medicare Other | Admitting: Certified Registered Nurse Anesthetist

## 2020-02-27 ENCOUNTER — Encounter (HOSPITAL_COMMUNITY)
Admission: EM | Disposition: A | Discharge status: Home / Self Care | Payer: Self-pay | Source: Home / Self Care | Attending: Emergency Medicine

## 2020-02-27 ENCOUNTER — Encounter (HOSPITAL_COMMUNITY): Payer: Self-pay | Admitting: Student

## 2020-02-27 DIAGNOSIS — Z20822 Contact with and (suspected) exposure to covid-19: Secondary | ICD-10-CM | POA: Diagnosis not present

## 2020-02-27 DIAGNOSIS — Z7983 Long term (current) use of bisphosphonates: Secondary | ICD-10-CM | POA: Diagnosis not present

## 2020-02-27 DIAGNOSIS — K611 Rectal abscess: Secondary | ICD-10-CM | POA: Diagnosis present

## 2020-02-27 DIAGNOSIS — Z79899 Other long term (current) drug therapy: Secondary | ICD-10-CM | POA: Diagnosis not present

## 2020-02-27 DIAGNOSIS — K219 Gastro-esophageal reflux disease without esophagitis: Secondary | ICD-10-CM | POA: Diagnosis not present

## 2020-02-27 DIAGNOSIS — F17210 Nicotine dependence, cigarettes, uncomplicated: Secondary | ICD-10-CM | POA: Diagnosis not present

## 2020-02-27 DIAGNOSIS — E782 Mixed hyperlipidemia: Secondary | ICD-10-CM | POA: Diagnosis not present

## 2020-02-27 DIAGNOSIS — F329 Major depressive disorder, single episode, unspecified: Secondary | ICD-10-CM | POA: Diagnosis not present

## 2020-02-27 HISTORY — PX: IRRIGATION AND DEBRIDEMENT ABSCESS: SHX5252

## 2020-02-27 HISTORY — PX: INCISION AND DRAINAGE PERIRECTAL ABSCESS: SHX1804

## 2020-02-27 LAB — SARS CORONAVIRUS 2 BY RT PCR (HOSPITAL ORDER, PERFORMED IN ~~LOC~~ HOSPITAL LAB): SARS Coronavirus 2: NEGATIVE

## 2020-02-27 SURGERY — INCISION AND DRAINAGE, ABSCESS, PERIRECTAL
Anesthesia: General | Site: Perineum

## 2020-02-27 MED ORDER — OXYCODONE HCL 5 MG PO TABS: 5.0000 mg | ORAL_TABLET | Freq: Four times a day (QID) | ORAL | Status: DC | PRN

## 2020-02-27 MED ORDER — GABAPENTIN 300 MG PO CAPS
ORAL_CAPSULE | ORAL | Status: AC
  Filled 2020-02-27: qty 1

## 2020-02-27 MED ORDER — ACETAMINOPHEN 325 MG PO TABS: 650.0000 mg | ORAL_TABLET | Freq: Four times a day (QID) | ORAL | Status: DC | PRN

## 2020-02-27 MED ORDER — KCL IN DEXTROSE-NACL 20-5-0.45 MEQ/L-%-% IV SOLN
INTRAVENOUS | Status: DC
  Filled 2020-02-27: qty 14000

## 2020-02-27 MED ORDER — CHLORHEXIDINE GLUCONATE CLOTH 2 % EX PADS: 6.0000 | MEDICATED_PAD | Freq: Once | CUTANEOUS | Status: DC

## 2020-02-27 MED ORDER — BUPIVACAINE HCL (PF) 0.25 % IJ SOLN
INTRAMUSCULAR | Status: AC
  Filled 2020-02-27: qty 30

## 2020-02-27 MED ORDER — ONDANSETRON HCL 4 MG/2ML IJ SOLN
INTRAMUSCULAR | Status: AC
  Filled 2020-02-27: qty 2

## 2020-02-27 MED ORDER — LIDOCAINE 2% (20 MG/ML) 5 ML SYRINGE
INTRAMUSCULAR | Status: AC
  Filled 2020-02-27: qty 5

## 2020-02-27 MED ORDER — ACETAMINOPHEN 650 MG RE SUPP: 650.0000 mg | Freq: Four times a day (QID) | RECTAL | Status: DC | PRN

## 2020-02-27 MED ORDER — DEXAMETHASONE SODIUM PHOSPHATE 10 MG/ML IJ SOLN
INTRAMUSCULAR | Status: DC | PRN
  Administered 2020-02-27: 5 mg via INTRAVENOUS

## 2020-02-27 MED ORDER — METOPROLOL TARTRATE 5 MG/5ML IV SOLN: 5.0000 mg | Freq: Four times a day (QID) | INTRAVENOUS | Status: DC | PRN

## 2020-02-27 MED ORDER — CHLORHEXIDINE GLUCONATE 0.12 % MT SOLN
OROMUCOSAL | Status: AC
  Administered 2020-02-27: 15 mL via OROMUCOSAL
  Filled 2020-02-27: qty 15

## 2020-02-27 MED ORDER — POLYETHYLENE GLYCOL 3350 17 G PO PACK: 17.0000 g | PACK | Freq: Every day | ORAL | Status: DC | PRN

## 2020-02-27 MED ORDER — GLYCOPYRROLATE 0.2 MG/ML IJ SOLN
INTRAMUSCULAR | Status: DC | PRN
Start: 2020-02-27 — End: 2020-02-27
  Administered 2020-02-27: .2 mg via INTRAVENOUS

## 2020-02-27 MED ORDER — LACTATED RINGERS IV SOLN: INTRAVENOUS | Status: DC

## 2020-02-27 MED ORDER — GLYCOPYRROLATE PF 0.2 MG/ML IJ SOSY
PREFILLED_SYRINGE | INTRAMUSCULAR | Status: AC
  Filled 2020-02-27: qty 1

## 2020-02-27 MED ORDER — FENTANYL CITRATE (PF) 100 MCG/2ML IJ SOLN
50.0000 ug | Freq: Once | INTRAMUSCULAR | Status: AC
  Administered 2020-02-27: 50 ug via INTRAVENOUS
  Filled 2020-02-27: qty 2

## 2020-02-27 MED ORDER — ONDANSETRON 4 MG PO TBDP: 4.0000 mg | ORAL_TABLET | Freq: Four times a day (QID) | ORAL | Status: DC | PRN

## 2020-02-27 MED ORDER — MIDAZOLAM HCL 2 MG/2ML IJ SOLN
INTRAMUSCULAR | Status: DC | PRN
  Administered 2020-02-27: 2 mg via INTRAVENOUS

## 2020-02-27 MED ORDER — DOCUSATE SODIUM 100 MG PO CAPS
100.0000 mg | ORAL_CAPSULE | Freq: Two times a day (BID) | ORAL | Status: DC
  Administered 2020-02-27 (×2): 100 mg via ORAL
  Filled 2020-02-27 (×2): qty 1

## 2020-02-27 MED ORDER — CELECOXIB 200 MG PO CAPS
200.0000 mg | ORAL_CAPSULE | Freq: Once | ORAL | Status: AC
  Administered 2020-02-27: 200 mg via ORAL
  Filled 2020-02-27: qty 1

## 2020-02-27 MED ORDER — CHLORHEXIDINE GLUCONATE 0.12 % MT SOLN: 15.0000 mL | Freq: Once | OROMUCOSAL | Status: AC

## 2020-02-27 MED ORDER — PROPOFOL 10 MG/ML IV BOLUS
INTRAVENOUS | Status: AC
  Filled 2020-02-27: qty 20

## 2020-02-27 MED ORDER — PIPERACILLIN-TAZOBACTAM 3.375 G IVPB
3.3750 g | Freq: Three times a day (TID) | INTRAVENOUS | Status: DC
  Administered 2020-02-27 – 2020-02-28 (×3): 3.375 g via INTRAVENOUS
  Filled 2020-02-27 (×3): qty 50

## 2020-02-27 MED ORDER — 0.9 % SODIUM CHLORIDE (POUR BTL) OPTIME
TOPICAL | Status: DC | PRN
  Administered 2020-02-27: 1000 mL

## 2020-02-27 MED ORDER — IBUPROFEN 400 MG PO TABS
400.0000 mg | ORAL_TABLET | Freq: Four times a day (QID) | ORAL | Status: DC | PRN
  Administered 2020-02-28: 400 mg via ORAL
  Filled 2020-02-27: qty 1

## 2020-02-27 MED ORDER — FENTANYL CITRATE (PF) 250 MCG/5ML IJ SOLN
INTRAMUSCULAR | Status: DC | PRN
  Administered 2020-02-27 (×2): 25 ug via INTRAVENOUS
  Administered 2020-02-27: 50 ug via INTRAVENOUS

## 2020-02-27 MED ORDER — FENTANYL CITRATE (PF) 250 MCG/5ML IJ SOLN
INTRAMUSCULAR | Status: AC
  Filled 2020-02-27: qty 5

## 2020-02-27 MED ORDER — BUPIVACAINE HCL (PF) 0.25 % IJ SOLN
INTRAMUSCULAR | Status: DC | PRN
  Administered 2020-02-27: 10 mL

## 2020-02-27 MED ORDER — BUPIVACAINE LIPOSOME 1.3 % IJ SUSP
20.0000 mL | Freq: Once | INTRAMUSCULAR | Status: DC
  Filled 2020-02-27: qty 20

## 2020-02-27 MED ORDER — LIDOCAINE 2% (20 MG/ML) 5 ML SYRINGE
INTRAMUSCULAR | Status: DC | PRN
  Administered 2020-02-27: 60 mg via INTRAVENOUS

## 2020-02-27 MED ORDER — MIDAZOLAM HCL 2 MG/2ML IJ SOLN
INTRAMUSCULAR | Status: AC
  Filled 2020-02-27: qty 2

## 2020-02-27 MED ORDER — PROMETHAZINE HCL 25 MG/ML IJ SOLN: 6.2500 mg | INTRAMUSCULAR | Status: DC | PRN

## 2020-02-27 MED ORDER — PROPOFOL 10 MG/ML IV BOLUS
INTRAVENOUS | Status: DC | PRN
  Administered 2020-02-27: 20 mg via INTRAVENOUS
  Administered 2020-02-27: 100 mg via INTRAVENOUS

## 2020-02-27 MED ORDER — ONDANSETRON HCL 4 MG/2ML IJ SOLN: 4.0000 mg | Freq: Four times a day (QID) | INTRAMUSCULAR | Status: DC | PRN

## 2020-02-27 MED ORDER — KCL IN DEXTROSE-NACL 20-5-0.45 MEQ/L-%-% IV SOLN
INTRAVENOUS | Status: DC
  Filled 2020-02-27: qty 1000

## 2020-02-27 MED ORDER — KETOROLAC TROMETHAMINE 15 MG/ML IJ SOLN
15.0000 mg | INTRAMUSCULAR | Status: AC
  Administered 2020-02-27: 15 mg via INTRAVENOUS
  Filled 2020-02-27: qty 1

## 2020-02-27 MED ORDER — ACETAMINOPHEN 500 MG PO TABS
1000.0000 mg | ORAL_TABLET | Freq: Once | ORAL | Status: AC
  Administered 2020-02-27: 1000 mg via ORAL
  Filled 2020-02-27: qty 2

## 2020-02-27 MED ORDER — ACETAMINOPHEN 500 MG PO TABS
1000.0000 mg | ORAL_TABLET | Freq: Three times a day (TID) | ORAL | Status: DC
  Administered 2020-02-27 – 2020-02-28 (×2): 1000 mg via ORAL
  Filled 2020-02-27 (×3): qty 2

## 2020-02-27 MED ORDER — MORPHINE SULFATE (PF) 2 MG/ML IV SOLN: 2.0000 mg | INTRAVENOUS | Status: DC | PRN

## 2020-02-27 MED ORDER — DEXAMETHASONE SODIUM PHOSPHATE 10 MG/ML IJ SOLN
INTRAMUSCULAR | Status: AC
  Filled 2020-02-27: qty 1

## 2020-02-27 MED ORDER — ONDANSETRON HCL 4 MG/2ML IJ SOLN
INTRAMUSCULAR | Status: DC | PRN
  Administered 2020-02-27: 4 mg via INTRAVENOUS

## 2020-02-27 MED ORDER — FENTANYL CITRATE (PF) 100 MCG/2ML IJ SOLN: 25.0000 ug | INTRAMUSCULAR | Status: DC | PRN

## 2020-02-27 MED ORDER — PIPERACILLIN-TAZOBACTAM 3.375 G IVPB 30 MIN
3.3750 g | Freq: Once | INTRAVENOUS | Status: AC
  Administered 2020-02-27: 3.375 g via INTRAVENOUS
  Filled 2020-02-27: qty 50

## 2020-02-27 SURGICAL SUPPLY — 31 items
BNDG GAUZE ELAST 4 BULKY (GAUZE/BANDAGES/DRESSINGS) IMPLANT
COVER MAYO STAND STRL (DRAPES) ×1 IMPLANT
COVER SURGICAL LIGHT HANDLE (MISCELLANEOUS) ×3 IMPLANT
COVER WAND RF STERILE (DRAPES) ×1 IMPLANT
DRAIN PENROSE 1/2X12 LTX STRL (WOUND CARE) ×2 IMPLANT
ELECT CAUTERY BLADE 6.4 (BLADE) ×1 IMPLANT
ELECT REM PT RETURN 9FT ADLT (ELECTROSURGICAL) ×3
ELECTRODE REM PT RTRN 9FT ADLT (ELECTROSURGICAL) ×1 IMPLANT
GAUZE SPONGE 4X4 12PLY STRL (GAUZE/BANDAGES/DRESSINGS) ×2 IMPLANT
GLOVE BIO SURGEON STRL SZ7 (GLOVE) ×3 IMPLANT
GLOVE BIOGEL PI IND STRL 7.5 (GLOVE) ×1 IMPLANT
GLOVE BIOGEL PI INDICATOR 7.5 (GLOVE) ×2
GOWN STRL REUS W/ TWL LRG LVL3 (GOWN DISPOSABLE) ×2 IMPLANT
GOWN STRL REUS W/TWL LRG LVL3 (GOWN DISPOSABLE) ×6
KIT BASIN OR (CUSTOM PROCEDURE TRAY) ×3 IMPLANT
KIT TURNOVER KIT B (KITS) ×3 IMPLANT
NS IRRIG 1000ML POUR BTL (IV SOLUTION) ×3 IMPLANT
PACK GENERAL/GYN (CUSTOM PROCEDURE TRAY) ×2 IMPLANT
PACK LITHOTOMY IV (CUSTOM PROCEDURE TRAY) IMPLANT
PAD ABD 8X10 STRL (GAUZE/BANDAGES/DRESSINGS) ×2 IMPLANT
PAD ARMBOARD 7.5X6 YLW CONV (MISCELLANEOUS) ×3 IMPLANT
PENCIL SMOKE EVACUATOR (MISCELLANEOUS) ×3 IMPLANT
SPONGE LAP 18X18 RF (DISPOSABLE) IMPLANT
SURGILUBE 2OZ TUBE FLIPTOP (MISCELLANEOUS) ×3 IMPLANT
SUT ETHILON 2 0 FS 18 (SUTURE) ×2 IMPLANT
SYR BULB EAR ULCER 3OZ GRN STR (SYRINGE) ×1 IMPLANT
TOWEL GREEN STERILE (TOWEL DISPOSABLE) ×3 IMPLANT
TOWEL GREEN STERILE FF (TOWEL DISPOSABLE) ×3 IMPLANT
TUBE CONNECTING 12'X1/4 (SUCTIONS)
TUBE CONNECTING 12X1/4 (SUCTIONS) ×1 IMPLANT
YANKAUER SUCT BULB TIP NO VENT (SUCTIONS) ×1 IMPLANT

## 2020-02-27 NOTE — Anesthesia Preprocedure Evaluation (Addendum)
Anesthesia Evaluation  Patient identified by MRN, date of birth, ID band Patient awake    Reviewed: Allergy & Precautions, NPO status , Patient's Chart, lab work & pertinent test results  History of Anesthesia Complications Negative for: history of anesthetic complications  Airway Mallampati: II  TM Distance: >3 FB Neck ROM: Full    Dental no notable dental hx. (+) Dental Advisory Given   Pulmonary Current Smoker,    Pulmonary exam normal        Cardiovascular negative cardio ROS Normal cardiovascular exam     Neuro/Psych negative neurological ROS     GI/Hepatic Neg liver ROS, GERD  ,  Endo/Other  negative endocrine ROS  Renal/GU negative Renal ROS     Musculoskeletal negative musculoskeletal ROS (+)   Abdominal   Peds  Hematology negative hematology ROS (+)   Anesthesia Other Findings   Reproductive/Obstetrics                            Anesthesia Physical Anesthesia Plan  ASA: II and emergent  Anesthesia Plan: General   Post-op Pain Management:    Induction: Intravenous  PONV Risk Score and Plan: 3 and Ondansetron, Dexamethasone and Midazolam  Airway Management Planned: LMA  Additional Equipment:   Intra-op Plan:   Post-operative Plan: Extubation in OR  Informed Consent: I have reviewed the patients History and Physical, chart, labs and discussed the procedure including the risks, benefits and alternatives for the proposed anesthesia with the patient or authorized representative who has indicated his/her understanding and acceptance.     Dental advisory given  Plan Discussed with: Anesthesiologist, CRNA and Surgeon  Anesthesia Plan Comments:        Anesthesia Quick Evaluation

## 2020-02-27 NOTE — H&P (Signed)
Maureen Rodgers is an 69 y.o. female.   Chief Complaint: Rectal pain HPI: 69 year old female presents with a 1 week history of worsening perirectal pain with swelling.  She had a BM yesterday.  She saw Dr. Juanita Craver as an outpatient.  A MRI was obtained on 5/25.  This showed a large horseshoe-shaped abscess with possible fistula.  She presented to the ED yesterday for evaluation.  We were called this morning to drain the abscess.  Past Medical History:  Diagnosis Date  . GERD (gastroesophageal reflux disease)   . Tobacco use     Past Surgical History:  Procedure Laterality Date  . ABDOMINAL HYSTERECTOMY    . BILATERAL CARPAL TUNNEL RELEASE  2018   2018 on Right, many years ago on left  . EYE SURGERY      Family History  Problem Relation Age of Onset  . Heart disease Mother   . Dementia Mother   . Alcohol abuse Mother   . Alcohol abuse Father   . Diabetes Brother    Social History:  reports that she has been smoking cigarettes. She started smoking about 45 years ago. She has a 22.00 pack-year smoking history. She has never used smokeless tobacco. She reports current alcohol use of about 3.0 standard drinks of alcohol per week. She reports that she does not use drugs.  Allergies: No Known Allergies  Prior to Admission medications   Medication Sig Start Date End Date Taking? Authorizing Provider  alendronate (FOSAMAX) 70 MG tablet TAKE 1 TABLET BY MOUTH ONE TIME PER WEEK Patient taking differently: Take 70 mg by mouth every Sunday.  11/04/19  Yes Glendale Chard, MD  ALPRAZolam Duanne Moron) 0.5 MG tablet Take 0.5 mg by mouth 3 (three) times daily as needed for anxiety or sleep.    Yes [provider]  DULoxetine (CYMBALTA) 30 MG capsule Take 1 capsule (30 mg total) by mouth daily. 08/14/19 08/13/20 Yes Glendale Chard, MD  esomeprazole (NEXIUM) 40 MG capsule Take 40 mg by mouth daily before breakfast.   Yes [provider]  fluticasone (FLONASE) 50 MCG/ACT nasal spray  Place 1 spray into both nostrils daily. 09/07/18  Yes Minette Brine, FNP  Omega-3 Fatty Acids (FISH OIL) 1000 MG CAPS Take 1,000 mg by mouth daily.   Yes [provider]  PROAIR HFA 108 (90 Base) MCG/ACT inhaler Inhale 2 puffs into the lungs every 4 (four) hours as needed for wheezing or shortness of breath.  07/20/15  Yes [provider]  traZODone (DESYREL) 50 MG tablet Take 50 mg by mouth at bedtime.   Yes [provider]  Vitamin D, Ergocalciferol, (DRISDOL) 50000 UNITS CAPS capsule Take 50,000 Units by mouth See admin instructions. Pt takes twice weekly. Tuesday and Friday   Yes [provider]  fluticasone (FLONASE) 50 MCG/ACT nasal spray Place 2 sprays into both nostrils daily for 30 days. Patient not taking: Reported on 02/27/2020 12/17/18 02/26/29  Minette Brine, FNP  HYDROcodone-homatropine (HYDROMET) 5-1.5 MG/5ML syrup Take 5 mLs by mouth every 6 (six) hours as needed. Patient not taking: Reported on 06/26/2019 12/24/18   Minette Brine, FNP      Results for orders placed or performed during the hospital encounter of 02/26/20 (from the past 48 hour(s))  CBC     Status: Abnormal   Collection Time: 02/26/20  4:02 PM  Result Value Ref Range   WBC 12.4 (H) 4.0 - 10.5 K/uL   RBC 4.01 3.87 - 5.11 MIL/uL  Hemoglobin 10.2 (L) 12.0 - 15.0 g/dL   HCT 32.3 (L) 36.0 - 46.0 %   MCV 80.5 80.0 - 100.0 fL   MCH 25.4 (L) 26.0 - 34.0 pg   MCHC 31.6 30.0 - 36.0 g/dL   RDW 18.6 (H) 11.5 - 15.5 %   Platelets 364 150 - 400 K/uL   nRBC 0.0 0.0 - 0.2 %    Comment: Performed at [facility] [address] [place], [place] [id number]  Basic metabolic panel     Status: Abnormal   Collection Time: 02/26/20  4:02 PM  Result Value Ref Range   Sodium 132 (L) 135 - 145 mmol/L   Potassium 3.8 3.5 - 5.1 mmol/L   Chloride 97 (L) 98 - 111 mmol/L   CO2 26 22 - 32 mmol/L   Glucose, Bld 101 (H) 70 - 99 mg/dL    Comment: Glucose reference range applies only to samples  taken after fasting for at least 8 hours.   BUN <5 (L) 8 - 23 mg/dL   Creatinine, Ser 0.69 0.44 - 1.00 mg/dL   Calcium 9.1 8.9 - 10.3 mg/dL   GFR calc non Af Amer >60 >60 mL/min   GFR calc Af Amer >60 >60 mL/min   Anion gap 9 5 - 15    Comment: Performed at [facility] [address] [place], [place] [address]   MR PELVIS W WO CONTRAST  Result Date: 02/25/2020 CLINICAL DATA:  Severe rectal pain for several weeks. EXAM: MRI PELVIS WITHOUT AND WITH CONTRAST TECHNIQUE: Multiplanar multisequence MR imaging of the pelvis was performed both before and after administration of intravenous contrast. CONTRAST:  66mL GADAVIST GADOBUTROL 1 MMOL/ML IV SOLN COMPARISON:  CT abdomen/pelvis 04/07/2017 FINDINGS: Urinary Tract: The bladder is unremarkable. No bladder mass or asymmetric bladder wall thickening. No bladder calculi. Bowel: Severe inflammation surrounding the lower rectum along with a large complex thick wall horseshoe shaped perianal abscess. This measures a maximum of 5.6 x 5.0 x 5.4 cm. This is most likely secondary to a perianal fistula although it is difficult to identify a discrete fistula because of the large compressive abscess and severe associated inflammatory changes. On the T2 weighted axial sequence I suspect there is a small fistula at the 6 o'clock position. Associated significant surrounding cellulitis involving the perineum and medial gluteal regions. I do not see any findings for a rectal mass. There is fairly significant inflammation involving the lower rectum along with the perirectal fat and ischial rectal fossa. Vascular/Lymphatic: The major vascular structures are unremarkable. I do not see any lower pelvic or inguinal lymphadenopathy. Reproductive: The patient has had a hysterectomy. I do not see any definite involvement of the vagina. Other: Small amount of free pelvic/presacral fluid but no intra-abdominal abscess is identified. Musculoskeletal: No findings suspicious for  septic arthritis or osteomyelitis. IMPRESSION: 1. Large complex thick wall horseshoe shaped perianal abscess. This measures a maximum of 5.6 x 5.0 x 5.4 cm. This is most likely secondary to a perianal fistula although it is difficult to identify a discrete fistula because of the large compressive abscess and severe associated inflammatory changes. Suspect a perianal fistula at the 6 o'clock position. 2. Associated significant inflammation surrounding the lower rectum, perineum and medial gluteal regions. 3. Small amount of free pelvic/presacral fluid but no intra pelvic abscess. 4. No findings for septic arthritis or osteomyelitis. Electronically Signed   By: Marijo Sanes M.D.   On: 02/25/2020 18:13    Review of  Systems Constitutional: Negative for chills and fever.  Respiratory: Negative for shortness of breath.   Cardiovascular: Negative for chest pain.  Gastrointestinal: Positive for rectal pain. Negative for anal bleeding, blood in stool, diarrhea, nausea and vomiting.  Genitourinary: Negative for dysuria.  Neurological: Negative for syncope.  All other systems reviewed and are negative.  Blood pressure (!) 147/64, pulse 82, temperature 98.8 F (37.1 C), temperature source Oral, resp. rate 14, height 5\' 2"  (1.575 m), weight 53.5 kg, SpO2 98 %. Physical Exam  Constitutional:  WDWN in NAD, conversant, no obvious deformities; lying in bed on her side Eyes:  Pupils equal, round; sclera anicteric; moist conjunctiva; no lid lag HENT:  Oral mucosa moist; good dentition  Neck:  No masses palpated, trachea midline; no thyromegaly Lungs:  CTA bilaterally; normal respiratory effort CV:  Regular rate and rhythm; no murmurs; extremities well-perfused with no edema Abd:  +bowel sounds, soft, non-tender, no palpable organomegaly; no palpable hernias Rectal:  Swelling, induration, tenderness posterior perianal region Musc:  Unable to assess gait; no apparent clubbing or cyanosis in  extremities Lymphatic:  No palpable cervical or axillary lymphadenopathy Skin:  Warm, dry; no sign of jaundice Psychiatric - alert and oriented x 4; calm mood and affect  Assessment/Plan Perirectal abscess/ possible fistula  Incision and drainage of perirectal abscess.  The surgical procedure has been discussed with the patient.  Potential risks, benefits, alternative treatments, and expected outcomes have been explained.  All of the patient's questions at this time have been answered.  The likelihood of reaching the patient's treatment goal is good.  The patient understand the proposed surgical procedure and wishes to proceed.   Maia Petties, MD 02/27/2020, 7:33 AM

## 2020-02-27 NOTE — ED Notes (Signed)
Report given to short stay RN. Obtained consent for procedure. Pt's shirt, pants, necklace, earrings, watch, shoes, sent with pt in pt belonging bag to short stay.

## 2020-02-27 NOTE — ED Provider Notes (Signed)
[facility] Provider Note   CSN: [id number] Arrival date & time: 02/26/20  1518     History Chief Complaint  Patient presents with  . Abscess    Maureen Rodgers is a 69 y.o. female with a history of GERD & tobacco abuse who presents to the ED with complaints of rectal pain x 1 week. Patient states she has had progressive worsening of pain to the rectal area with associated swelling. Discomfort radiates to the groin. Pain is constant, worse in certain positions/with certain movements as well as with BMs, no alleviating factors. States she saw gastroenterologist Dr. Collene Mares and had outpatient imaging performed that revealed an abscess. Patient states she was told she'd need to see a surgeon, but could not be seen until June 17th therefore she was told to come to the ED. Last BM was shortly prior to my assessment, states this was a small amount of stool and there was some pus that came out with it. She has been passing gas. Denies fever, chills, N/V, melena, hematochezia, or dysuria.   HPI     Past Medical History:  Diagnosis Date  . GERD (gastroesophageal reflux disease)   . Tobacco use     Patient Active Problem List   Diagnosis Date Noted  . Abnormal glucose 12/24/2018  . Mixed hyperlipidemia 12/24/2018  . Acute bronchitis 12/24/2018    Past Surgical History:  Procedure Laterality Date  . ABDOMINAL HYSTERECTOMY    . BILATERAL CARPAL TUNNEL RELEASE  2018   2018 on Right, many years ago on left  . EYE SURGERY       OB History   No obstetric history on file.     Family History  Problem Relation Age of Onset  . Heart disease Mother   . Dementia Mother   . Alcohol abuse Mother   . Alcohol abuse Father   . Diabetes Brother     Social History   Tobacco Use  . Smoking status: Current Every Day Smoker    Packs/day: 0.50    Years: 44.00    Pack years: 22.00    Types: Cigarettes    Start date: 70  . Smokeless tobacco: Never  Used  . Tobacco comment: 10 cigs/day, will decrease number of cigs smoked/day  Substance Use Topics  . Alcohol use: Yes    Alcohol/week: 3.0 standard drinks    Types: 3 Cans of beer per week    Comment: 3 beers a day  . Drug use: No    Home Medications Prior to Admission medications   Medication Sig Start Date End Date Taking? Authorizing Provider  alendronate (FOSAMAX) 70 MG tablet TAKE 1 TABLET BY MOUTH ONE TIME PER WEEK 11/04/19   Glendale Chard, MD  ALPRAZolam Duanne Moron) 0.5 MG tablet Take 0.5 mg by mouth at bedtime as needed for sleep (anxiety).    [provider]  DULoxetine (CYMBALTA) 30 MG capsule Take 1 capsule (30 mg total) by mouth daily. 08/14/19 08/13/20  Glendale Chard, MD  esomeprazole (NEXIUM) 40 MG capsule Take 40 mg by mouth daily before breakfast.    [provider]  fluticasone (FLONASE) 50 MCG/ACT nasal spray Place 1 spray into both nostrils daily. 09/07/18   Minette Brine, FNP  fluticasone (FLONASE) 50 MCG/ACT nasal spray Place 2 sprays into both nostrils daily for 30 days. Patient not taking: Reported on 12/24/2018 12/17/18 01/16/19  Minette Brine, FNP  HYDROcodone-homatropine (HYDROMET) 5-1.5 MG/5ML syrup Take 5 mLs by mouth every 6 (  six) hours as needed. Patient not taking: Reported on 06/26/2019 12/24/18   Minette Brine, FNP  Omega-3 Fatty Acids (FISH OIL) 1000 MG CAPS Take 1,000 mg by mouth daily.    [provider]  PROAIR HFA 108 [phone] Base) MCG/ACT inhaler INHALE 2 PUFFS EVERY 4-6 HOURS 07/20/15   [provider]  traZODone (DESYREL) 50 MG tablet Take 50 mg by mouth at bedtime.    [provider]  Vitamin D, Ergocalciferol, (DRISDOL) 50000 UNITS CAPS capsule Take 50,000 Units by mouth See admin instructions. Pt takes twice weekly. Tuesday and Friday    [provider]    Allergies    Patient has no known allergies.  Review of Systems   Review of Systems  Constitutional: Negative for chills and fever.   Respiratory: Negative for shortness of breath.   Cardiovascular: Negative for chest pain.  Gastrointestinal: Positive for rectal pain. Negative for anal bleeding, blood in stool, diarrhea, nausea and vomiting.  Genitourinary: Negative for dysuria.  Neurological: Negative for syncope.  All other systems reviewed and are negative.   Physical Exam Updated Vital Signs BP (!) 147/64   Pulse 82   Temp 98.8 F (37.1 C) (Oral)   Resp 14   Ht 5\' 2"  (1.575 m)   Wt 53.5 kg   SpO2 98%   BMI 21.58 kg/m   Physical Exam Vitals and nursing note reviewed. Exam conducted with a chaperone present.  Constitutional:      General: She is not in acute distress.    Appearance: She is well-developed. She is not toxic-appearing.  HENT:     Head: Normocephalic and atraumatic.  Eyes:     General:        Right eye: No discharge.        Left eye: No discharge.     Conjunctiva/sclera: Conjunctivae normal.  Cardiovascular:     Rate and Rhythm: Normal rate and regular rhythm.  Pulmonary:     Effort: Pulmonary effort is normal. No respiratory distress.     Breath sounds: Normal breath sounds. No wheezing, rhonchi or rales.  Abdominal:     General: There is no distension.     Palpations: Abdomen is soft.     Tenderness: There is no abdominal tenderness. There is no guarding or rebound.  Genitourinary:      Comments: Pain with palpation of posterior rectal wall with DRE.  Musculoskeletal:     Cervical back: Neck supple.  Skin:    General: Skin is warm and dry.     Findings: No rash.  Neurological:     Mental Status: She is alert.     Comments: Clear speech.   Psychiatric:        Behavior: Behavior normal.     ED Results / Procedures / Treatments   Labs (all labs ordered are listed, but only abnormal results are displayed) Labs Reviewed  CBC - Abnormal; Notable for the following components:      Result Value   WBC 12.4 (*)    Hemoglobin 10.2 (*)    HCT 32.3 (*)    MCH 25.4 (*)    RDW  18.6 (*)    All other components within normal limits  BASIC METABOLIC PANEL - Abnormal; Notable for the following components:   Sodium 132 (*)    Chloride 97 (*)    Glucose, Bld 101 (*)    BUN <5 (*)    All other components within normal limits    EKG None  Radiology MR PELVIS W WO CONTRAST  Result Date: 02/25/2020 CLINICAL DATA:  Severe rectal pain for several weeks. EXAM: MRI PELVIS WITHOUT AND WITH CONTRAST TECHNIQUE: Multiplanar multisequence MR imaging of the pelvis was performed both before and after administration of intravenous contrast. CONTRAST:  84mL GADAVIST GADOBUTROL 1 MMOL/ML IV SOLN COMPARISON:  CT abdomen/pelvis 04/07/2017 FINDINGS: Urinary Tract: The bladder is unremarkable. No bladder mass or asymmetric bladder wall thickening. No bladder calculi. Bowel: Severe inflammation surrounding the lower rectum along with a large complex thick wall horseshoe shaped perianal abscess. This measures a maximum of 5.6 x 5.0 x 5.4 cm. This is most likely secondary to a perianal fistula although it is difficult to identify a discrete fistula because of the large compressive abscess and severe associated inflammatory changes. On the T2 weighted axial sequence I suspect there is a small fistula at the 6 o'clock position. Associated significant surrounding cellulitis involving the perineum and medial gluteal regions. I do not see any findings for a rectal mass. There is fairly significant inflammation involving the lower rectum along with the perirectal fat and ischial rectal fossa. Vascular/Lymphatic: The major vascular structures are unremarkable. I do not see any lower pelvic or inguinal lymphadenopathy. Reproductive: The patient has had a hysterectomy. I do not see any definite involvement of the vagina. Other: Small amount of free pelvic/presacral fluid but no intra-abdominal abscess is identified. Musculoskeletal: No findings suspicious for septic arthritis or osteomyelitis. IMPRESSION: 1.  Large complex thick wall horseshoe shaped perianal abscess. This measures a maximum of 5.6 x 5.0 x 5.4 cm. This is most likely secondary to a perianal fistula although it is difficult to identify a discrete fistula because of the large compressive abscess and severe associated inflammatory changes. Suspect a perianal fistula at the 6 o'clock position. 2. Associated significant inflammation surrounding the lower rectum, perineum and medial gluteal regions. 3. Small amount of free pelvic/presacral fluid but no intra pelvic abscess. 4. No findings for septic arthritis or osteomyelitis. Electronically Signed   By: Marijo Sanes M.D.   On: 02/25/2020 18:13   Procedures Procedures (including critical care time)  Medications Ordered in ED Medications - No data to display  ED Course  I have reviewed the triage vital signs and the nursing notes.  Pertinent labs & imaging results that were available during my care of the patient were reviewed by me and considered in my medical decision making (see chart for details).    MDM Rules/Calculators/A&P                     Patient presents to the ED with complaints of 1 week of progressively worsening rectal pain. Patient is nontoxic appearing, resting comfortably. On exam she has induration with tenderness to palpation to the posterior perianal area with tenderness on DRE posteriorly. No active drainage.   Additional history obtained:  Additional history obtained from nursing note review. Previous records obtained and reviewed, patient had outpatient MRI pelvis w/wo contrast 02/25/20: Large complex thick wall horseshoe shaped perianal abscess. This measures a maximum of 5.6 x 5.0 x 5.4 cm. This is most likely secondary to a perianal fistula although it is difficult to identify a discrete fistula because of the large compressive abscess and severe associated inflammatory changes. Suspect a perianal fistula at the 6 o'clock position. 2. Associated significant  inflammation surrounding the lower rectum, perineum and medial gluteal regions. 3. Small amount of free pelvic/presacral fluid but no intra pelvic abscess. 4. No findings for septic  arthritis or osteomyelitis. ---> I have personally reviewed & interpreted imaging.   Lab Tests:  I Ordered, reviewed, and interpreted labs, which included:  CBC: Mild leukocytosis @ 12.4. Anemia is new compared to 4 years prior, patient does relay that she gets regular colonoscopies and has had polyps removed several times.  BMP: Mild hyponatremia/chloremia, no significant electrolyte derangement.  COVID testing: pending   ED Course:  05:26: CONSULT: Discussed case with general surgeon Dr. Kieth Brightly, general surgery team to see.   06:35: Patient care signed out to [facility] PA-C at change of shift pending general surgery evaluation.   This is a shared visit with supervising physician Dr. Christy Gentles who has independently evaluated patient & provided guidance in evaluation/management/disposition, in agreement with care   Portions of this note were generated with Dragon dictation software. Dictation errors may occur despite best attempts at proofreading.  Final Clinical Impression(s) / ED Diagnoses Final diagnoses:  Perianal abscess    Rx / DC Orders ED Discharge Orders    None       Amaryllis Dyke, PA-C 02/27/20 [id number]    Ripley Fraise, MD 02/27/20 [phone]

## 2020-02-27 NOTE — Progress Notes (Signed)
Received patient from PACU. Patient alert and oriented. Dressing clean, dry, and intact. Patient oriented to call bell and bed controls. Instructed patient not to self ambulate and to utilize call bell for assistance. Will continue to monitor.  

## 2020-02-27 NOTE — Progress Notes (Signed)
Pharmacy Antibiotic Note  Maureen Rodgers is a 69 y.o. female admitted on 02/26/2020 with perianal abscess.  Pharmacy has been consulted for Zosyn dosing. WBC 12.4. SCr wnl.   Plan: -Zosyn 3.375 gm IV Q 8 hours  -Monitor CBC, renal fx, cultures and clinical progress  Height: 5\' 2"  (157.5 cm) Weight: 53.5 kg (118 lb) IBW/kg (Calculated) : 50.1  Temp (24hrs), Avg:98.6 F (37 C), Min:98.4 F (36.9 C), Max:98.8 F (37.1 C)  Recent Labs  Lab 02/26/20 1602  WBC 12.4*  CREATININE 0.69    Estimated Creatinine Clearance: 53.2 mL/min (by C-G formula based on SCr of 0.69 mg/dL).    No Known Allergies  Antimicrobials this admission: Zosyn 5/27 >>   Dose adjustments this admission:   Microbiology results:   Thank you for allowing pharmacy to be a part of this patient's care.  Albertina Parr, PharmD., BCPS, BCCCP Clinical Pharmacist Clinical phone for 02/24/20 until 3:30pm: [phone] If after 3:30pm, please refer to [facility] for unit-specific pharmacist

## 2020-02-27 NOTE — H&P (Signed)
Reason for Consult: perianal pain Referring Physician: Michelene Gardener Maureen Rodgers is an 69 y.o. female.  HPI: 69 yo female with 1 week of perianal pain and swelling. Pain is constant and intense. She has radiation down her leg. She has chills but denies fevers. She has never had skin infections before. She does not have diabetes. She smokes 1/2 ppd.  Past Medical History:  Diagnosis Date  . GERD (gastroesophageal reflux disease)   . Tobacco use     Past Surgical History:  Procedure Laterality Date  . ABDOMINAL HYSTERECTOMY    . BILATERAL CARPAL TUNNEL RELEASE  2018   2018 on Right, many years ago on left  . EYE SURGERY      Family History  Problem Relation Age of Onset  . Heart disease Mother   . Dementia Mother   . Alcohol abuse Mother   . Alcohol abuse Father   . Diabetes Brother     Social History:  reports that she has been smoking cigarettes. She started smoking about 45 years ago. She has a 22.00 pack-year smoking history. She has never used smokeless tobacco. She reports current alcohol use of about 3.0 standard drinks of alcohol per week. She reports that she does not use drugs.  Allergies: No Known Allergies  Medications: I have reviewed the patient's current medications.  Results for orders placed or performed during the hospital encounter of 02/26/20 (from the past 48 hour(s))  CBC     Status: Abnormal   Collection Time: 02/26/20  4:02 PM  Result Value Ref Range   WBC 12.4 (H) 4.0 - 10.5 K/uL   RBC 4.01 3.87 - 5.11 MIL/uL   Hemoglobin 10.2 (L) 12.0 - 15.0 g/dL   HCT 32.3 (L) 36.0 - 46.0 %   MCV 80.5 80.0 - 100.0 fL   MCH 25.4 (L) 26.0 - 34.0 pg   MCHC 31.6 30.0 - 36.0 g/dL   RDW 18.6 (H) 11.5 - 15.5 %   Platelets 364 150 - 400 K/uL   nRBC 0.0 0.0 - 0.2 %    Comment: Performed at [facility], [address], [place], [place] [id number]  Basic metabolic panel     Status: Abnormal   Collection Time: 02/26/20  4:02 PM  Result Value Ref  Range   Sodium 132 (L) 135 - 145 mmol/L   Potassium 3.8 3.5 - 5.1 mmol/L   Chloride 97 (L) 98 - 111 mmol/L   CO2 26 22 - 32 mmol/L   Glucose, Bld 101 (H) 70 - 99 mg/dL    Comment: Glucose reference range applies only to samples taken after fasting for at least 8 hours.   BUN <5 (L) 8 - 23 mg/dL   Creatinine, Ser 0.69 0.44 - 1.00 mg/dL   Calcium 9.1 8.9 - 10.3 mg/dL   GFR calc non Af Amer >60 >60 mL/min   GFR calc Af Amer >60 >60 mL/min   Anion gap 9 5 - 15    Comment: Performed at [facility] [address], [place], [place] [address]    MR PELVIS W WO CONTRAST  Result Date: 02/25/2020 CLINICAL DATA:  Severe rectal pain for several weeks. EXAM: MRI PELVIS WITHOUT AND WITH CONTRAST TECHNIQUE: Multiplanar multisequence MR imaging of the pelvis was performed both before and after administration of intravenous contrast. CONTRAST:  68mL GADAVIST GADOBUTROL 1 MMOL/ML IV SOLN COMPARISON:  CT abdomen/pelvis 04/07/2017 FINDINGS: Urinary Tract: The bladder is unremarkable. No bladder mass  or asymmetric bladder wall thickening. No bladder calculi. Bowel: Severe inflammation surrounding the lower rectum along with a large complex thick wall horseshoe shaped perianal abscess. This measures a maximum of 5.6 x 5.0 x 5.4 cm. This is most likely secondary to a perianal fistula although it is difficult to identify a discrete fistula because of the large compressive abscess and severe associated inflammatory changes. On the T2 weighted axial sequence I suspect there is a small fistula at the 6 o'clock position. Associated significant surrounding cellulitis involving the perineum and medial gluteal regions. I do not see any findings for a rectal mass. There is fairly significant inflammation involving the lower rectum along with the perirectal fat and ischial rectal fossa. Vascular/Lymphatic: The major vascular structures are unremarkable. I do not see any lower pelvic or inguinal lymphadenopathy. Reproductive:  The patient has had a hysterectomy. I do not see any definite involvement of the vagina. Other: Small amount of free pelvic/presacral fluid but no intra-abdominal abscess is identified. Musculoskeletal: No findings suspicious for septic arthritis or osteomyelitis. IMPRESSION: 1. Large complex thick wall horseshoe shaped perianal abscess. This measures a maximum of 5.6 x 5.0 x 5.4 cm. This is most likely secondary to a perianal fistula although it is difficult to identify a discrete fistula because of the large compressive abscess and severe associated inflammatory changes. Suspect a perianal fistula at the 6 o'clock position. 2. Associated significant inflammation surrounding the lower rectum, perineum and medial gluteal regions. 3. Small amount of free pelvic/presacral fluid but no intra pelvic abscess. 4. No findings for septic arthritis or osteomyelitis. Electronically Signed   By: Marijo Sanes M.D.   On: 02/25/2020 18:13    Review of Systems  Constitutional: Positive for chills. Negative for fever.  HENT: Negative for hearing loss.   Eyes: Negative for blurred vision and double vision.  Respiratory: Negative for cough and hemoptysis.   Cardiovascular: Negative for chest pain and palpitations.  Gastrointestinal: Negative for abdominal pain, nausea and vomiting.  Genitourinary: Negative for dysuria and urgency.  Musculoskeletal: Negative for myalgias and neck pain.  Skin: Negative for itching and rash.  Neurological: Negative for dizziness, tingling and headaches.  Endo/Heme/Allergies: Does not bruise/bleed easily.  Psychiatric/Behavioral: Negative for depression and suicidal ideas.    PE Blood pressure (!) 147/64, pulse 82, temperature 98.8 F (37.1 C), temperature source Oral, resp. rate 14, height 5\' 2"  (1.575 m), weight 53.5 kg, SpO2 98 %. Constitutional: NAD; conversant; no deformities Eyes: Moist conjunctiva; no lid lag; anicteric; PERRL Neck: Trachea midline; no thyromegaly Lungs:  Normal respiratory effort; no tactile fremitus CV: RRR; no palpable thrills; no pitting edema GI: Abd soft, NT; no palpable hepatosplenomegaly MSK: Normal gait; no clubbing/cyanosis Psychiatric: Appropriate affect; alert and oriented x3 Lymphatic: No palpable cervical or axillary lymphadenopathy Rectal: edema and induration in the right perirectal space   Assessment/Plan: 69 yo female with perirectal abscess with horseshoe appearance on CT scan. -admit to surgical floor -incision and drainage today -broad spectrum antibiotics -discussed details of procedure including postoperative wound care, risk of sphincter injury, recurrence and possible underlying fistula. She showed good understanding and wanted to proceed.  Maureen Rodgers 02/27/2020, 7:34 AM

## 2020-02-27 NOTE — Anesthesia Postprocedure Evaluation (Signed)
Anesthesia Post Note  Patient: Maureen Rodgers  Procedure(s) Performed: IRRIGATION AND DEBRIDEMENT PERIRECTAL ABSCESS (N/A Perineum)     Patient location during evaluation: PACU Anesthesia Type: General Level of consciousness: sedated Pain management: pain level controlled Vital Signs Assessment: post-procedure vital signs reviewed and stable Respiratory status: spontaneous breathing and respiratory function stable Cardiovascular status: stable Postop Assessment: no apparent nausea or vomiting Anesthetic complications: no    Last Vitals:  Vitals:   02/27/20 0935 02/27/20 0950  BP: 115/66 119/74  Pulse: 67 62  Resp: 14 13  Temp:  37.3 C  SpO2: 98% 100%    Last Pain:  Vitals:   02/27/20 0950  TempSrc:   PainSc: 0-No pain                 Jamesa Tedrick DANIEL

## 2020-02-27 NOTE — Care Management Obs Status (Signed)
[facility] NOTIFICATION   Patient Details  Name: Maureen Rodgers MRN: [id number] Date of Birth: 1951/04/04   Medicare Observation Status Notification Given:  Yes    Verdell Carmine, RN 02/27/2020, 5:31 PM

## 2020-02-27 NOTE — Discharge Instructions (Signed)
Anorectal Abscess Clean site with soap and water.  Keep a dry dressing over the drain. Sitz bath 2-3 times per day. An abscess is an infected area that contains a collection of pus. An anorectal abscess is an abscess that is near the opening of the anus or around the rectum. Without treatment, an anorectal abscess can become larger and cause other problems, such as a more serious body-wide infection or pain, especially during bowel movements. What are the causes? This condition is caused by plugged glands or an infection in one of these areas:  The anus.  The area between the anus and the scrotum in males or between the anus and the vagina in females (perineum). What increases the risk? The following factors may make you more likely to develop this condition:  Diabetes or inflammatory bowel disease.  Having a body defense system (immune system) that is weak.  Engaging in anal sex.  Having a sexually transmitted infection (STI).  Certain kinds of cancer, such as rectal carcinoma, leukemia, or lymphoma. What are the signs or symptoms? The main symptom of this condition is pain. The pain may be a throbbing pain that gets worse during bowel movements. Other symptoms include:  Swelling and redness in the area of the abscess. The redness may go beyond the abscess and appear as a red streak on the skin.  A visible, painful lump, or a lump that can be felt when touched.  Bleeding or pus-like discharge from the area.  Fever.  General weakness.  Constipation.  Diarrhea. How is this diagnosed? This condition is diagnosed based on your medical history and a physical exam of the affected area.  This may involve examining the rectal area with a gloved hand (digital rectal exam).  Sometimes, the health care provider needs to look into the rectum using a probe, scope, or imaging test.  For women, it may require a careful vaginal exam. How is this treated? Treatment for this condition  may include:  Incision and drainage surgery. This involves making an incision over the abscess to drain the pus.  Medicines, including antibiotic medicine, pain medicine, stool softeners, or laxatives. Follow these instructions at home: Medicines  Take over-the-counter and prescription medicines only as told by your health care provider.  If you were prescribed an antibiotic medicine, use it as told by your health care provider. Do not stop using the antibiotic even if you start to feel better.  Do not drive or use heavy machinery while taking prescription pain medicine. Wound care   If gauze was used in the abscess, follow instructions from your health care provider about removing or changing the gauze. It can usually be removed in 2-3 days.  Wash your hands with soap and water before you remove or change your gauze. If soap and water are not available, use hand sanitizer.  If one or more drains were placed in the abscess cavity, be careful not to pull at them. Your health care provider will tell you how long they need to remain in place.  Check your incision area every day for signs of infection. Check for: ? More redness, swelling, or pain. ? More fluid or blood. ? Warmth. ? Pus or a bad smell. Managing pain, stiffness, and swelling   Take a sitz bath 3-4 times a day and after bowel movements. This will help reduce pain and swelling.  To relieve pain, try sitting: ? On a heating pad with the setting on low. ? On an inflatable  donut-shaped cushion.  If directed, put ice on the affected area: ? Put ice in a plastic bag. ? Place a towel between your skin and the bag. ? Leave the ice on for 20 minutes, 2-3 times a day. General instructions  Follow any diet instructions given by your health care provider.  Keep all follow-up visits as told by your health care provider. This is important. Contact a health care provider if you have:  Bleeding from your incision.  Pain,  swelling, or redness that does not improve or gets worse.  Trouble passing stool or urine.  Symptoms that return after treatment. Get help right away if you:  Have problems moving or using your legs.  Have severe or increasing pain.  Have swelling in the affected area that suddenly gets worse.  Have a large increase in bleeding or passing of pus.  Develop chills or a fever. Summary  An anorectal abscess is an abscess that is near the opening of the anus or around the rectum. An abscess is an infected area that contains a collection of pus.  The main symptom of this condition is pain. It may be a throbbing pain that gets worse during bowel movements.  Treatment for an anorectal abscess may include surgery to drain the pus from the abscess. Medicines and sitz baths may also be a part of your treatment plan. This information is not intended to replace advice given to you by your health care provider. Make sure you discuss any questions you have with your health care provider. Document Revised: 10/26/2017 Document Reviewed: 10/26/2017 Elsevier Patient Education  2020 [employer].   How to Take a [employer] After soaking you can shower and clean site again with soap and water. A sitz bath is a warm water bath that may be used to care for your rectum, genital area, or the area between your rectum and genitals (perineum). For a sitz bath, the water only comes up to your hips and covers your buttocks. A sitz bath may done at home in a bathtub or with a portable sitz bath that fits over the toilet. Your health care provider may recommend a sitz bath to help:  Relieve pain and discomfort after delivering a baby.  Relieve pain and itching from hemorrhoids or anal fissures.  Relieve pain after certain surgeries.  Relax muscles that are sore or tight. How to take a sitz bath Take 3-4 sitz baths a day, or as many as told by your health care provider. Bathtub sitz bath To take a sitz  bath in a bathtub: 1. Partially fill a bathtub with warm water. The water should be deep enough to cover your hips and buttocks when you are sitting in the tub. 2. If your health care provider told you to put medicine in the water, follow his or her instructions. 3. Sit in the water. 4. Open the tub drain a little, and leave it open during your bath. 5. Turn on the warm water again, enough to replace the water that is draining out. Keep the water running throughout your bath. This helps keep the water at the right level and the right temperature. 6. Soak in the water for 15-20 minutes, or as long as told by your health care provider. 7. When you are done, be careful when you stand up. You may feel dizzy. 8. After the sitz bath, pat yourself dry. Do not rub your skin to dry it.  Over-the-toilet sitz bath To take a sitz  bath with an over-the-toilet basin: 1. Follow the manufacturer's instructions. 2. Fill the basin with warm water. 3. If your health care provider told you to put medicine in the water, follow his or her instructions. 4. Sit on the seat. Make sure the water covers your buttocks and perineum. 5. Soak in the water for 15-20 minutes, or as long as told by your health care provider. 6. After the sitz bath, pat yourself dry. Do not rub your skin to dry it. 7. Clean and dry the basin between uses. 8. Discard the basin if it cracks, or according to the manufacturer's instructions. Contact a health care provider if:  Your symptoms get worse. Do not continue with sitz baths if your symptoms get worse.  You have new symptoms. If this happens, do not continue with sitz baths until you talk with your health care provider. Summary  A sitz bath is a warm water bath in which the water only comes up to your hips and covers your buttocks.  A sitz bath may help relieve itching, relieve pain, and relax muscles that are sore or tight in the lower part of your body, including your genital  area.  Take 3-4 sitz baths a day, or as many as told by your health care provider. Soak in the water for 15-20 minutes.  Do not continue with sitz baths if your symptoms get worse. This information is not intended to replace advice given to you by your health care provider. Make sure you discuss any questions you have with your health care provider. Document Revised: 02/18/2019 Document Reviewed: 09/21/2017 Elsevier Patient Education  2020 [place] _______[facility]  RECTAL SURGERY POST OP INSTRUCTIONS: POST OP INSTRUCTIONS  Always review your discharge instruction sheet given to you by the facility where your surgery was performed. IF YOU HAVE DISABILITY OR FAMILY LEAVE FORMS, YOU MUST BRING THEM TO THE OFFICE FOR PROCESSING.   DO NOT GIVE THEM TO YOUR DOCTOR.  1. A  prescription for pain medication may be given to you upon discharge.  Take your pain medication as prescribed, if needed.  If narcotic pain medicine is not needed, then you may take acetaminophen (Tylenol) or ibuprofen (Advil) as needed. 2. Take your usually prescribed medications unless otherwise directed. 3. If you need a refill on your pain medication, please contact your pharmacy.  They will contact our office to request authorization. Prescriptions will not be filled after 5 pm or on week-ends. 4. You should follow a light diet the first 48 hours after arrival home, such as soup and crackers, etc.  Be sure to include lots of fluids daily.  Resume your normal diet 2-3 days after surgery.. 5. Most patients will experience some swelling and discomfort in the rectal area. Ice packs, reclining and warm tub soaks will help.  Swelling and discomfort can take several days to resolve.  6. It is common to experience some constipation if taking pain medication after surgery.  Increasing fluid intake and taking a stool softener (such as Colace) will usually help or prevent this problem from occurring.  A mild  laxative (Milk of Magnesia or Miralax) should be taken according to package directions if there are no bowel movements after 48 hours. 7. Unless discharge instructions indicate otherwise, leave your bandage dry and in place for 24 hours, or remove the bandage if you have a bowel movement. You may notice a small amount of bleeding with bowel movements for the first few days.  You may have some packing in the rectum which will come out over the first day or two. You will need to wear an absorbent pad or soft cotton gauze in your underwear until the drainage stops.it. 8. ACTIVITIES:  You may resume regular (light) daily activities beginning the next day--such as daily self-care, walking, climbing stairs--gradually increasing activities as tolerated.  You may have sexual intercourse when it is comfortable.  Refrain from any heavy lifting or straining until approved by your doctor. a. You may drive when you are no longer taking prescription pain medication, you can comfortably wear a seatbelt, and you can safely maneuver your car and apply brakes. b. RETURN TO WORK: : ____________________ c.  9. You should see your doctor in the office for a follow-up appointment approximately 2-3 weeks after your surgery.  Make sure that you call for this appointment within a day or two after you arrive home to insure a convenient appointment time. 10. OTHER INSTRUCTIONS:  __________________________________________________________________________________________________________________________________________________________________________________________  WHEN TO CALL YOUR DOCTOR: 1. Fever over 101.0 2. Inability to urinate 3. Nausea and/or vomiting 4. Extreme swelling or bruising 5. Continued bleeding from rectum. 6. Increased pain, redness, or drainage from the incision 7. Constipation  The clinic staff is available to answer your questions during regular business hours.  Please don't hesitate to call and ask to  speak to one of the nurses for clinical concerns.  If you have a medical emergency, go to the nearest emergency room or call 911.  A surgeon from [facility] is always on call at the hospital   [address], [place], [place], [place]  [address] ?  [address], [place], [place]   [address] [phone] ? [phone] ? FAX [phone] Web site: [email or web address]

## 2020-02-27 NOTE — ED Provider Notes (Signed)
Patient seen/examined in the Emergency Department in conjunction with Advanced Practice Provider  Patient reports rectal pain.  Patient found to have perianal abscess Exam : Awake alert, no acute distress. Plan: MRI performed as outpatient reveals large perianal abscess.  General surgery has been called    Ripley Fraise, MD 02/27/20 [phone]

## 2020-02-27 NOTE — Transfer of Care (Signed)
Immediate Anesthesia Transfer of Care Note  Patient: Maureen Rodgers  Procedure(s) Performed: IRRIGATION AND DEBRIDEMENT PERIRECTAL ABSCESS (N/A Perineum)  Patient Location: PACU  Anesthesia Type:General  Level of Consciousness: awake, alert , oriented, patient cooperative and responds to stimulation  Airway & Oxygen Therapy: Patient Spontanous Breathing  Post-op Assessment: Report given to RN and Post -op Vital signs reviewed and stable  Post vital signs: Reviewed and stable  Last Vitals:  Vitals Value Taken Time  BP 97/73 02/27/20 0922  Temp 37.4 C 02/27/20 0920  Pulse 75 02/27/20 0922  Resp 16 02/27/20 0922  SpO2 99 % 02/27/20 0922  Vitals shown include unvalidated device data.  Last Pain:  Vitals:   02/27/20 0920  TempSrc:   PainSc: 0-No pain      Patients Stated Pain Goal: 0 ([id number])  Complications: No apparent anesthesia complications

## 2020-02-27 NOTE — Op Note (Signed)
Preop diagnosis: Perirectal abscess Postop diagnosis: Same Procedure performed: Incision and drainage of large posterior perirectal abscess Surgeon:Sherell Christoffel K Maymie Brunke Anesthesia: General Indications: This is a 69 year old female who presents with a 1 week history of worsening perirectal pain and swelling.  MRI was obtained that showed a 5 cm abscess posteriorly that almost look like a horseshoe shaped.  She presented to the emergency department for evaluation.  We were asked to see the patient this morning for surgical drainage.  Description of procedure: The patient is brought to the operating room and placed in supine position on the operating room table.  After an adequate level of general anesthesia was obtained, her legs were placed in the lithotomy position in stirrups.  Her perineum was prepped with Betadine and draped sterile fashion.  A timeout was taken to ensure the proper patient and proper procedure.  The abscess is easily palpable with some overlying induration in the right posterior ischio rectal fossa.  Digital rectal examination was performed.  The patient has a fairly tight anal sphincter that would only allow passage of a small retractor.  Compression of the abscess revealed a small amount of purulence within the rectum but I could not identify the site of the fistula.  We anesthetized this area with Marcaine.  I made a 2 cm round incision in this area.  We entered the pocket of purulent fluid.  We evacuated completely.  I explored this with my finger and it seemed to track posteriorly across the midline but does not come up to the subcutaneous tissue or skin on the left.  We irrigated the entire abscess cavity thoroughly.  No further purulence was noted.  I brought 1/2 inch Penrose drain and inserted into the wound.  This was placed deep into the abscess cavity to allow further drainage.  This was secured with 2-0 nylon sutures.  Dry dressing was applied.  The patient was then extubated and  brought to the recovery room in stable condition.  All sponge, instrument, and needle counts are correct.  Imogene Burn. Georgette Dover, MD, [facility]  General/ Trauma Surgery   02/27/2020 9:28 AM

## 2020-02-28 DIAGNOSIS — K611 Rectal abscess: Secondary | ICD-10-CM | POA: Diagnosis not present

## 2020-02-28 LAB — CBC
HCT: 27.4 % — ABNORMAL LOW (ref 36.0–46.0)
Hemoglobin: 8.5 g/dL — ABNORMAL LOW (ref 12.0–15.0)
MCH: 25.1 pg — ABNORMAL LOW (ref 26.0–34.0)
MCHC: 31 g/dL (ref 30.0–36.0)
MCV: 80.8 fL (ref 80.0–100.0)
Platelets: 308 10*3/uL (ref 150–400)
RBC: 3.39 MIL/uL — ABNORMAL LOW (ref 3.87–5.11)
RDW: 18.5 % — ABNORMAL HIGH (ref 11.5–15.5)
WBC: 10.4 10*3/uL (ref 4.0–10.5)
nRBC: 0 % (ref 0.0–0.2)

## 2020-02-28 LAB — COMPREHENSIVE METABOLIC PANEL
ALT: 13 U/L (ref 0–44)
AST: 18 U/L (ref 15–41)
Albumin: 2.6 g/dL — ABNORMAL LOW (ref 3.5–5.0)
Alkaline Phosphatase: 47 U/L (ref 38–126)
Anion gap: 9 (ref 5–15)
BUN: 7 mg/dL — ABNORMAL LOW (ref 8–23)
CO2: 25 mmol/L (ref 22–32)
Calcium: 8.7 mg/dL — ABNORMAL LOW (ref 8.9–10.3)
Chloride: 99 mmol/L (ref 98–111)
Creatinine, Ser: 0.78 mg/dL (ref 0.44–1.00)
GFR calc Af Amer: >60 mL/min (ref >60)
GFR calc non Af Amer: >60 mL/min (ref >60)
Glucose, Bld: 136 mg/dL — ABNORMAL HIGH (ref 70–99)
Potassium: 3.7 mmol/L (ref 3.5–5.1)
Sodium: 133 mmol/L — ABNORMAL LOW (ref 135–145)
Total Bilirubin: 0.1 mg/dL — ABNORMAL LOW (ref 0.3–1.2)
Total Protein: 5.6 g/dL — ABNORMAL LOW (ref 6.5–8.1)

## 2020-02-28 LAB — HIV ANTIBODY (ROUTINE TESTING W REFLEX): HIV Screen 4th Generation wRfx: NONREACTIVE

## 2020-02-28 MED ORDER — OXYCODONE HCL 5 MG PO TABS: 5.0000 mg | ORAL_TABLET | Freq: Four times a day (QID) | ORAL | Status: DC | PRN

## 2020-02-28 MED ORDER — IBUPROFEN 400 MG PO TABS: 400.0000 mg | ORAL_TABLET | Freq: Four times a day (QID) | ORAL | 0 refills | Status: DC | PRN

## 2020-02-28 MED ORDER — POLYETHYLENE GLYCOL 3350 17 G PO PACK: 17.0000 g | PACK | Freq: Every day | ORAL | 0 refills | Status: DC | PRN

## 2020-02-28 MED ORDER — ACETAMINOPHEN 500 MG PO TABS: 1000.0000 mg | ORAL_TABLET | Freq: Three times a day (TID) | ORAL | 0 refills | Status: DC | PRN

## 2020-02-28 MED ORDER — OXYCODONE HCL 5 MG PO TABS: 5.0000 mg | ORAL_TABLET | Freq: Four times a day (QID) | ORAL | 0 refills | Status: DC | PRN

## 2020-02-28 MED ORDER — AMOXICILLIN-POT CLAVULANATE 875-125 MG PO TABS: 1.0000 | ORAL_TABLET | Freq: Two times a day (BID) | ORAL | 0 refills | Status: AC

## 2020-02-28 MED ORDER — MORPHINE SULFATE (PF) 2 MG/ML IV SOLN: 2.0000 mg | INTRAVENOUS | Status: DC | PRN

## 2020-02-28 NOTE — Progress Notes (Signed)
Patient discharged to home. Verbalizes understanding of all discharge instructions including incision/drain care, discharge medications, and follow up MD visits.

## 2020-02-28 NOTE — Discharge Summary (Signed)
[facility] Discharge Summary   Patient ID: ADALAYA BYRNES MRN: [id number] DOB/AGE: 02-08-1951 69 y.o.  Admit date: 02/26/2020 Discharge date: 02/28/2020  Admitting Diagnosis: Perirectal abscess/ possible fistula  Discharge Diagnosis Perirectal abscess  Consultants None  Imaging: No results found.  Procedures Dr. Georgette Dover (02/27/2020) - Incision and drainage of large posterior perirectal abscess  Hospital Course:  Maureen Rodgers is a 69yo female who presented to [facility] 5/26 with 1 week of worsening perirectal pain and swelling.  She saw Dr. Juanita Craver as an outpatient.  An MRI was obtained on 5/25.  This showed a large horseshoe-shaped abscess with possible fistula.  She presented to the ED 5/26 for evaluation, general surgery called to drain the abscess. Patient was admitted and underwent procedure listed above. Intraoperatively unable to identify the site of the fistula. Tolerated procedure well and was transferred to the floor.  Diet was advanced as tolerated.  On POD1 the patient was voiding well, tolerating diet, ambulating well, pain well controlled, vital signs stable and felt stable for discharge home.  Culture pending at time of discharged. She was sent home with 7 days of augmentin. Patient will follow up as below and knows to call with questions or concerns.    I have personally reviewed the patients medication history on the [place] controlled substance database.    Physical Exam: General:  Alert, NAD, pleasant, comfortable Pulm: rate and effort normal Abd:  Soft, ND, NT GU: perirectal abscess s/p I&D with penrose drain in place, small amount of purulent drainage on dressing, no erythema or fluctuance  Allergies as of 02/28/2020   No Known Allergies     Medication List    STOP taking these medications   HYDROcodone-homatropine 5-1.5 MG/5ML syrup Commonly known as: Hydromet     TAKE these medications   acetaminophen 500 MG tablet Commonly known as:  TYLENOL Take 2 tablets (1,000 mg total) by mouth every 8 (eight) hours as needed.   alendronate 70 MG tablet Commonly known as: FOSAMAX TAKE 1 TABLET BY MOUTH ONE TIME PER WEEK What changed: See the new instructions.   ALPRAZolam 0.5 MG tablet Commonly known as: XANAX Take 0.5 mg by mouth 3 (three) times daily as needed for anxiety or sleep.   amoxicillin-clavulanate 875-125 MG tablet Commonly known as: Augmentin Take 1 tablet by mouth 2 (two) times daily for 7 days.   DULoxetine 30 MG capsule Commonly known as: Cymbalta Take 1 capsule (30 mg total) by mouth daily.   esomeprazole 40 MG capsule Commonly known as: NEXIUM Take 40 mg by mouth daily before breakfast.   Fish Oil 1000 MG Caps Take 1,000 mg by mouth daily.   fluticasone 50 MCG/ACT nasal spray Commonly known as: Flonase Place 1 spray into both nostrils daily. What changed: Another medication with the same name was removed. Continue taking this medication, and follow the directions you see here.   ibuprofen 400 MG tablet Commonly known as: ADVIL Take 1 tablet (400 mg total) by mouth every 6 (six) hours as needed for mild pain.   oxyCODONE 5 MG immediate release tablet Commonly known as: Oxy IR/ROXICODONE Take 1 tablet (5 mg total) by mouth every 6 (six) hours as needed for severe pain (pain not relieved by tylenol or ibuprofen).   polyethylene glycol 17 g packet Commonly known as: MIRALAX / GLYCOLAX Take 17 g by mouth daily as needed for mild constipation.   ProAir HFA 108 (90 Base) MCG/ACT inhaler Generic drug: albuterol Inhale 2 puffs  into the lungs every 4 (four) hours as needed for wheezing or shortness of breath.   traZODone 50 MG tablet Commonly known as: DESYREL Take 50 mg by mouth at bedtime.   Vitamin D (Ergocalciferol) 1.25 MG (50000 UNIT) Caps capsule Commonly known as: DRISDOL Take 50,000 Units by mouth See admin instructions. Pt takes twice weekly. Tuesday and Friday        Follow-up  Information    Glendale Chard, MD Follow up.   Specialty: Internal Medicine Why: call and follow up for medical issues  Contact information: [address] [place] [address] [id number]        Donnie Mesa, MD. Go on 03/17/2020.   Specialty: General Surgery Why: Your appointment is 6/15 3:10pm Please arrive 30 minutes prior to your appointment to check in and fill out paperwork. Bring photo ID and insurance information. Contact information: [address] [place] [address] [phone]           Signed: Wellington Hampshire, [facility] 02/28/2020, 9:25 AM Please see Amion for pager number during day hours 7:00am-4:30pm

## 2020-03-03 ENCOUNTER — Telehealth: Payer: Self-pay

## 2020-03-03 DIAGNOSIS — Z23 Encounter for immunization: Secondary | ICD-10-CM | POA: Diagnosis not present

## 2020-03-03 NOTE — Telephone Encounter (Signed)
Transition Care Management Follow-up Telephone Call  Date of discharge and from where: 02/28/2020 / [facility]  How have you been since you were released from the hospital? The patient said she feels ok.  Any questions or concerns? No  Items Reviewed:  Did the pt receive and understand the discharge instructions provided? Yes  Medications obtained and verified? Yes  Any new allergies since your discharge? No  Dietary orders reviewed? No  Do you have support at home? No but her friends have been checking on her every day.  Other (ie: DME, Home Health, etc) No  Functional Questionnaire: (I = Independent and D = Dependent) ADL's: I  Bathing/Dressing- I   Meal Prep- I  Eating- I  Maintaining continence- I  Transferring/Ambulation- I  Managing Meds- I   Follow up appointments reviewed:    PCP Hospital f/u appt confirmed? Yes  Scheduled to see Dr. Baird Cancer 03/11/2020.  [facility] f/u appt confirmed? Yes the pt see's Dr. Cornelia Copa on 03/18/2020  Are transportation arrangements needed? No  If their condition worsens, is the pt aware to call  their PCP or go to the ED? Yes  Was the patient provided with contact information for the PCP's office or ED? Yes  Was the pt encouraged to call back with questions or concerns? Yes

## 2020-03-03 NOTE — Telephone Encounter (Signed)
The pt said that she needed to call the office back because she is picking up medications from the pharmacy.  I was calling to do a transition of care call and to schedule the pt an appt for a hospital f/u.

## 2020-03-05 DIAGNOSIS — M65341 Trigger finger, right ring finger: Secondary | ICD-10-CM | POA: Diagnosis not present

## 2020-03-07 LAB — AEROBIC/ANAEROBIC CULTURE W GRAM STAIN (SURGICAL/DEEP WOUND)

## 2020-03-11 ENCOUNTER — Ambulatory Visit: Payer: Medicare Other

## 2020-03-11 ENCOUNTER — Ambulatory Visit
Admission: RE | Admit: 2020-03-11 | Discharge: 2020-03-11 | Disposition: A | Discharge status: Home / Self Care | Payer: Medicare Other | Source: Ambulatory Visit | Attending: Internal Medicine | Admitting: Internal Medicine

## 2020-03-11 ENCOUNTER — Other Ambulatory Visit: Payer: Self-pay

## 2020-03-11 VITALS — BP 126/84 | HR 88 | Temp 98.3°F | Ht 61.8 in | Wt 111.8 lb

## 2020-03-11 DIAGNOSIS — M48061 Spinal stenosis, lumbar region without neurogenic claudication: Secondary | ICD-10-CM | POA: Diagnosis not present

## 2020-03-11 DIAGNOSIS — M5416 Radiculopathy, lumbar region: Secondary | ICD-10-CM

## 2020-03-11 DIAGNOSIS — R030 Elevated blood-pressure reading, without diagnosis of hypertension: Secondary | ICD-10-CM

## 2020-03-11 NOTE — Progress Notes (Signed)
Patient presents today for a blood pressure check. I got her reading at 126/84 with a pulse of 88.

## 2020-03-16 ENCOUNTER — Telehealth: Payer: Self-pay

## 2020-03-16 NOTE — Telephone Encounter (Signed)
Left vm for pt to return call for results

## 2020-03-16 NOTE — Telephone Encounter (Signed)
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 There is moderate bilateral foraminal stenosis.   Are you interested in seeing a specialist for further evaluation? How are you feeling?  RS

## 2020-04-16 DIAGNOSIS — F411 Generalized anxiety disorder: Secondary | ICD-10-CM | POA: Diagnosis not present

## 2020-04-16 DIAGNOSIS — F331 Major depressive disorder, recurrent, moderate: Secondary | ICD-10-CM | POA: Diagnosis not present

## 2020-05-13 DIAGNOSIS — F411 Generalized anxiety disorder: Secondary | ICD-10-CM | POA: Diagnosis not present

## 2020-05-13 DIAGNOSIS — F331 Major depressive disorder, recurrent, moderate: Secondary | ICD-10-CM | POA: Diagnosis not present

## 2020-06-12 DIAGNOSIS — F411 Generalized anxiety disorder: Secondary | ICD-10-CM | POA: Diagnosis not present

## 2020-06-12 DIAGNOSIS — F331 Major depressive disorder, recurrent, moderate: Secondary | ICD-10-CM | POA: Diagnosis not present

## 2020-07-09 DIAGNOSIS — F411 Generalized anxiety disorder: Secondary | ICD-10-CM | POA: Diagnosis not present

## 2020-07-09 DIAGNOSIS — F331 Major depressive disorder, recurrent, moderate: Secondary | ICD-10-CM | POA: Diagnosis not present

## 2020-07-14 DIAGNOSIS — M65341 Trigger finger, right ring finger: Secondary | ICD-10-CM | POA: Diagnosis not present

## 2020-07-23 IMAGING — MG DIGITAL SCREENING BILATERAL MAMMOGRAM WITH TOMO AND CAD
8 series · 8 of 24 positions shown · non-contrast
Comparison: Previous exam(s).

CLINICAL DATA: Screening.

EXAM:
DIGITAL SCREENING BILATERAL MAMMOGRAM WITH TOMO AND CAD

[L MLO synth-2D]
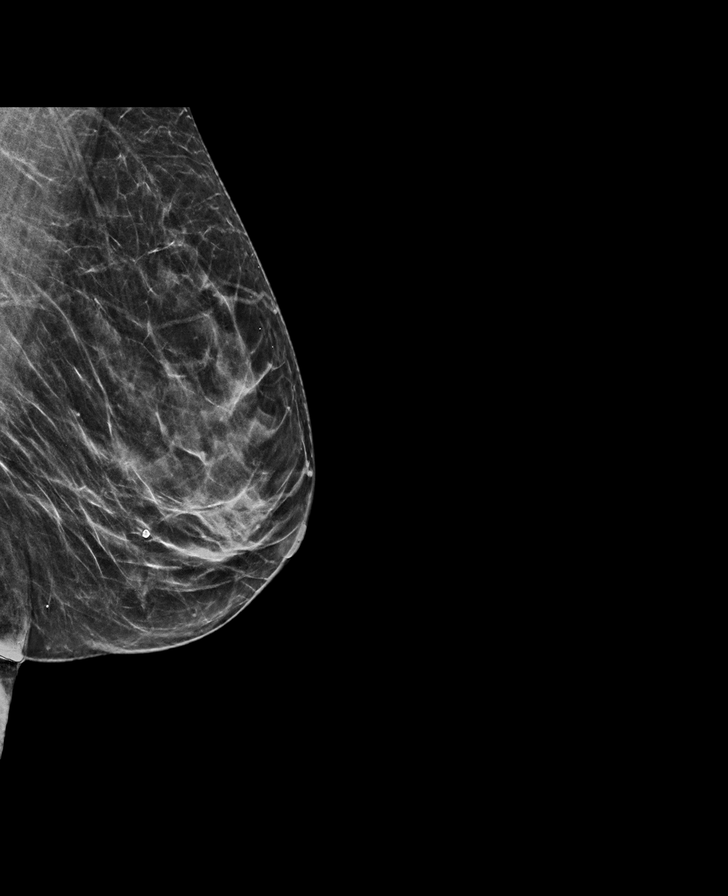

[R MLO synth-2D]
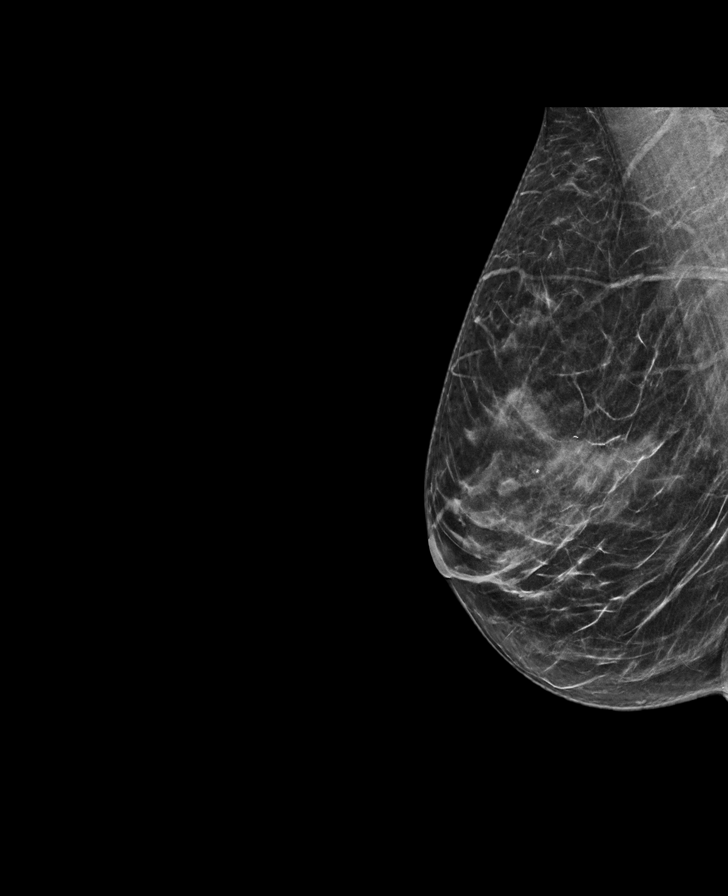

[R CC synth-2D]
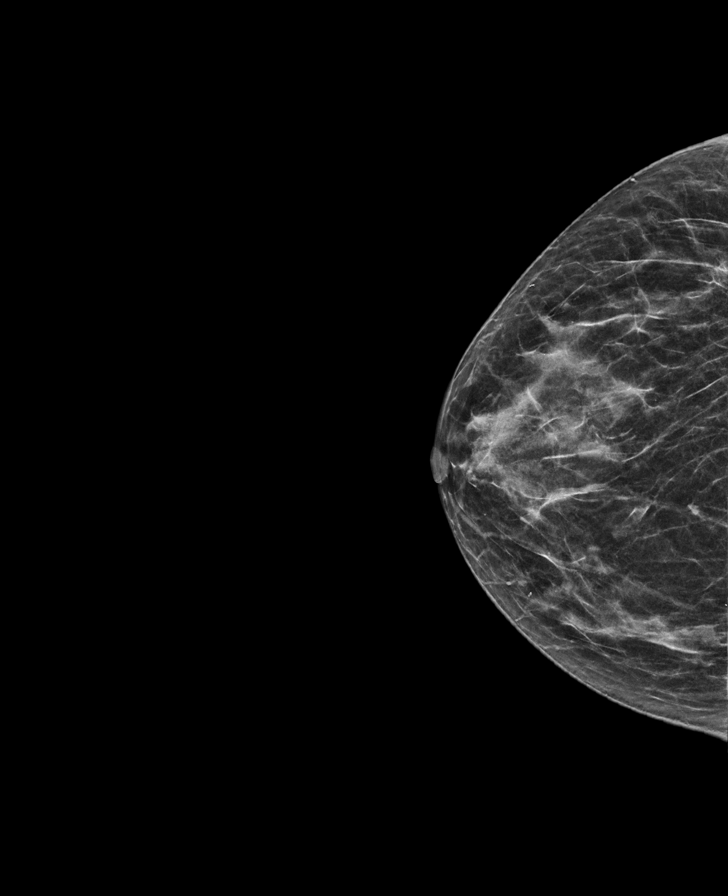

[L CC synth-2D]
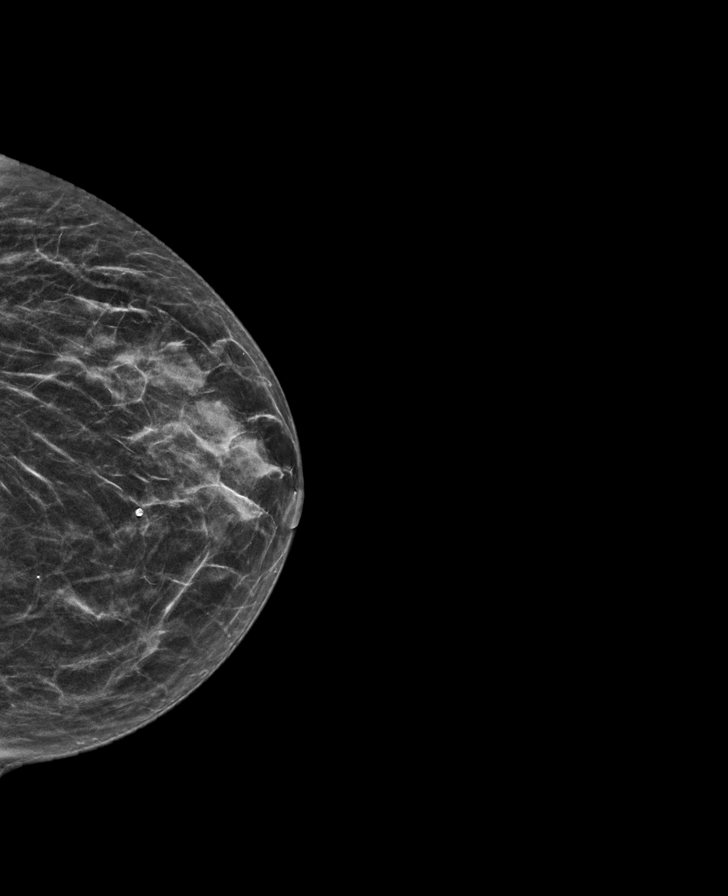

[R MLO tomo · tomo slice 28/55.0]
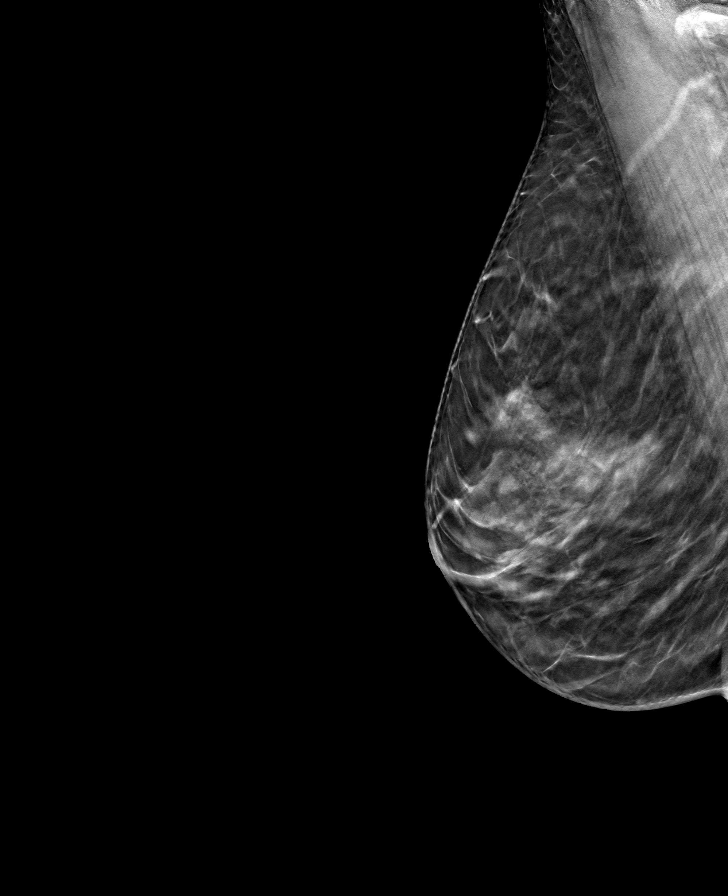

[L MLO tomo · tomo slice 28/55.0]
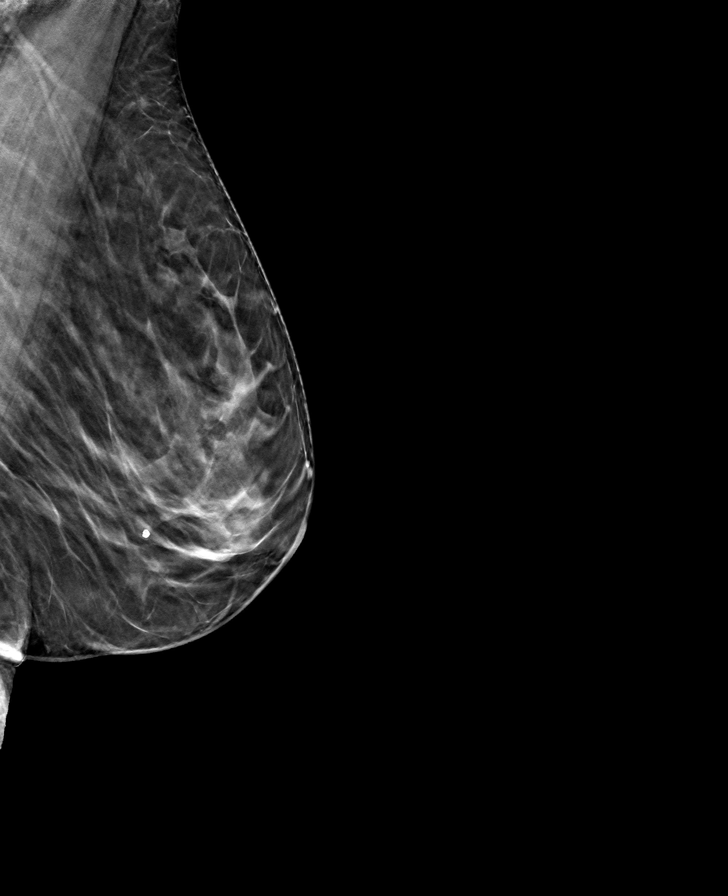

[R CC tomo · tomo slice 27/52.0]
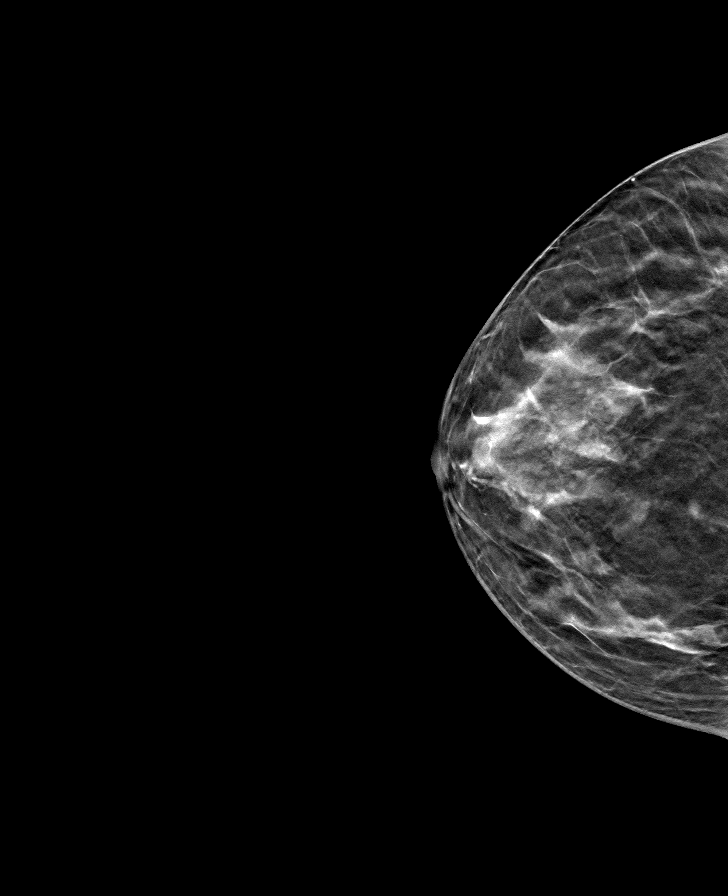

[L CC tomo · tomo slice 26/51.0]
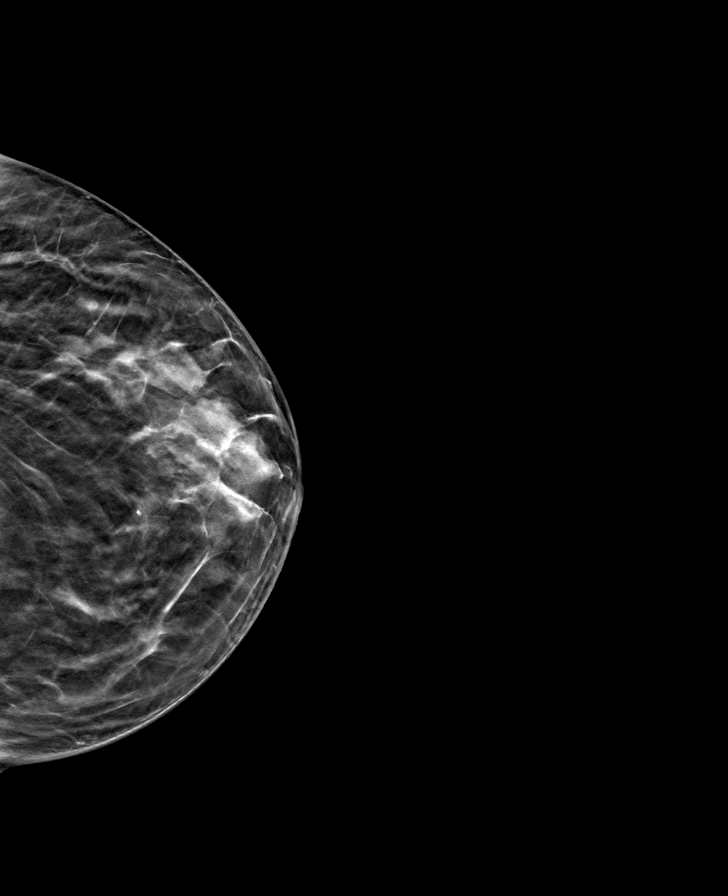

[8 of 24 positions shown; findings below may reference images not displayed]

ACR Breast Density Category c: The breast tissue is heterogeneously
dense, which may obscure small masses.
FINDINGS: There are no findings suspicious for malignancy. Images were
processed with CAD.
IMPRESSION: No mammographic evidence of malignancy. A result letter of this
screening mammogram will be mailed directly to the patient.

RECOMMENDATION:
Screening mammogram in one year. (Code:[id number]-[id number])

BI-RADS CATEGORY  1: Negative.

## 2020-07-27 ENCOUNTER — Ambulatory Visit (INDEPENDENT_AMBULATORY_CARE_PROVIDER_SITE_OTHER): Payer: Medicare Other | Admitting: Internal Medicine

## 2020-07-27 ENCOUNTER — Other Ambulatory Visit: Payer: Self-pay

## 2020-07-27 ENCOUNTER — Encounter: Payer: Self-pay | Admitting: Internal Medicine

## 2020-07-27 VITALS — BP 126/94 | HR 80 | Temp 99.9°F | Ht 61.8 in | Wt 131.0 lb

## 2020-07-27 DIAGNOSIS — H9201 Otalgia, right ear: Secondary | ICD-10-CM | POA: Diagnosis not present

## 2020-07-27 DIAGNOSIS — R638 Other symptoms and signs concerning food and fluid intake: Secondary | ICD-10-CM

## 2020-07-27 DIAGNOSIS — F331 Major depressive disorder, recurrent, moderate: Secondary | ICD-10-CM

## 2020-07-27 DIAGNOSIS — R030 Elevated blood-pressure reading, without diagnosis of hypertension: Secondary | ICD-10-CM

## 2020-07-27 DIAGNOSIS — R509 Fever, unspecified: Secondary | ICD-10-CM | POA: Diagnosis not present

## 2020-07-27 LAB — POC COVID19 BINAXNOW: SARS Coronavirus 2 Ag: NEGATIVE

## 2020-07-27 MED ORDER — CIPROFLOXACIN-DEXAMETHASONE 0.3-0.1 % OT SUSP
4.0000 [drp] | Freq: Two times a day (BID) | OTIC | 0 refills | Status: DC
Start: 2020-07-27 — End: 2021-02-24

## 2020-07-27 NOTE — Patient Instructions (Signed)

## 2020-07-27 NOTE — Progress Notes (Signed)
I,Katawbba Wiggins,acting as a Education administrator for [name], MD.,have documented all relevant documentation on the behalf of [name], MD,as directed by  [name], MD while in the presence of [name], MD.  This visit occurred during the SARS-CoV-2 public health emergency.  Safety protocols were in place, including screening questions prior to the visit, additional usage of staff PPE, and extensive cleaning of exam room while observing appropriate contact time as indicated for disinfecting solutions.  Subjective:     Patient ID: Maureen Rodgers , female    DOB: [birth year] , 69 y.o.   MRN: [id number]   Chief Complaint  Patient presents with  . Otalgia    right    HPI  The patient is here today for evaluation of right ear discomfort. She states her sx started about two weeks ago. There is no associated fever. However, she does have some sinus congestion. She does not think she has COVID, she is rarely around other people. She thinks this likely contributes to her depression as well. She is still in therapy at [facility].     Past Medical History:  Diagnosis Date  . Depression   . GERD (gastroesophageal reflux disease)   . Tobacco use      Family History  Problem Relation Age of Onset  . Heart disease Mother   . Dementia Mother   . Alcohol abuse Mother   . Alcohol abuse Father   . Diabetes Brother      Current Outpatient Medications:  .  alendronate (FOSAMAX) 70 MG tablet, TAKE 1 TABLET BY MOUTH ONE TIME PER WEEK, Disp: 12 tablet, Rfl: 3 .  ALPRAZolam (XANAX) 0.5 MG tablet, Take 0.5 mg by mouth 3 (three) times daily as needed for anxiety or sleep. , Disp: , Rfl:  .  DULoxetine (CYMBALTA) 30 MG capsule, Take 1 capsule (30 mg total) by mouth daily., Disp: 90 capsule, Rfl: 1 .  esomeprazole (NEXIUM) 40 MG capsule, Take 40 mg by mouth daily before breakfast., Disp: , Rfl:  .  fluticasone (FLONASE) 50 MCG/ACT nasal spray, Place 1 spray into both nostrils daily., Disp:  16 g, Rfl: 5 .  Omega-3 Fatty Acids (FISH OIL) 1000 MG CAPS, Take 1,000 mg by mouth daily. , Disp: , Rfl:  .  traZODone (DESYREL) 50 MG tablet, Take 50 mg by mouth at bedtime. , Disp: , Rfl:  .  Vitamin D, Ergocalciferol, (DRISDOL) 50000 UNITS CAPS capsule, Take 50,000 Units by mouth See admin instructions. Pt takes twice weekly. Tuesday and Friday , Disp: , Rfl:  .  ciprofloxacin (CIPRO) 500 MG tablet, Take 500 mg by mouth 2 (two) times daily. (Patient not taking: Reported on 07/27/2020), Disp: , Rfl:  .  ciprofloxacin-dexamethasone (CIPRODEX) OTIC suspension, Place 4 drops into the right ear 2 (two) times daily., Disp: 7.5 mL, Rfl: 0 .  ibuprofen (ADVIL) 400 MG tablet, Take 1 tablet (400 mg total) by mouth every 6 (six) hours as needed for mild pain. (Patient not taking: Reported on 07/27/2020), Disp: 30 tablet, Rfl: 0 .  metroNIDAZOLE (FLAGYL) 500 MG tablet, Take 500 mg by mouth 3 (three) times daily. (Patient not taking: Reported on 07/27/2020), Disp: , Rfl:  .  ondansetron (ZOFRAN) 4 MG tablet, , Disp: , Rfl:  .  oxyCODONE (OXY IR/ROXICODONE) 5 MG immediate release tablet, Take 1 tablet (5 mg total) by mouth every 6 (six) hours as needed for severe pain (pain not relieved by tylenol or ibuprofen). (Patient not taking:  Reported on 03/03/2020), Disp: 15 tablet, Rfl: 0 .  polyethylene glycol (MIRALAX / GLYCOLAX) 17 g packet, Take 17 g by mouth daily as needed for mild constipation. (Patient not taking: Reported on 07/27/2020), Disp: 14 each, Rfl: 0 .  PROAIR HFA 108 (90 Base) MCG/ACT inhaler, Inhale 2 puffs into the lungs every 4 (four) hours as needed for wheezing or shortness of breath.  (Patient not taking: Reported on 07/27/2020), Disp: , Rfl: 0   No Known Allergies   Review of Systems  Constitutional: Negative.   HENT: Positive for ear pain (right ear). Facial swelling: right ear area.   Respiratory: Negative.   Cardiovascular: Negative.   Gastrointestinal: Negative.    Psychiatric/Behavioral: Negative.   All other systems reviewed and are negative.    Today's Vitals   07/27/20 1420  BP: (!) 126/94  Pulse: 80  Temp: 99.9 F (37.7 C)  TempSrc: Oral  Weight: 131 lb (59.4 kg)  Height: 5' 1.8" (1.57 m)  PainSc: 4   PainLoc: Back   Body mass index is 24.12 kg/m.  Wt Readings from Last 3 Encounters:  07/27/20 131 lb (59.4 kg)  03/11/20 111 lb 12.8 oz (50.7 kg)  02/27/20 118 lb (53.5 kg)   Objective:  Physical Exam Vitals and nursing note reviewed.  Constitutional:      Appearance: Normal appearance. She is obese.  HENT:     Head: Normocephalic and atraumatic.     Right Ear: Tenderness present. There is no impacted cerumen. Tympanic membrane is not perforated, erythematous or bulging.     Left Ear: Tympanic membrane and ear canal normal. No tenderness. There is no impacted cerumen. Tympanic membrane is not perforated, erythematous or bulging.  Cardiovascular:     Rate and Rhythm: Normal rate and regular rhythm.     Heart sounds: Normal heart sounds.  Pulmonary:     Breath sounds: Normal breath sounds.  Skin:    General: Skin is warm.  Neurological:     General: No focal deficit present.     Mental Status: She is alert and oriented to person, place, and time.         Assessment And Plan:     1. Otalgia, right Comments: I will send rx Ciprodex gtt to right ear twice daily. She verbally acknowledges treatment plan. Advised to contact me in 24-72 hrs to give update.  - CBC with Diff  2. Febrile illness Comments: Rapid flu A and B/COVID tests were negative. - POC COVID-19 - Novel Coronavirus, NAA ([facility])  3. Elevated blood pressure reading Comments: I will conitnue to follow this. Encouraged to avoid adding salt to her foods.  - BMP8+EGFR  4. Moderate episode of recurrent major depressive disorder (HCC) Comments: Unfortuntely, her sx have worsened after close friend's death. She will continue with current meds as per Dr.  Marquis Buggy team.   5. Salt craving Comments: PT advised this could indicate need for calcium supplementation. I will also check BMP.  - BMP8+EGFR     Patient was given opportunity to ask questions. Patient verbalized understanding of the plan and was able to repeat key elements of the plan. All questions were answered to their satisfaction.  [name], MD   I, [name], MD, have reviewed all documentation for this visit. The documentation on 07/28/20 for the exam, diagnosis, procedures, and orders are all accurate and complete.  THE PATIENT IS ENCOURAGED TO PRACTICE SOCIAL DISTANCING DUE TO THE COVID-19 PANDEMIC.

## 2020-07-28 LAB — BMP8+EGFR
BUN/Creatinine Ratio: 10 — ABNORMAL LOW (ref 12–28)
BUN: 8 mg/dL (ref 8–27)
CO2: 22 mmol/L (ref 20–29)
Calcium: 9.2 mg/dL (ref 8.7–10.3)
Chloride: 104 mmol/L (ref 96–106)
Creatinine, Ser: 0.78 mg/dL (ref 0.57–1.00)
GFR calc Af Amer: 90 mL/min/{1.73_m2} (ref >59)
GFR calc non Af Amer: 78 mL/min/{1.73_m2} (ref >59)
Glucose: 85 mg/dL (ref 65–99)
Potassium: 4.4 mmol/L (ref 3.5–5.2)
Sodium: 140 mmol/L (ref 134–144)

## 2020-07-28 LAB — CBC WITH DIFFERENTIAL/PLATELET
Basophils Absolute: 0.1 10*3/uL (ref 0.0–0.2)
Basos: 1 %
EOS (ABSOLUTE): 0.1 10*3/uL (ref 0.0–0.4)
Eos: 1 %
Hematocrit: 34.3 % (ref 34.0–46.6)
Hemoglobin: 10.3 g/dL — ABNORMAL LOW (ref 11.1–15.9)
Immature Grans (Abs): 0 10*3/uL (ref 0.0–0.1)
Immature Granulocytes: 0 %
Lymphocytes Absolute: 2.8 10*3/uL (ref 0.7–3.1)
Lymphs: 41 %
MCH: 23.7 pg — ABNORMAL LOW (ref 26.6–33.0)
MCHC: 30 g/dL — ABNORMAL LOW (ref 31.5–35.7)
MCV: 79 fL (ref 79–97)
Monocytes Absolute: 0.5 10*3/uL (ref 0.1–0.9)
Monocytes: 7 %
Neutrophils Absolute: 3.6 10*3/uL (ref 1.4–7.0)
Neutrophils: 50 %
Platelets: 295 10*3/uL (ref 150–450)
RBC: 4.35 x10E6/uL (ref 3.77–5.28)
RDW: 16 % — ABNORMAL HIGH (ref 11.7–15.4)
WBC: 7 10*3/uL (ref 3.4–10.8)

## 2020-07-28 LAB — NOVEL CORONAVIRUS, NAA: SARS-CoV-2, NAA: NOT DETECTED

## 2020-07-28 LAB — SARS-COV-2, NAA 2 DAY TAT

## 2020-07-30 ENCOUNTER — Telehealth: Payer: Self-pay

## 2020-07-30 ENCOUNTER — Other Ambulatory Visit: Payer: Self-pay | Admitting: Internal Medicine

## 2020-07-30 MED ORDER — AMOXICILLIN-POT CLAVULANATE 875-125 MG PO TABS: 1.0000 | ORAL_TABLET | Freq: Two times a day (BID) | ORAL | 0 refills | Status: AC

## 2020-07-30 NOTE — Telephone Encounter (Signed)
Pt notified of labs results, she stated she is feeling ok however the ciprofloxacin and the norel AD has not been working. She is following your instructions and is almost done with the ear drops

## 2020-07-30 NOTE — Telephone Encounter (Signed)
-----   Message from Glendale Chard, MD sent at 07/28/2020  1:48 PM EDT ----- Here are your lab results:  COVID test is negative. Your kidney function is normal. Your blood count has improved.   How are you feeling today?   Please let me know if you have any questions or concerns. Stay safe!   Sincerely,    Robyn N. Baird Cancer, MD

## 2020-07-30 NOTE — Telephone Encounter (Signed)
Left the patient a message to call back for lab results. 

## 2020-07-30 NOTE — Telephone Encounter (Signed)
Please let her know I have sent in antibiotic for her to take. Call us Monday to let us know how she is doing.

## 2020-07-31 ENCOUNTER — Telehealth: Payer: Self-pay

## 2020-07-31 NOTE — Telephone Encounter (Signed)
The pt was notified that Dr. Baird Cancer sent an antibiotic to the pharmacy for the pt and for the pt to call the office on Monday to let Dr. Baird Cancer know how she is doing.

## 2020-08-11 ENCOUNTER — Ambulatory Visit (INDEPENDENT_AMBULATORY_CARE_PROVIDER_SITE_OTHER): Payer: Medicare Other

## 2020-08-11 ENCOUNTER — Other Ambulatory Visit: Payer: Self-pay

## 2020-08-11 VITALS — BP 128/70 | HR 93 | Temp 98.1°F | Ht 61.8 in | Wt 132.0 lb

## 2020-08-11 DIAGNOSIS — Z23 Encounter for immunization: Secondary | ICD-10-CM | POA: Diagnosis not present

## 2020-08-11 DIAGNOSIS — F331 Major depressive disorder, recurrent, moderate: Secondary | ICD-10-CM | POA: Diagnosis not present

## 2020-08-11 DIAGNOSIS — F411 Generalized anxiety disorder: Secondary | ICD-10-CM | POA: Diagnosis not present

## 2020-08-11 NOTE — Progress Notes (Signed)
Patient is here for flu shot ° °

## 2020-08-26 ENCOUNTER — Encounter: Payer: Self-pay | Admitting: Nurse Practitioner

## 2020-08-26 ENCOUNTER — Ambulatory Visit (INDEPENDENT_AMBULATORY_CARE_PROVIDER_SITE_OTHER): Payer: Medicare Other | Admitting: Nurse Practitioner

## 2020-08-26 ENCOUNTER — Other Ambulatory Visit: Payer: Self-pay

## 2020-08-26 VITALS — BP 128/76 | HR 87 | Temp 98.3°F | Ht 63.0 in | Wt 134.6 lb

## 2020-08-26 DIAGNOSIS — H9201 Otalgia, right ear: Secondary | ICD-10-CM | POA: Diagnosis not present

## 2020-08-26 NOTE — Patient Instructions (Signed)

## 2020-08-26 NOTE — Progress Notes (Signed)
I,Tianna Badgett,acting as a Education administrator for [employer], NP.,have documented all relevant documentation on the behalf of [employer], NP,as directed by  Bary Castilla, NP while in the presence of Bary Castilla, NP.  This visit occurred during the SARS-CoV-2 public health emergency.  Safety protocols were in place, including screening questions prior to the visit, additional usage of staff PPE, and extensive cleaning of exam room while observing appropriate contact time as indicated for disinfecting solutions.  Subjective:     Patient ID: Maureen Rodgers , female    DOB: 01-26-1951 , 69 y.o.   MRN: [id number]   Chief Complaint  Patient presents with  . Otalgia    HPI  The patient is here today for evaluation of right ear discomfort. She states her sx started about a month ago. There is no associated fever. She does not think she has COVID, she is rarely around other people. She thinks this likely contributes to her depression as well. She is still in therapy at [facility].  She was here couple of weeks ago and was given prescription for amoxacillin and ear drops. She completed the medication. She is still having some discomfort. Fullness, Itching in the are. The pain is occasional. She is getting some dental work at this time. She is not sure if this started prior to her dental work or not.   Otalgia  There is pain in the right ear. This is a new problem. The current episode started more than 1 month ago. The problem occurs constantly. The problem has been gradually improving. There has been no fever. The pain is at a severity of 4/10. The pain is mild. Pertinent negatives include no coughing or hearing loss. She has tried antibiotics and ear drops for the symptoms. The treatment provided mild relief.     Past Medical History:  Diagnosis Date  . Depression   . GERD (gastroesophageal reflux disease)   . Tobacco use      Family History  Problem Relation Age of Onset  . Heart  disease Mother   . Dementia Mother   . Alcohol abuse Mother   . Alcohol abuse Father   . Diabetes Brother      Current Outpatient Medications:  .  alendronate (FOSAMAX) 70 MG tablet, TAKE 1 TABLET BY MOUTH ONE TIME PER WEEK, Disp: 12 tablet, Rfl: 3 .  ALPRAZolam (XANAX) 0.5 MG tablet, Take 0.5 mg by mouth 3 (three) times daily as needed for anxiety or sleep. , Disp: , Rfl:  .  ciprofloxacin-dexamethasone (CIPRODEX) OTIC suspension, Place 4 drops into the right ear 2 (two) times daily., Disp: 7.5 mL, Rfl: 0 .  DULoxetine (CYMBALTA) 30 MG capsule, Take 1 capsule (30 mg total) by mouth daily., Disp: 90 capsule, Rfl: 1 .  esomeprazole (NEXIUM) 40 MG capsule, Take 40 mg by mouth daily before breakfast., Disp: , Rfl:  .  fluticasone (FLONASE) 50 MCG/ACT nasal spray, Place 1 spray into both nostrils daily., Disp: 16 g, Rfl: 5 .  ibuprofen (ADVIL) 400 MG tablet, Take 1 tablet (400 mg total) by mouth every 6 (six) hours as needed for mild pain. (Patient not taking: Reported on 07/27/2020), Disp: 30 tablet, Rfl: 0 .  metroNIDAZOLE (FLAGYL) 500 MG tablet, Take 500 mg by mouth 3 (three) times daily. (Patient not taking: Reported on 07/27/2020), Disp: , Rfl:  .  Omega-3 Fatty Acids (FISH OIL) 1000 MG CAPS, Take 1,000 mg by mouth daily. , Disp: , Rfl:  .  ondansetron (ZOFRAN) 4  MG tablet, , Disp: , Rfl:  .  oxyCODONE (OXY IR/ROXICODONE) 5 MG immediate release tablet, Take 1 tablet (5 mg total) by mouth every 6 (six) hours as needed for severe pain (pain not relieved by tylenol or ibuprofen). (Patient not taking: Reported on 03/03/2020), Disp: 15 tablet, Rfl: 0 .  polyethylene glycol (MIRALAX / GLYCOLAX) 17 g packet, Take 17 g by mouth daily as needed for mild constipation. (Patient not taking: Reported on 07/27/2020), Disp: 14 each, Rfl: 0 .  PROAIR HFA 108 (90 Base) MCG/ACT inhaler, Inhale 2 puffs into the lungs every 4 (four) hours as needed for wheezing or shortness of breath.  (Patient not taking: Reported  on 07/27/2020), Disp: , Rfl: 0 .  traZODone (DESYREL) 50 MG tablet, Take 50 mg by mouth at bedtime. , Disp: , Rfl:  .  Vitamin D, Ergocalciferol, (DRISDOL) 50000 UNITS CAPS capsule, Take 50,000 Units by mouth See admin instructions. Pt takes twice weekly. Tuesday and Friday , Disp: , Rfl:    No Known Allergies   Review of Systems  HENT: Positive for ear pain. Negative for congestion, hearing loss, sinus pressure and sinus pain.   Respiratory: Negative.  Negative for cough and shortness of breath.   Cardiovascular: Negative.      Today's Vitals   08/26/20 0901  BP: 128/76  Pulse: 87  Temp: 98.3 F (36.8 C)  TempSrc: Oral  Weight: 134 lb 9.6 oz (61.1 kg)  Height: 5\' 3"  (1.6 m)   Body mass index is 23.84 kg/m.   Objective:  Physical Exam Constitutional:      Appearance: Normal appearance.  HENT:     Right Ear: Hearing normal. Tenderness present. No drainage. There is no impacted cerumen. There is mastoid tenderness.     Left Ear: Hearing and tympanic membrane normal. No drainage. There is no impacted cerumen. No mastoid tenderness.  Cardiovascular:     Rate and Rhythm: Normal rate and regular rhythm.     Pulses: Normal pulses.     Heart sounds: Normal heart sounds.  Pulmonary:     Effort: Pulmonary effort is normal.     Breath sounds: Normal breath sounds.  Neurological:     Mental Status: She is alert.         Assessment And Plan:     1. Otalgia, right  - The patient has completed a round of antibiotics and ear drops.  - The patient continues to report fullness, dull pain behind and infront of her ears therefore a referral was made for ENT to rule out any mastoid infection or any other complications.  -The patient was given instruction to call the office if she had any further questions.   Patient was given opportunity to ask questions. Patient verbalized understanding of the plan and was able to repeat key elements of the plan. All questions were answered to their  satisfaction.  Octavio Manns   I, Octavio Manns, have reviewed all documentation for this visit. The documentation on 08/26/20 for the exam, diagnosis, procedures, and orders are all accurate and complete.  THE PATIENT IS ENCOURAGED TO PRACTICE SOCIAL DISTANCING DUE TO THE COVID-19 PANDEMIC.

## 2020-09-02 DIAGNOSIS — F331 Major depressive disorder, recurrent, moderate: Secondary | ICD-10-CM | POA: Diagnosis not present

## 2020-09-02 DIAGNOSIS — F411 Generalized anxiety disorder: Secondary | ICD-10-CM | POA: Diagnosis not present

## 2020-09-07 ENCOUNTER — Ambulatory Visit (INDEPENDENT_AMBULATORY_CARE_PROVIDER_SITE_OTHER): Payer: Medicare Other | Admitting: Otolaryngology

## 2020-09-11 ENCOUNTER — Ambulatory Visit (INDEPENDENT_AMBULATORY_CARE_PROVIDER_SITE_OTHER): Payer: Medicare Other

## 2020-09-11 DIAGNOSIS — Z23 Encounter for immunization: Secondary | ICD-10-CM | POA: Diagnosis not present

## 2020-09-11 NOTE — Progress Notes (Signed)
   Covid-19 Vaccination Clinic  Name:  Maureen Rodgers    MRN: [id number] DOB: Oct 01, 1951  09/11/2020  [name] was observed post Covid-19 immunization for 15 minutes without incident. She was provided with Vaccine Information Sheet and instruction to access the V-Safe system.   [name] was instructed to call 911 with any severe reactions post vaccine: Marland Kitchen Difficulty breathing  . Swelling of face and throat  . A fast heartbeat  . A bad rash all over body  . Dizziness and weakness   Immunizations Administered    No immunizations on file.

## 2020-09-14 ENCOUNTER — Other Ambulatory Visit: Payer: Self-pay

## 2020-09-14 ENCOUNTER — Ambulatory Visit (INDEPENDENT_AMBULATORY_CARE_PROVIDER_SITE_OTHER): Payer: Medicare Other | Admitting: Otolaryngology

## 2020-09-14 ENCOUNTER — Encounter (INDEPENDENT_AMBULATORY_CARE_PROVIDER_SITE_OTHER): Payer: Self-pay | Admitting: Otolaryngology

## 2020-09-14 VITALS — Temp 97.2°F

## 2020-09-14 DIAGNOSIS — M26609 Unspecified temporomandibular joint disorder, unspecified side: Secondary | ICD-10-CM

## 2020-09-14 DIAGNOSIS — H9201 Otalgia, right ear: Secondary | ICD-10-CM | POA: Diagnosis not present

## 2020-09-14 NOTE — Progress Notes (Signed)
HPI: Maureen Rodgers is a 69 y.o. female who presents is referred by Dr. Baird Cancer for evaluation of right ear complaints.  Patient has had intermittent right ear pain and swelling within the ear as well as in front of the right ear that has been going on for about 8 weeks now.  She has been treated for ear infection with amoxicillin as well as antibiotic eardrops.  She describes pain in front of the ear to goes down into the jaw.  She is scheduled to see her dentist tomorrow.  She has had no drainage from the ear.  Past Medical History:  Diagnosis Date  . Depression   . GERD (gastroesophageal reflux disease)   . Tobacco use    Past Surgical History:  Procedure Laterality Date  . ABDOMINAL HYSTERECTOMY    . BILATERAL CARPAL TUNNEL RELEASE  2018   2018 on Right, many years ago on left  . EYE SURGERY    . INCISION AND DRAINAGE PERIRECTAL ABSCESS N/A 02/27/2020   Procedure: IRRIGATION AND DEBRIDEMENT PERIRECTAL ABSCESS;  Surgeon: Donnie Mesa, MD;  Location: [facility];  Service: General;  Laterality: N/A;  . IRRIGATION AND DEBRIDEMENT ABSCESS  02/27/2020   Procedure(s) Performed: IRRIGATION AND DEBRIDEMENT PERIRECTAL ABSCESS (N/A Perineum)   Social History   Socioeconomic History  . Marital status: Single    Spouse name: Not on file  . Number of children: Not on file  . Years of education: Not on file  . Highest education level: Not on file  Occupational History  . Not on file  Tobacco Use  . Smoking status: Current Every Day Smoker    Packs/day: 0.50    Years: 44.00    Pack years: 22.00    Types: Cigarettes    Start date: 25  . Smokeless tobacco: Never Used  . Tobacco comment: has been smoking 1ppd x 18 months  Vaping Use  . Vaping Use: Never used  Substance and Sexual Activity  . Alcohol use: Yes    Alcohol/week: 3.0 standard drinks    Types: 3 Cans of beer per week    Comment: 3 beers a day  . Drug use: No  . Sexual activity: Not Currently  Other Topics Concern  . Not  on file  Social History Narrative  . Not on file   Social Determinants of Health   Financial Resource Strain: Low Risk   . Difficulty of Paying Living Expenses: Not hard at all  Food Insecurity: No Food Insecurity  . Worried About Charity fundraiser in the Last Year: Never true  . Ran Out of Food in the Last Year: Never true  Transportation Needs: No Transportation Needs  . Lack of Transportation (Medical): No  . Lack of Transportation (Non-Medical): No  Physical Activity: Inactive  . Days of Exercise per Week: 0 days  . Minutes of Exercise per Session: 0 min  Stress: No Stress Concern Present  . Feeling of Stress : Not at all  Social Connections: Not on file   Family History  Problem Relation Age of Onset  . Heart disease Mother   . Dementia Mother   . Alcohol abuse Mother   . Alcohol abuse Father   . Diabetes Brother    No Known Allergies Prior to Admission medications   Medication Sig Start Date End Date Taking? Authorizing Provider  alendronate (FOSAMAX) 70 MG tablet TAKE 1 TABLET BY MOUTH ONE TIME PER WEEK 11/04/19  Yes Glendale Chard, MD  ALPRAZolam Duanne Moron) 0.5  MG tablet Take 0.5 mg by mouth 3 (three) times daily as needed for anxiety or sleep.    Yes [provider]  ciprofloxacin-dexamethasone (CIPRODEX) OTIC suspension Place 4 drops into the right ear 2 (two) times daily. 07/27/20  Yes Glendale Chard, MD  esomeprazole (NEXIUM) 40 MG capsule Take 40 mg by mouth daily before breakfast.   Yes [provider]  fluticasone (FLONASE) 50 MCG/ACT nasal spray Place 1 spray into both nostrils daily. 09/07/18  Yes Minette Brine, FNP  ibuprofen (ADVIL) 400 MG tablet Take 1 tablet (400 mg total) by mouth every 6 (six) hours as needed for mild pain. 02/28/20  Yes Meuth, Brooke A, PA-C  metroNIDAZOLE (FLAGYL) 500 MG tablet Take 500 mg by mouth 3 (three) times daily. 02/28/20  Yes [provider]  Omega-3 Fatty Acids (FISH OIL) 1000 MG CAPS Take 1,000 mg by  mouth daily.    Yes [provider]  ondansetron (ZOFRAN) 4 MG tablet  02/28/20  Yes [provider]  oxyCODONE (OXY IR/ROXICODONE) 5 MG immediate release tablet Take 1 tablet (5 mg total) by mouth every 6 (six) hours as needed for severe pain (pain not relieved by tylenol or ibuprofen). 02/28/20  Yes Meuth, Brooke A, PA-C  polyethylene glycol (MIRALAX / GLYCOLAX) 17 g packet Take 17 g by mouth daily as needed for mild constipation. 02/28/20  Yes Meuth, Blaine Hamper, PA-C  PROAIR HFA 108 (90 Base) MCG/ACT inhaler Inhale 2 puffs into the lungs every 4 (four) hours as needed for wheezing or shortness of breath. 07/20/15  Yes [provider]  traZODone (DESYREL) 50 MG tablet Take 50 mg by mouth at bedtime.    Yes [provider]  Vitamin D, Ergocalciferol, (DRISDOL) 50000 UNITS CAPS capsule Take 50,000 Units by mouth See admin instructions. Pt takes twice weekly. Tuesday and Friday    Yes [provider]  DULoxetine (CYMBALTA) 30 MG capsule Take 1 capsule (30 mg total) by mouth daily. 08/14/19 08/13/20  Glendale Chard, MD     Positive ROS: Otherwise negative  All other systems have been reviewed and were otherwise negative with the exception of those mentioned in the HPI and as above.  Physical Exam: Constitutional: Alert, well-appearing, no acute distress Ears: External ears without lesions or tenderness. Ear canals are clear bilaterally.  There is no evidence of external otitis in the right ear canal.  As the TM is clear with good mobility on pneumatic otoscopy and no middle ear effusion noted.  She does have some dry white residue from the antibiotic eardrops on the TM but no signs of external otitis or otitis media on clinical exam today.  On hearing screening with the tuning forks AC was greater than BC bilaterally and she heard about the same in both ears. Nasal: External nose without lesions. Septum with mild deformity and mild rhinitis..  Both middle  meatus regions are clear with no signs of infection. Oral: Lips and gums without lesions. Tongue and palate mucosa without lesions. Posterior oropharynx clear. Neck: No palpable adenopathy or masses.  On palpation she has no discomfort on moving the pinna or ear canal.  She does have some mild discomfort on palpation of the right TMJ joint but no swelling and no clicking or popping. Respiratory: Breathing comfortably  Skin: No facial/neck lesions or rash noted.  Procedures  Assessment: I suspect the right otalgia is secondary to TMJ dysfunction.  Plan: Recommended use of NSAIDs in warm compresses which she has been using.  Also recommended that she discuss this with her dentist who she is scheduled to see later this week.   Radene Journey, MD   CC:

## 2020-10-06 DIAGNOSIS — M810 Age-related osteoporosis without current pathological fracture: Secondary | ICD-10-CM | POA: Diagnosis not present

## 2020-10-06 DIAGNOSIS — Z6829 Body mass index (BMI) 29.0-29.9, adult: Secondary | ICD-10-CM | POA: Diagnosis not present

## 2020-10-06 DIAGNOSIS — Z01411 Encounter for gynecological examination (general) (routine) with abnormal findings: Secondary | ICD-10-CM | POA: Diagnosis not present

## 2020-10-06 DIAGNOSIS — D1739 Benign lipomatous neoplasm of skin and subcutaneous tissue of other sites: Secondary | ICD-10-CM | POA: Diagnosis not present

## 2020-10-07 DIAGNOSIS — F411 Generalized anxiety disorder: Secondary | ICD-10-CM | POA: Diagnosis not present

## 2020-10-10 ENCOUNTER — Encounter: Payer: Self-pay | Admitting: Internal Medicine

## 2020-10-21 DIAGNOSIS — D171 Benign lipomatous neoplasm of skin and subcutaneous tissue of trunk: Secondary | ICD-10-CM | POA: Diagnosis not present

## 2020-11-06 DIAGNOSIS — F411 Generalized anxiety disorder: Secondary | ICD-10-CM | POA: Diagnosis not present

## 2020-11-15 ENCOUNTER — Other Ambulatory Visit: Payer: Self-pay | Admitting: Internal Medicine

## 2020-12-07 ENCOUNTER — Other Ambulatory Visit: Payer: Self-pay | Admitting: General Surgery

## 2020-12-07 DIAGNOSIS — D171 Benign lipomatous neoplasm of skin and subcutaneous tissue of trunk: Secondary | ICD-10-CM | POA: Diagnosis not present

## 2021-01-04 DIAGNOSIS — Z1231 Encounter for screening mammogram for malignant neoplasm of breast: Secondary | ICD-10-CM | POA: Diagnosis not present

## 2021-01-21 DIAGNOSIS — F331 Major depressive disorder, recurrent, moderate: Secondary | ICD-10-CM | POA: Diagnosis not present

## 2021-01-21 DIAGNOSIS — F411 Generalized anxiety disorder: Secondary | ICD-10-CM | POA: Diagnosis not present

## 2021-02-17 ENCOUNTER — Ambulatory Visit: Payer: Medicare Other | Admitting: Internal Medicine

## 2021-02-17 ENCOUNTER — Ambulatory Visit: Payer: Medicare Other

## 2021-02-24 ENCOUNTER — Other Ambulatory Visit: Payer: Self-pay

## 2021-02-24 ENCOUNTER — Ambulatory Visit (INDEPENDENT_AMBULATORY_CARE_PROVIDER_SITE_OTHER): Payer: Medicare Other

## 2021-02-24 ENCOUNTER — Encounter: Payer: Self-pay | Admitting: Internal Medicine

## 2021-02-24 ENCOUNTER — Ambulatory Visit (INDEPENDENT_AMBULATORY_CARE_PROVIDER_SITE_OTHER): Payer: Medicare Other | Admitting: Internal Medicine

## 2021-02-24 VITALS — BP 118/78 | HR 93 | Temp 98.3°F | Ht 63.0 in | Wt 147.0 lb

## 2021-02-24 VITALS — BP 118/78 | HR 93 | Temp 98.3°F | Ht 63.0 in | Wt 147.2 lb

## 2021-02-24 DIAGNOSIS — R0602 Shortness of breath: Secondary | ICD-10-CM

## 2021-02-24 DIAGNOSIS — F331 Major depressive disorder, recurrent, moderate: Secondary | ICD-10-CM | POA: Diagnosis not present

## 2021-02-24 DIAGNOSIS — Z79899 Other long term (current) drug therapy: Secondary | ICD-10-CM | POA: Diagnosis not present

## 2021-02-24 DIAGNOSIS — F17200 Nicotine dependence, unspecified, uncomplicated: Secondary | ICD-10-CM

## 2021-02-24 DIAGNOSIS — M79604 Pain in right leg: Secondary | ICD-10-CM | POA: Diagnosis not present

## 2021-02-24 DIAGNOSIS — D649 Anemia, unspecified: Secondary | ICD-10-CM | POA: Diagnosis not present

## 2021-02-24 DIAGNOSIS — I700 Atherosclerosis of aorta: Secondary | ICD-10-CM | POA: Diagnosis not present

## 2021-02-24 DIAGNOSIS — M79605 Pain in left leg: Secondary | ICD-10-CM | POA: Diagnosis not present

## 2021-02-24 DIAGNOSIS — E7800 Pure hypercholesterolemia, unspecified: Secondary | ICD-10-CM

## 2021-02-24 DIAGNOSIS — Z0000 Encounter for general adult medical examination without abnormal findings: Secondary | ICD-10-CM | POA: Diagnosis not present

## 2021-02-24 DIAGNOSIS — F17210 Nicotine dependence, cigarettes, uncomplicated: Secondary | ICD-10-CM | POA: Diagnosis not present

## 2021-02-24 NOTE — Progress Notes (Signed)
This visit occurred during the SARS-CoV-2 public health emergency.  Safety protocols were in place, including screening questions prior to the visit, additional usage of staff PPE, and extensive cleaning of exam room while observing appropriate contact time as indicated for disinfecting solutions.  Subjective:   Maureen Rodgers is a 70 y.o. female who presents for Medicare Annual (Subsequent) preventive examination.  Review of Systems     Cardiac Risk Factors include: advanced age (>42men, >75 women);dyslipidemia;sedentary lifestyle;smoking/ tobacco exposure     Objective:    Today's Vitals   02/24/21 1532  BP: 118/78  Pulse: 93  Temp: 98.3 F (36.8 C)  TempSrc: Oral  Weight: 147 lb (66.7 kg)  Height: 5\' 3"  (1.6 m)   Body mass index is 26.04 kg/m.  Advanced Directives 02/24/2021 02/27/2020 02/26/2020 02/12/2020 12/20/2018 10/23/2015  Does Patient Have a Medical Advance Directive? No No No No No No  Would patient like information on creating a medical advance directive? Yes (MAU/Ambulatory/Procedural Areas - Information given) No - Patient declined No - Patient declined Yes (MAU/Ambulatory/Procedural Areas - Information given) Yes (MAU/Ambulatory/Procedural Areas - Information given) No - patient declined information    Current Medications (verified) Outpatient Encounter Medications as of 02/24/2021  Medication Sig  . alendronate (FOSAMAX) 70 MG tablet TAKE 1 TABLET BY MOUTH ONE TIME PER WEEK  . ALPRAZolam (XANAX) 0.5 MG tablet Take 0.5 mg by mouth 3 (three) times daily as needed for anxiety or sleep.   . Calcium Carbonate-Vit D-Min (CALCIUM 1200 PO) Take 1 tablet by mouth daily at 12 noon.  . DULoxetine (CYMBALTA) 30 MG capsule Take 1 capsule (30 mg total) by mouth daily.  Marland Kitchen esomeprazole (NEXIUM) 40 MG capsule Take 40 mg by mouth daily before breakfast.  . fluticasone (FLONASE) 50 MCG/ACT nasal spray Place 1 spray into both nostrils daily.  Marland Kitchen ibuprofen (ADVIL) 400 MG tablet Take 1  tablet (400 mg total) by mouth every 6 (six) hours as needed for mild pain.  . Omega-3 Fatty Acids (FISH OIL) 1000 MG CAPS Take 1,000 mg by mouth daily.   Marland Kitchen PROAIR HFA 108 (90 Base) MCG/ACT inhaler Inhale 2 puffs into the lungs every 4 (four) hours as needed for wheezing or shortness of breath.  . traZODone (DESYREL) 50 MG tablet Take 50 mg by mouth at bedtime.   . Vitamin D, Ergocalciferol, (DRISDOL) 50000 UNITS CAPS capsule Take 50,000 Units by mouth See admin instructions. Pt takes twice weekly. Tuesday and Friday    No facility-administered encounter medications on file as of 02/24/2021.    Allergies (verified) Patient has no known allergies.   History: Past Medical History:  Diagnosis Date  . Depression   . GERD (gastroesophageal reflux disease)   . Tobacco use    Past Surgical History:  Procedure Laterality Date  . ABDOMINAL HYSTERECTOMY    . BILATERAL CARPAL TUNNEL RELEASE  2018   2018 on Right, many years ago on left  . EYE SURGERY    . INCISION AND DRAINAGE PERIRECTAL ABSCESS N/A 02/27/2020   Procedure: IRRIGATION AND DEBRIDEMENT PERIRECTAL ABSCESS;  Surgeon: Donnie Mesa, MD;  Location: [facility];  Service: General;  Laterality: N/A;  . IRRIGATION AND DEBRIDEMENT ABSCESS  02/27/2020   Procedure(s) Performed: IRRIGATION AND DEBRIDEMENT PERIRECTAL ABSCESS (N/A Perineum)   Family History  Problem Relation Age of Onset  . Heart disease Mother   . Dementia Mother   . Alcohol abuse Mother   . Alcohol abuse Father   . Diabetes Brother  Social History   Socioeconomic History  . Marital status: Single    Spouse name: Not on file  . Number of children: Not on file  . Years of education: Not on file  . Highest education level: Not on file  Occupational History  . Occupation: retired  Tobacco Use  . Smoking status: Current Every Day Smoker    Packs/day: 0.75    Years: 44.00    Pack years: 33.00    Types: Cigarettes    Start date: 10/03/1974  . Smokeless tobacco: Never  Used  . Tobacco comment: has been smoking 1ppd x 18 months  Vaping Use  . Vaping Use: Never used  Substance and Sexual Activity  . Alcohol use: Yes    Alcohol/week: 3.0 standard drinks    Types: 3 Cans of beer per week    Comment: 3 beers a day  . Drug use: No  . Sexual activity: Not Currently  Other Topics Concern  . Not on file  Social History Narrative  . Not on file   Social Determinants of Health   Financial Resource Strain: Low Risk   . Difficulty of Paying Living Expenses: Not hard at all  Food Insecurity: No Food Insecurity  . Worried About Charity fundraiser in the Last Year: Never true  . Ran Out of Food in the Last Year: Never true  Transportation Needs: No Transportation Needs  . Lack of Transportation (Medical): No  . Lack of Transportation (Non-Medical): No  Physical Activity: Inactive  . Days of Exercise per Week: 0 days  . Minutes of Exercise per Session: 0 min  Stress: No Stress Concern Present  . Feeling of Stress : Not at all  Social Connections: Not on file    Tobacco Counseling Ready to quit: Not Answered Counseling given: Not Answered Comment: has been smoking 1ppd x 18 months   Clinical Intake:  Pre-visit preparation completed: Yes  Pain : No/denies pain     Nutritional Status: BMI 25 -29 Overweight Nutritional Risks: None Diabetes: No  How often do you need to have someone help you when you read instructions, pamphlets, or other written materials from your doctor or pharmacy?: 1 - Never What is the last grade level you completed in school?: psychology degree  Diabetic? no  Interpreter Needed?: No  Information entered by :: NAllen LPN   Activities of Daily Living In your present state of health, do you have any difficulty performing the following activities: 02/24/2021 02/27/2020  Hearing? N N  Vision? N N  Difficulty concentrating or making decisions? N N  Walking or climbing stairs? Y N  Dressing or bathing? N N  Doing  errands, shopping? N N  Preparing Food and eating ? N -  Using the Toilet? N -  In the past six months, have you accidently leaked urine? N -  Do you have problems with loss of bowel control? N -  Managing your Medications? N -  Managing your Finances? N -  Housekeeping or managing your Housekeeping? N -  Some recent data might be hidden    Patient Care Team: Glendale Chard, MD as PCP - General (Internal Medicine)  Indicate any recent Medical Services you may have received from other than [facility] providers in the past year (date may be approximate).     Assessment:   This is a routine wellness examination for [place].  Hearing/Vision screen No exam data present  Dietary issues and exercise activities discussed: Current Exercise Habits: The  patient does not participate in regular exercise at present  Goals Addressed            This Visit's Progress   . Patient Stated       02/24/2021, quit smoking, find out about stinging sensation      Depression Screen PHQ 2/9 Scores 02/24/2021 07/27/2020 02/12/2020 08/14/2019 06/30/2019 12/24/2018 12/20/2018  PHQ - 2 Score 4 6 4  0 2 3 5   PHQ- 9 Score 8 21 11  - 3 12 13   Exception Documentation Other- indicate reason in comment box - - - - - -  Not completed already completed by CMA - - - - - -    Fall Risk Fall Risk  02/24/2021 02/24/2021 02/12/2020 08/14/2019 06/26/2019  Falls in the past year? 0 0 1 1 1   Comment - - fell coming out of attic, missed step; tripped over a root in the yard - -  Number falls in past yr: - - 1 1 1   Comment - - - - -  Injury with Fall? - - 0 0 0  Comment - - - - -  Risk for fall due to : Medication side effect - Medication side effect - -  Follow up Falls evaluation completed;Education provided;Falls prevention discussed - Falls evaluation completed;Education provided;Falls prevention discussed - -    FALL RISK PREVENTION PERTAINING TO THE HOME:  Any stairs in or around the home? Yes  If so, are there any  without handrails? No  Home free of loose throw rugs in walkways, pet beds, electrical cords, etc? Yes  Adequate lighting in your home to reduce risk of falls? Yes   ASSISTIVE DEVICES UTILIZED TO PREVENT FALLS:  Life alert? No  Use of a cane, walker or w/c? No  Grab bars in the bathroom? No  Shower chair or bench in shower? No  Elevated toilet seat or a handicapped toilet? Yes   TIMED UP AND GO:  Was the test performed? No . .   Gait steady and fast without use of assistive device  Cognitive Function:     6CIT Screen 02/24/2021 02/12/2020 12/20/2018  What Year? 0 points 0 points 0 points  What month? 0 points 0 points 0 points  What time? 0 points 0 points 0 points  Count back from 20 2 points 0 points 0 points  Months in reverse 0 points 0 points 0 points  Repeat phrase 4 points 4 points 2 points  Total Score 6 4 2     Immunizations Immunization History  Administered Date(s) Administered  . Fluad Quad(high Dose 65+) 08/11/2020  . Influenza, High Dose Seasonal PF 06/26/2019  . Moderna SARS-COV2 Booster Vaccination 09/11/2020  . Moderna Sars-Covid-2 Vaccination 10/29/2019, 11/26/2019  . Pneumococcal Conjugate-13 03/03/2020  . Tdap 03/03/2020  . Zoster Recombinat (Shingrix) 10/22/2019, 12/21/2019    TDAP status: Up to date  Flu Vaccine status: Up to date  Pneumococcal vaccine status: Up to date  Covid-19 vaccine status: Completed vaccines  Qualifies for Shingles Vaccine? Yes   Zostavax completed No   Shingrix Completed?: Yes  Screening Tests Health Maintenance  Topic Date Due  . PNA vac Low Risk Adult (2 of 2 - PPSV23) 03/03/2021  . INFLUENZA VACCINE  05/03/2021  . MAMMOGRAM  12/23/2021  . COLONOSCOPY (Pts 45-59yrs Insurance coverage will need to be confirmed)  03/31/2029  . TETANUS/TDAP  03/03/2030  . DEXA SCAN  Completed  . COVID-19 Vaccine  Completed  . Hepatitis C Screening  Completed  . HPV VACCINES  Aged Out    Health Maintenance  There are no  preventive care reminders to display for this patient.  Colorectal cancer screening: Type of screening: Colonoscopy. Completed 04/01/2019. Repeat every 3 years  Mammogram status: patient to verify if had  Bone Density status: Completed 12/24/2019.   Lung Cancer Screening: (Low Dose CT Chest recommended if Age 57-80 years, 30 pack-year currently smoking OR have quit w/in 15years.) does not qualify.   Lung Cancer Screening Referral: no  Additional Screening:  Hepatitis C Screening: does qualify; Completed 06/26/2019  Vision Screening: Recommended annual ophthalmology exams for early detection of glaucoma and other disorders of the eye. Is the patient up to date with their annual eye exam?  No  Who is the provider or what is the name of the office in which the patient attends annual eye exams? none If pt is not established with a provider, would they like to be referred to a provider to establish care? Yes .   Dental Screening: Recommended annual dental exams for proper oral hygiene  Community Resource Referral / Chronic Care Management: CRR required this visit?  No   CCM required this visit?  No      Plan:     I have personally reviewed and noted the following in the patient's chart:   . Medical and social history . Use of alcohol, tobacco or illicit drugs  . Current medications and supplements including opioid prescriptions.  . Functional ability and status . Nutritional status . Physical activity . Advanced directives . List of other physicians . Hospitalizations, surgeries, and ER visits in previous 12 months . Vitals . Screenings to include cognitive, depression, and falls . Referrals and appointments  In addition, I have reviewed and discussed with patient certain preventive protocols, quality metrics, and best practice recommendations. A written personalized care plan for preventive services as well as general preventive health recommendations were provided to  patient.     Kellie Simmering, LPN   [id number]   Nurse Notes:

## 2021-02-24 NOTE — Patient Instructions (Signed)
Maureen Rodgers , Thank you for taking time to come for your Medicare Wellness Visit. I appreciate your ongoing commitment to your health goals. Please review the following plan we discussed and let me know if I can assist you in the future.   Screening recommendations/referrals: Colonoscopy: completed 04/01/2019 Mammogram: patient to verify if had this year Bone Density: completed 12/24/2019 Recommended yearly ophthalmology/optometry visit for glaucoma screening and checkup Recommended yearly dental visit for hygiene and checkup  Vaccinations: Influenza vaccine: completed 08/11/2020 Pneumococcal vaccine: scheduled for 03/09/2021 Tdap vaccine: completed 03/03/2020 Shingles vaccine: completed   Covid-19: 09/11/2020, 11/26/2019, 10/29/2019  Advanced directives: Advance directive discussed with you today. I have provided a copy for you to complete at home and have notarized. Once this is complete please bring a copy in to our office so we can scan it into your chart.  Conditions/risks identified: smoking  Next appointment: Follow up in one year for your annual wellness visit    Preventive Care 70 Years and Older, Female Preventive care refers to lifestyle choices and visits with your health care provider that can promote health and wellness. What does preventive care include?  A yearly physical exam. This is also called an annual well check.  Dental exams once or twice a year.  Routine eye exams. Ask your health care provider how often you should have your eyes checked.  Personal lifestyle choices, including:  Daily care of your teeth and gums.  Regular physical activity.  Eating a healthy diet.  Avoiding tobacco and drug use.  Limiting alcohol use.  Practicing safe sex.  Taking low-dose aspirin every day.  Taking vitamin and mineral supplements as recommended by your health care provider. What happens during an annual well check? The services and screenings done by your health  care provider during your annual well check will depend on your age, overall health, lifestyle risk factors, and family history of disease. Counseling  Your health care provider may ask you questions about your:  Alcohol use.  Tobacco use.  Drug use.  Emotional well-being.  Home and relationship well-being.  Sexual activity.  Eating habits.  History of falls.  Memory and ability to understand (cognition).  Work and work Statistician.  Reproductive health. Screening  You may have the following tests or measurements:  Height, weight, and BMI.  Blood pressure.  Lipid and cholesterol levels. These may be checked every 5 years, or more frequently if you are over 70 years old.  Skin check.  Lung cancer screening. You may have this screening every year starting at age 70 if you have a 30-pack-year history of smoking and currently smoke or have quit within the past 15 years.  Fecal occult blood test (FOBT) of the stool. You may have this test every year starting at age 70.  Flexible sigmoidoscopy or colonoscopy. You may have a sigmoidoscopy every 5 years or a colonoscopy every 10 years starting at age 70.  Hepatitis C blood test.  Hepatitis B blood test.  Sexually transmitted disease (STD) testing.  Diabetes screening. This is done by checking your blood sugar (glucose) after you have not eaten for a while (fasting). You may have this done every 1-3 years.  Bone density scan. This is done to screen for osteoporosis. You may have this done starting at age 70.  Mammogram. This may be done every 1-2 years. Talk to your health care provider about how often you should have regular mammograms. Talk with your health care provider about your test results,  treatment options, and if necessary, the need for more tests. Vaccines  Your health care provider may recommend certain vaccines, such as:  Influenza vaccine. This is recommended every year.  Tetanus, diphtheria, and  acellular pertussis (Tdap, Td) vaccine. You may need a Td booster every 10 years.  Zoster vaccine. You may need this after age 74.  Pneumococcal 13-valent conjugate (PCV13) vaccine. One dose is recommended after age 70.  Pneumococcal polysaccharide (PPSV23) vaccine. One dose is recommended after age 70. Talk to your health care provider about which screenings and vaccines you need and how often you need them. This information is not intended to replace advice given to you by your health care provider. Make sure you discuss any questions you have with your health care provider. Document Released: 10/16/2015 Document Revised: 06/08/2016 Document Reviewed: 07/21/2015 Elsevier Interactive Patient Education  2017 [place] Prevention in the Home Falls can cause injuries. They can happen to people of all ages. There are many things you can do to make your home safe and to help prevent falls. What can I do on the outside of my home?  Regularly fix the edges of walkways and driveways and fix any cracks.  Remove anything that might make you trip as you walk through a door, such as a raised step or threshold.  Trim any bushes or trees on the path to your home.  Use bright outdoor lighting.  Clear any walking paths of anything that might make someone trip, such as rocks or tools.  Regularly check to see if handrails are loose or broken. Make sure that both sides of any steps have handrails.  Any raised decks and porches should have guardrails on the edges.  Have any leaves, snow, or ice cleared regularly.  Use sand or salt on walking paths during winter.  Clean up any spills in your garage right away. This includes oil or grease spills. What can I do in the bathroom?  Use night lights.  Install grab bars by the toilet and in the tub and shower. Do not use towel bars as grab bars.  Use non-skid mats or decals in the tub or shower.  If you need to sit down in the shower, use  a plastic, non-slip stool.  Keep the floor dry. Clean up any water that spills on the floor as soon as it happens.  Remove soap buildup in the tub or shower regularly.  Attach bath mats securely with double-sided non-slip rug tape.  Do not have throw rugs and other things on the floor that can make you trip. What can I do in the bedroom?  Use night lights.  Make sure that you have a light by your bed that is easy to reach.  Do not use any sheets or blankets that are too big for your bed. They should not hang down onto the floor.  Have a firm chair that has side arms. You can use this for support while you get dressed.  Do not have throw rugs and other things on the floor that can make you trip. What can I do in the kitchen?  Clean up any spills right away.  Avoid walking on wet floors.  Keep items that you use a lot in easy-to-reach places.  If you need to reach something above you, use a strong step stool that has a grab bar.  Keep electrical cords out of the way.  Do not use floor polish or wax that makes floors  slippery. If you must use wax, use non-skid floor wax.  Do not have throw rugs and other things on the floor that can make you trip. What can I do with my stairs?  Do not leave any items on the stairs.  Make sure that there are handrails on both sides of the stairs and use them. Fix handrails that are broken or loose. Make sure that handrails are as long as the stairways.  Check any carpeting to make sure that it is firmly attached to the stairs. Fix any carpet that is loose or worn.  Avoid having throw rugs at the top or bottom of the stairs. If you do have throw rugs, attach them to the floor with carpet tape.  Make sure that you have a light switch at the top of the stairs and the bottom of the stairs. If you do not have them, ask someone to add them for you. What else can I do to help prevent falls?  Wear shoes that:  Do not have high heels.  Have  rubber bottoms.  Are comfortable and fit you well.  Are closed at the toe. Do not wear sandals.  If you use a stepladder:  Make sure that it is fully opened. Do not climb a closed stepladder.  Make sure that both sides of the stepladder are locked into place.  Ask someone to hold it for you, if possible.  Clearly mark and make sure that you can see:  Any grab bars or handrails.  First and last steps.  Where the edge of each step is.  Use tools that help you move around (mobility aids) if they are needed. These include:  Canes.  Walkers.  Scooters.  Crutches.  Turn on the lights when you go into a dark area. Replace any light bulbs as soon as they burn out.  Set up your furniture so you have a clear path. Avoid moving your furniture around.  If any of your floors are uneven, fix them.  If there are any pets around you, be aware of where they are.  Review your medicines with your doctor. Some medicines can make you feel dizzy. This can increase your chance of falling. Ask your doctor what other things that you can do to help prevent falls. This information is not intended to replace advice given to you by your health care provider. Make sure you discuss any questions you have with your health care provider. Document Released: 07/16/2009 Document Revised: 02/25/2016 Document Reviewed: 10/24/2014 Elsevier Interactive Patient Education  2017 Elsevier Inc.  Smoking and Musculoskeletal Health Smoking is bad for your health. Most people know that smoking causes lung disease, heart disease, and cancer. But people may not realize that it also affects their bones, muscles, and joints (musculoskeletal system). When you smoke, the effects on your lungs and heart result in less oxygen for your musculoskeletal system. This can lead to poor bone and joint health. How can smoking affect my musculoskeletal health? Smoking can:  Increase your risk of having weak, thin bones  (osteoporosis). Elderly smokers are at higher risk for bone fractures related to osteoporosis.  Decrease the ability of bone-forming cells to make and replace bone (in addition to reducing oxygen and blood flow).  Reduce your body's ability to absorb calcium from your diet. Less calcium means weaker bones.  Interfere with the breakdown of the female hormone estrogen. Smoking lowers estrogen, which is a hormone that helps keep bones strong. Women who smoke may  have earlier menopause. Menopause is a risk factor for osteoporosis.  Weaken the tissues that attach bones to muscles (tendons). This can lead to shoulder, back, and other joint injuries.  Increase your risk of rheumatoid arthritis or make the condition worse if you already have it.  Slow down healing and increase your risk of infection and other complications if you have a bone fracture or surgery that involves your musculoskeletal system.  Make you get out of breath easily. This can keep you from getting the exercise you need to keep your bones and joints healthy.  Decrease your appetite and body mass. You may lose weight and muscle strength. This can put you at higher risk for muscle injury, joint injury, and broken bones. What actions can I take to prevent musculoskeletal problems? Quit smoking  Do not start smoking. Quit if you already do. Even stopping later in life can improve musculoskeletal health.  Do not use any products that contain nicotine or tobacco. Do not replace cigarette smoking with e-cigarettes. The safety of e-cigarettes is not known, and some may contain harmful chemicals.  Make a plan to quit smoking and commit to it. Look for programs to help you, and ask your health care provider for recommendations and ideas.  Talk with your health care provider about using nicotine replacement medicines to help you quit, such as gum, lozenges, patches, sprays, or pills.      Make other lifestyle changes  Eat a healthy  diet that includes calcium and vitamin D. These nutrients are important for bone health. ? Calcium is found in dairy foods and green leafy vegetables. ? Vitamin D is found in eggs, fish, and liver. ? Many foods also have vitamin D and calcium added to them (are fortified). ? Ask your health care provider if you would benefit from taking a supplement.  Get out in the sunshine for a short time every day. This increases production of vitamin D.  Get 30 minutes of exercise at least 5 days a week. Weight-bearing and strength exercises are best for musculoskeletal health. Ask your health care provider what type of exercise is safe for you.  Do not drink alcohol if: ? Your health care provider tells you not to drink. ? You are pregnant, may be pregnant, or are planning to become pregnant.  If you drink alcohol, limit how much you have: ? 0-1 drink a day for women. ? 0-2 drinks a day for men.  Be aware of how much alcohol is in your drink. In the [place] one drink equals one 12 oz bottle of beer (355 mL), one 5 oz glass of wine (148 mL), or one 1 oz glass of hard liquor (44 mL).   Where to find more information You may find more information about smoking, musculoskeletal health, and quitting smoking from:  [employer]: orthoinfo.aaos.[place], Osteoporosis and Related [place]: bones.SouthEx[place].es  HelpGuide.org: helpguide.org  https://hall.com/: smokefree.gov  American Lung Association: lung.org Contact a health care provider if:  You need help to quit smoking. Summary  When you smoke, the effects on your lungs and heart result in less oxygen for your musculoskeletal system.  Even stopping smoking later in life can improve musculoskeletal health.  Do not use any products that contain nicotine or tobacco, such as cigarettes and e-cigarettes.  If you need help quitting, ask your health care provider. This  information is not intended to replace advice given to you by  your health care provider. Make sure you discuss any questions you have with your health care provider. Document Revised: 06/14/2019 Document Reviewed: 01/15/2018 Elsevier Patient Education  [place].

## 2021-02-24 NOTE — Progress Notes (Signed)
I,Yamilka [employer] as a Education administrator for [name], MD.,have documented all relevant documentation on the behalf of [name], MD,as directed by  [name], MD while in the presence of [name], MD. This visit occurred during the SARS-CoV-2 public health emergency.  Safety protocols were in place, including screening questions prior to the visit, additional usage of staff PPE, and extensive cleaning of exam room while observing appropriate contact time as indicated for disinfecting solutions.  Subjective:     Patient ID: Maureen Rodgers , female    DOB: 08/30/1951 , 70 y.o.   MRN: [id number]   Chief Complaint  Patient presents with   Leg Pain    HPI  She presents today for further evaluation of bilateral leg pain. Initially started as a stinging pain. She then states she has a "movement in my feet/toes". For the past 2-3 weeks, she has had a "shock" in both legs. Unable to determine what triggers her sx. Sx occur with movement and at rest.    She is also scheduled for AWV with [facility] Advisor.     Past Medical History:  Diagnosis Date   Depression    GERD (gastroesophageal reflux disease)    Tobacco use      Family History  Problem Relation Age of Onset   Heart disease Mother    Dementia Mother    Alcohol abuse Mother    Alcohol abuse Father    Diabetes Brother      Current Outpatient Medications:    alendronate (FOSAMAX) 70 MG tablet, TAKE 1 TABLET BY MOUTH ONE TIME PER WEEK, Disp: 12 tablet, Rfl: 3   ALPRAZolam (XANAX) 0.5 MG tablet, Take 0.5 mg by mouth 3 (three) times daily as needed for anxiety or sleep. , Disp: , Rfl:    Biotin 10000 MCG TBDP, Take 1 tablet by mouth daily at 12 noon., Disp: , Rfl:    Calcium Carbonate-Vit D-Min (CALCIUM 1200 PO), Take 1 tablet by mouth daily at 12 noon., Disp: , Rfl:    DULoxetine (CYMBALTA) 30 MG capsule, Take 1 capsule (30 mg total) by mouth daily., Disp: 90 capsule, Rfl: 1   esomeprazole (NEXIUM) 40 MG  capsule, Take 40 mg by mouth daily before breakfast., Disp: , Rfl:    fluticasone (FLONASE) 50 MCG/ACT nasal spray, Place 1 spray into both nostrils daily., Disp: 16 g, Rfl: 5   ibuprofen (ADVIL) 400 MG tablet, Take 1 tablet (400 mg total) by mouth every 6 (six) hours as needed for mild pain., Disp: 30 tablet, Rfl: 0   Magnesium 500 MG CAPS, Take 1 capsule by mouth daily at 12 noon., Disp: , Rfl:    Omega-3 Fatty Acids (FISH OIL) 1000 MG CAPS, Take 1,000 mg by mouth daily. , Disp: , Rfl:    PROAIR HFA 108 (90 Base) MCG/ACT inhaler, Inhale 2 puffs into the lungs every 4 (four) hours as needed for wheezing or shortness of breath., Disp: , Rfl: 0   traZODone (DESYREL) 50 MG tablet, Take 50 mg by mouth at bedtime. , Disp: , Rfl:    Vitamin D, Ergocalciferol, (DRISDOL) 50000 UNITS CAPS capsule, Take 50,000 Units by mouth See admin instructions. Pt takes twice weekly. Tuesday and Friday , Disp: , Rfl:    No Known Allergies   Review of Systems  Constitutional: Negative.   Respiratory:  Positive for shortness of breath.        She c/o sob. Occurs w/ exertion. Does not exercise frequently.  Denies associated chest pain. Still smokes cigs. No associated palpitations.   Cardiovascular: Negative.   Gastrointestinal: Negative.   Psychiatric/Behavioral: Negative.      Today's Vitals   02/24/21 1514  BP: 118/78  Pulse: 93  Temp: 98.3 F (36.8 C)  Weight: 147 lb 3.2 oz (66.8 kg)  Height: _0  (1.6 m)  PainSc: 0-No pain   Body mass index is 26.08 kg/m.   Objective:  Physical Exam Vitals and nursing note reviewed.  Constitutional:      Appearance: Normal appearance.  HENT:     Head: Normocephalic and atraumatic.     Nose:     Comments: Masked     Mouth/Throat:     Comments: Masked  Eyes:     Extraocular Movements: Extraocular movements intact.  Cardiovascular:     Rate and Rhythm: Normal rate and regular rhythm.     Heart sounds: Normal heart sounds.  Pulmonary:     Effort: Pulmonary  effort is normal.     Breath sounds: Normal breath sounds.  Musculoskeletal:        General: Tenderness present.     Cervical back: Normal range of motion.  Skin:    General: Skin is warm.  Neurological:     General: No focal deficit present.     Mental Status: She is alert.  Psychiatric:        Mood and Affect: Mood normal.        Behavior: Behavior normal.    Assessment And Plan:     1. Pain in both lower extremities Comments: I will check thyroid function and Mg levels.  She is encouraged to stay well hydrated. She may benefit from mg supplementation.  - CMP14+EGFR - TSH - Magnesium - VAS [place] ABI WITH/WO TBI; Future  2. Atherosclerosis of aorta ([place]) Comments: I will check lipid panel. She is encouraged to follow heart healthy lifestyle.  - Lipid panel  3. Shortness of breath Comments: I will check CBC to r/o anemia. Pt advised due to smoking history, it could be COPD vs CAD. I will refer her for PFTs.  - CBC no Diff - CMP14+EGFR - Iron, TIBC and Ferritin Panel - Specimen status report - Pulmonary function test; Future  4. Moderate episode of recurrent major depressive disorder ([place]) Comments: Chronic, she will c/w 68m duloxetine daily.   5. Cigarette smoker Comments: She agrees to low dose CT chest to screen for lung cancer. She has greater than 30 pack year history of tobacco use.  - CT CHEST LUNG CA SCREEN LOW DOSE W/O CM; Future  6. Drug therapy - Biotin 10000 MCG TBDP; Take 1 tablet by mouth daily at 12 noon. - Magnesium 500 MG CAPS; Take 1 capsule by mouth daily at 12 noon. - Calcium Carbonate-Vit D-Min (CALCIUM 1200 PO); Take 1 tablet by mouth daily at 12 noon.     Patient was given opportunity to ask questions. Patient verbalized understanding of the plan and was able to repeat key elements of the plan. All questions were answered to their satisfaction.   I, RMaximino Greenland MD, have reviewed all documentation for this visit. The documentation on 02/24/21  for the exam, diagnosis, procedures, and orders are all accurate and complete.   IF YOU HAVE BEEN REFERRED TO A SPECIALIST, IT MAY TAKE 1-2 WEEKS TO SCHEDULE/PROCESS THE REFERRAL. IF YOU HAVE NOT HEARD FROM US/SPECIALIST IN TWO WEEKS, PLEASE GIVE [place] CALL AT [phone].   THE PATIENT IS ENCOURAGED  TO PRACTICE SOCIAL DISTANCING DUE TO THE COVID-19 PANDEMIC.

## 2021-02-25 LAB — LIPID PANEL
Chol/HDL Ratio: 1.8 ratio (ref 0.0–4.4)
Cholesterol, Total: 157 mg/dL (ref 100–199)
HDL: 88 mg/dL (ref >39)
LDL Chol Calc (NIH): 57 mg/dL (ref 0–99)
Triglycerides: 59 mg/dL (ref 0–149)
VLDL Cholesterol Cal: 12 mg/dL (ref 5–40)

## 2021-02-25 LAB — CBC
Hematocrit: 35 % (ref 34.0–46.6)
Hemoglobin: 10.7 g/dL — ABNORMAL LOW (ref 11.1–15.9)
MCH: 22.2 pg — ABNORMAL LOW (ref 26.6–33.0)
MCHC: 30.6 g/dL — ABNORMAL LOW (ref 31.5–35.7)
MCV: 73 fL — ABNORMAL LOW (ref 79–97)
Platelets: 345 10*3/uL (ref 150–450)
RBC: 4.83 x10E6/uL (ref 3.77–5.28)
RDW: 18.8 % — ABNORMAL HIGH (ref 11.7–15.4)
WBC: 5.3 10*3/uL (ref 3.4–10.8)

## 2021-02-25 LAB — CMP14+EGFR
ALT: 9 IU/L (ref 0–32)
AST: 19 IU/L (ref 0–40)
Albumin/Globulin Ratio: 1.8 (ref 1.2–2.2)
Albumin: 4.4 g/dL (ref 3.8–4.8)
Alkaline Phosphatase: 96 IU/L (ref 44–121)
BUN/Creatinine Ratio: 5 — ABNORMAL LOW (ref 12–28)
BUN: 5 mg/dL — ABNORMAL LOW (ref 8–27)
Bilirubin Total: 0.4 mg/dL (ref 0.0–1.2)
CO2: 24 mmol/L (ref 20–29)
Calcium: 9.9 mg/dL (ref 8.7–10.3)
Chloride: 102 mmol/L (ref 96–106)
Creatinine, Ser: 0.97 mg/dL (ref 0.57–1.00)
Globulin, Total: 2.4 g/dL (ref 1.5–4.5)
Glucose: 96 mg/dL (ref 65–99)
Potassium: 4.6 mmol/L (ref 3.5–5.2)
Sodium: 139 mmol/L (ref 134–144)
Total Protein: 6.8 g/dL (ref 6.0–8.5)
eGFR: 63 mL/min/{1.73_m2} (ref >59)

## 2021-02-25 LAB — TSH: TSH: 1.89 u[IU]/mL (ref 0.450–4.500)

## 2021-02-25 LAB — MAGNESIUM: Magnesium: 2.4 mg/dL — ABNORMAL HIGH (ref 1.6–2.3)

## 2021-03-03 LAB — IRON,TIBC AND FERRITIN PANEL
Ferritin: 8 ng/mL — ABNORMAL LOW (ref 15–150)
Iron Saturation: 6 % — CL (ref 15–55)
Iron: 24 ug/dL — ABNORMAL LOW (ref 27–139)
Total Iron Binding Capacity: 410 ug/dL (ref 250–450)
UIBC: 386 ug/dL — ABNORMAL HIGH (ref 118–369)

## 2021-03-03 LAB — SPECIMEN STATUS REPORT

## 2021-03-05 DIAGNOSIS — F411 Generalized anxiety disorder: Secondary | ICD-10-CM | POA: Diagnosis not present

## 2021-03-09 ENCOUNTER — Other Ambulatory Visit (INDEPENDENT_AMBULATORY_CARE_PROVIDER_SITE_OTHER): Payer: Medicare Other

## 2021-03-09 ENCOUNTER — Ambulatory Visit (INDEPENDENT_AMBULATORY_CARE_PROVIDER_SITE_OTHER): Payer: Medicare Other

## 2021-03-09 ENCOUNTER — Other Ambulatory Visit: Payer: Self-pay

## 2021-03-09 VITALS — BP 120/76 | HR 83 | Temp 98.4°F | Ht 63.0 in | Wt 145.0 lb

## 2021-03-09 DIAGNOSIS — Z23 Encounter for immunization: Secondary | ICD-10-CM

## 2021-03-09 DIAGNOSIS — D649 Anemia, unspecified: Secondary | ICD-10-CM

## 2021-03-09 LAB — HEMOCCULT GUIAC POC 1CARD (OFFICE)
Card #2 Fecal Occult Blod, POC: NEGATIVE
Card #3 Fecal Occult Blood, POC: NEGATIVE
Fecal Occult Blood, POC: NEGATIVE

## 2021-03-09 NOTE — Progress Notes (Signed)
Pt is here for pneumonia vaccine.

## 2021-03-14 DIAGNOSIS — I700 Atherosclerosis of aorta: Secondary | ICD-10-CM | POA: Insufficient documentation

## 2021-03-14 DIAGNOSIS — R0609 Other forms of dyspnea: Secondary | ICD-10-CM | POA: Insufficient documentation

## 2021-03-14 DIAGNOSIS — M79605 Pain in left leg: Secondary | ICD-10-CM | POA: Insufficient documentation

## 2021-03-14 DIAGNOSIS — M79604 Pain in right leg: Secondary | ICD-10-CM | POA: Insufficient documentation

## 2021-03-14 DIAGNOSIS — F17210 Nicotine dependence, cigarettes, uncomplicated: Secondary | ICD-10-CM | POA: Insufficient documentation

## 2021-03-17 ENCOUNTER — Other Ambulatory Visit: Payer: Self-pay

## 2021-03-17 ENCOUNTER — Ambulatory Visit (HOSPITAL_COMMUNITY)
Admission: RE | Admit: 2021-03-17 | Discharge: 2021-03-17 | Disposition: A | Discharge status: Home / Self Care | Payer: Medicare Other | Source: Ambulatory Visit | Attending: Internal Medicine | Admitting: Internal Medicine

## 2021-03-17 DIAGNOSIS — M79604 Pain in right leg: Secondary | ICD-10-CM | POA: Insufficient documentation

## 2021-03-17 DIAGNOSIS — M79605 Pain in left leg: Secondary | ICD-10-CM | POA: Diagnosis not present

## 2021-03-30 ENCOUNTER — Ambulatory Visit
Admission: RE | Admit: 2021-03-30 | Discharge: 2021-03-30 | Disposition: A | Discharge status: Home / Self Care | Payer: Medicare Other | Source: Ambulatory Visit | Attending: Internal Medicine | Admitting: Internal Medicine

## 2021-03-30 ENCOUNTER — Other Ambulatory Visit: Payer: Self-pay

## 2021-03-30 DIAGNOSIS — F17210 Nicotine dependence, cigarettes, uncomplicated: Secondary | ICD-10-CM

## 2021-03-30 DIAGNOSIS — Z87891 Personal history of nicotine dependence: Secondary | ICD-10-CM | POA: Diagnosis not present

## 2021-04-09 DIAGNOSIS — F411 Generalized anxiety disorder: Secondary | ICD-10-CM | POA: Diagnosis not present

## 2021-04-15 DIAGNOSIS — F331 Major depressive disorder, recurrent, moderate: Secondary | ICD-10-CM | POA: Diagnosis not present

## 2021-04-15 DIAGNOSIS — F411 Generalized anxiety disorder: Secondary | ICD-10-CM | POA: Diagnosis not present

## 2021-05-14 DIAGNOSIS — F411 Generalized anxiety disorder: Secondary | ICD-10-CM | POA: Diagnosis not present

## 2021-06-03 DIAGNOSIS — H04123 Dry eye syndrome of bilateral lacrimal glands: Secondary | ICD-10-CM | POA: Diagnosis not present

## 2021-06-03 DIAGNOSIS — H02889 Meibomian gland dysfunction of unspecified eye, unspecified eyelid: Secondary | ICD-10-CM | POA: Diagnosis not present

## 2021-06-11 DIAGNOSIS — F411 Generalized anxiety disorder: Secondary | ICD-10-CM | POA: Diagnosis not present

## 2021-07-06 DIAGNOSIS — F331 Major depressive disorder, recurrent, moderate: Secondary | ICD-10-CM | POA: Diagnosis not present

## 2021-07-06 DIAGNOSIS — F411 Generalized anxiety disorder: Secondary | ICD-10-CM | POA: Diagnosis not present

## 2021-07-16 DIAGNOSIS — F411 Generalized anxiety disorder: Secondary | ICD-10-CM | POA: Diagnosis not present

## 2021-07-19 ENCOUNTER — Ambulatory Visit (INDEPENDENT_AMBULATORY_CARE_PROVIDER_SITE_OTHER): Payer: Medicare Other | Admitting: Otolaryngology

## 2021-07-26 DIAGNOSIS — Z23 Encounter for immunization: Secondary | ICD-10-CM | POA: Diagnosis not present

## 2021-07-27 DIAGNOSIS — Z23 Encounter for immunization: Secondary | ICD-10-CM | POA: Diagnosis not present

## 2021-08-20 DIAGNOSIS — F411 Generalized anxiety disorder: Secondary | ICD-10-CM | POA: Diagnosis not present

## 2021-08-24 DIAGNOSIS — R635 Abnormal weight gain: Secondary | ICD-10-CM | POA: Diagnosis not present

## 2021-08-24 DIAGNOSIS — F32A Depression, unspecified: Secondary | ICD-10-CM | POA: Diagnosis not present

## 2021-08-24 DIAGNOSIS — K5730 Diverticulosis of large intestine without perforation or abscess without bleeding: Secondary | ICD-10-CM | POA: Diagnosis not present

## 2021-08-24 DIAGNOSIS — K219 Gastro-esophageal reflux disease without esophagitis: Secondary | ICD-10-CM | POA: Diagnosis not present

## 2021-08-24 DIAGNOSIS — Z86010 Personal history of colonic polyps: Secondary | ICD-10-CM | POA: Diagnosis not present

## 2021-08-24 DIAGNOSIS — F419 Anxiety disorder, unspecified: Secondary | ICD-10-CM | POA: Diagnosis not present

## 2021-08-30 ENCOUNTER — Other Ambulatory Visit: Payer: Self-pay

## 2021-08-30 ENCOUNTER — Ambulatory Visit (INDEPENDENT_AMBULATORY_CARE_PROVIDER_SITE_OTHER): Payer: Medicare Other | Admitting: Internal Medicine

## 2021-08-30 ENCOUNTER — Encounter: Payer: Self-pay | Admitting: Internal Medicine

## 2021-08-30 VITALS — BP 116/78 | HR 100 | Temp 98.8°F | Ht 62.8 in | Wt 146.8 lb

## 2021-08-30 DIAGNOSIS — I700 Atherosclerosis of aorta: Secondary | ICD-10-CM

## 2021-08-30 DIAGNOSIS — H9201 Otalgia, right ear: Secondary | ICD-10-CM

## 2021-08-30 DIAGNOSIS — D508 Other iron deficiency anemias: Secondary | ICD-10-CM

## 2021-08-30 DIAGNOSIS — H04203 Unspecified epiphora, bilateral lacrimal glands: Secondary | ICD-10-CM

## 2021-08-30 DIAGNOSIS — F1020 Alcohol dependence, uncomplicated: Secondary | ICD-10-CM

## 2021-08-30 DIAGNOSIS — D649 Anemia, unspecified: Secondary | ICD-10-CM | POA: Diagnosis not present

## 2021-08-30 DIAGNOSIS — F17210 Nicotine dependence, cigarettes, uncomplicated: Secondary | ICD-10-CM

## 2021-08-30 DIAGNOSIS — R0602 Shortness of breath: Secondary | ICD-10-CM

## 2021-08-30 DIAGNOSIS — Z79899 Other long term (current) drug therapy: Secondary | ICD-10-CM | POA: Diagnosis not present

## 2021-08-30 NOTE — Progress Notes (Signed)
I,Tianna Badgett,acting as a Education administrator for [name], MD.,have documented all relevant documentation on the behalf of [name], MD,as directed by  [name], MD while in the presence of [name], MD.  This visit occurred during the SARS-CoV-2 public health emergency.  Safety protocols were in place, including screening questions prior to the visit, additional usage of staff PPE, and extensive cleaning of exam room while observing appropriate contact time as indicated for disinfecting solutions.  Subjective:     Patient ID: Maureen Rodgers , female    DOB: 1951/07/21 , 70 y.o.   MRN: [id number]   Chief Complaint  Patient presents with   Anemia    HPI  Patient is here for follow up of anemia. She denies seeing any blood in her stools and denies gingival bleeding. She does admit to some fatigue.   Anemia Presents for follow-up visit. There has been no anorexia, malaise/fatigue, pallor or palpitations.    Past Medical History:  Diagnosis Date   Depression    GERD (gastroesophageal reflux disease)    Tobacco use      Family History  Problem Relation Age of Onset   Heart disease Mother    Dementia Mother    Alcohol abuse Mother    Alcohol abuse Father    Diabetes Brother      Current Outpatient Medications:    alendronate (FOSAMAX) 70 MG tablet, TAKE 1 TABLET BY MOUTH ONE TIME PER WEEK, Disp: 12 tablet, Rfl: 3   ALPRAZolam (XANAX) 0.5 MG tablet, Take 0.5 mg by mouth 3 (three) times daily as needed for anxiety or sleep. , Disp: , Rfl:    Biotin 10000 MCG TBDP, Take 1 tablet by mouth daily at 12 noon., Disp: , Rfl:    Calcium Carbonate-Vit D-Min (CALCIUM 1200 PO), Take 1 tablet by mouth daily at 12 noon., Disp: , Rfl:    DULoxetine (CYMBALTA) 30 MG capsule, Take 1 capsule (30 mg total) by mouth daily., Disp: 90 capsule, Rfl: 1   fluticasone (FLONASE) 50 MCG/ACT nasal spray, Place 1 spray into both nostrils daily., Disp: 16 g, Rfl: 5   ibuprofen (ADVIL) 400  MG tablet, Take 1 tablet (400 mg total) by mouth every 6 (six) hours as needed for mild pain., Disp: 30 tablet, Rfl: 0   Magnesium 500 MG CAPS, Take 1 capsule by mouth daily at 12 noon., Disp: , Rfl:    Omega-3 Fatty Acids (FISH OIL) 1000 MG CAPS, Take 1,000 mg by mouth daily. , Disp: , Rfl:    traZODone (DESYREL) 50 MG tablet, Take 50 mg by mouth at bedtime. , Disp: , Rfl:    Vitamin D, Ergocalciferol, (DRISDOL) 50000 UNITS CAPS capsule, Take 50,000 Units by mouth See admin instructions. Pt takes twice weekly. Tuesday and Friday , Disp: , Rfl:    Iron-FA-B Cmp-C-Biot-Probiotic (FUSION PLUS) CAPS, Take 1 capsule by mouth daily, Disp: 30 capsule, Rfl: 3   PROAIR HFA 108 (90 Base) MCG/ACT inhaler, Inhale 2 puffs into the lungs every 4 (four) hours as needed for wheezing or shortness of breath. (Patient not taking: Reported on 08/30/2021), Disp: , Rfl: 0   No Known Allergies   Review of Systems  Constitutional: Negative.  Negative for malaise/fatigue.  HENT:  Positive for ear pain. Negative for hearing loss.        She c/o persistent ear pain. Unsatisfied with prior ENT evaluation. Wants to see new provider. She has yet to have dental evaluation for TMJ  but states "I had a lot of work done".   Respiratory:  Positive for shortness of breath.   Cardiovascular: Negative.  Negative for palpitations.  Gastrointestinal: Negative.  Negative for anorexia.  Skin:  Negative for pallor.  Neurological: Negative.     Today's Vitals   08/30/21 1054  BP: 116/78  Pulse: 100  Temp: 98.8 F (37.1 C)  Weight: 146 lb 12.8 oz (66.6 kg)  Height: 5' 2.8" (1.595 m)  PainSc: 0-No pain   Body mass index is 26.17 kg/m.   Objective:  Physical Exam Vitals and nursing note reviewed.  Constitutional:      Appearance: Normal appearance.  HENT:     Head: Normocephalic and atraumatic.     Right Ear: Tympanic membrane, ear canal and external ear normal. There is no impacted cerumen.     Left Ear: Tympanic  membrane, ear canal and external ear normal. There is no impacted cerumen.     Nose:     Comments: Masked     Mouth/Throat:     Comments: Masked  Eyes:     Extraocular Movements: Extraocular movements intact.  Cardiovascular:     Rate and Rhythm: Normal rate and regular rhythm.     Heart sounds: Normal heart sounds.  Pulmonary:     Effort: Pulmonary effort is normal.     Breath sounds: Normal breath sounds.  Musculoskeletal:     Cervical back: Normal range of motion.  Skin:    General: Skin is warm.  Neurological:     General: No focal deficit present.     Mental Status: She is alert.  Psychiatric:        Mood and Affect: Mood normal.        Behavior: Behavior normal.        Assessment And Plan:     1. Other iron deficiency anemia Comments: Unfortunately, she is not taking any oral iron supplementation. I will check labs as below and address as needed. Denies gingival/rectal bleeding. I will make further recommendations once her labs are available for review.  - Iron, TIBC and Ferritin Panel - CBC no Diff  2. Shortness of breath Comments: Due to cardiac risk factors, I will check EKG today. She also agrees to referral for echocardiogram. She is aware the anemia could be a contributing factor.  - Ambulatory referral to Cardiology - ECHOCARDIOGRAM COMPLETE; Future - EKG 12-Lead  3. Atherosclerosis of aorta ([place]) Comments: She agrees to start statin therapy. I will send rx simvastatin 10mg  daily. I will have her return in 6-8 weeks for re-evaluation. Encouraged to follow heart healthy lifestyle.  - Ambulatory referral to Cardiology  4. Alcohol use disorder, moderate, dependence ([place]) Comments: She was made aware that 14 drinks/week is at risk drinking for females. Encouraged to cut back to no more than 1 drink/day. Risks associated with daily alcohol use discussed with patient. Over time, excessive alcohol use can lead to the development of chronic diseases and other serious  problems including: High blood pressure, heart disease, stroke, liver disease, and digestive problems. Cancer of the breast, mouth, throat, esophagus, voice box, liver, colon, and rectum. She has spent a month in rehab in the past. She is encouraged to discuss coping mechanisms with current therapist. She agrees to also [facility].   5. Cigarette smoker Comments: Importance of smoking cessation was d/w pt. She is encouraged to decrease number of cigs smoked per day, since she is not ready to quit at this  time.   6. Otalgia, right Comments: I will refer her to ENT as requested.  - Ambulatory referral to ENT  7. 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 She was not satisfied w/ his care and requests referral to new provider.  - Ambulatory referral to Ophthalmology   Alcohol Use Education Information about Your Drinking Your score on the Alcohol Use Disorders Identification Test was: AUDIT C:  5 TOTAL AUDIT SCORE:  9.  This score places you in the category of:  Score 0 = Abstainers Score 8-19 = Unhealthy/High Risk Drinkers  Score 1-7 = Low Risk Drinkers Score 20+ = Probable Alcohol Dependence   High Scores (20+) on the Alcohol Use Identification Test Consider becoming involved in a structured program.  You should stop drinking if: You have tried to cut down before but have not been successful, or  You suffer from morning shakes during a heavy drinking period, or You have high blood pressure, or You are pregnant, or You have liver disease, or You are taking medicines that react with alcohol, or Your alcohol use is affecting your social relationships, or You have legal consequences like DUIs, or You call in sick to work, or You cannot take care of our children, or Someone close to you says you drink too much    How Much Alcohol is a Drink: Beer: 12 oz. = 1 drink 16 oz. = 1.3 drinks 22 oz. = 2  drinks 40 oz. = 3.3 drinks  Wine: 5 oz. = 1 drink 740 mL (25 oz.) bottle = 5 drinks Malt Liquor: 12 oz. = 1.5 drinks 16 oz. = 2 drinks 22 oz. = 2.5 drinks 40 oz. = 4.5 drinks  80-Proof Spirits - Hard Liquor: 1 shot = 1 drink 1 mixed drink = number of shots Can equal 1-3 drinks   What is Low-risk Drinking? Have no more than 2 drinks of alcohol per day Drink no more than 5 days per week Do not drink alcohol drink alcohol when: You drive or operate machinery You are pregnant or breast feeding You are taking medications that interact with alcohol You have medical conditions made worse with alcohol You can stop or control your drinking      Identify Your Triggers for Drinking Parties Particular People Feeling lonely Feeling tense Family problems Feeling sad Feeling happy Feeling bored After work Problems sleeping Criticism Feelings of failure After being paid When others are drinking In bars When out for dinner After arguments Weekends Feeling restless Being in pain   Effects of High-Risk Drinking To the Brain: Aggressive, irrational behavior Arguments, violence Depression, nervousness Alcohol dependence, memory loss To the Nervous System: Trembling hands, tingling fingers Numbness, painful nerves Impaired sensation leading to falls Numb tingling toes To Your Lifestyle: Social, legal, medical problems Domestic trouble/relationship loss Job loss & financial problems Shortened life span Accidents and death from drunk driving   To the Face: Premature aging, drinker's nose Cancer of the throat & mouth To the Body: Frequent cold Reduced resistance to infection Increased risk of pneumonia Weakness of heart muscle Heart failure, anemia Impaired blood clotting Breast cancer Vitamin deficiency, bleeding Severe Inflammation of the stomach Vomiting, diarrhea, malnutrition Ulcer, inflammation of the pancreas Impaired sexual performance Birth defects,  including deformities, retardation, and low birthweight   Ways to Cope Without Drinking Go home if you tend to drink after work Find another activity Switch to nonalcoholic beverages Change friends Join a club  Volunteer Visit relatives Plan/take a trip Go for a walk Take up a hobby Listen to music Talk to a friend Reading What would you do if you had no worries about failing?         Good Reasons for Drinking Less I will live longer - probably 8-10 years. I will sleep better. I will be happier. I will save a lot of money My relationships will improve. I will stay younger for longer. I will achieve more in my life There will be a greater chance that I will survive to a healthy old age with no premature damage to my brain.  I will be better at my job. I will be less likely to feel depressed and commit suicide (6 times less likely). I will be less likely to die of heart disease or cancer. Other people will respect me I will be less likely to get into trouble with the police. The possibility that I will die of liver disease will be dramatically reduced (12 times less likely). It will be less likely that I will die in a car accident (3 times less likely).   Strategies for Cutting Down Keep Track.  Find a way to keep track of how much you drink.  If you make a note of each drink before you drink it, this will help slow you down. Count and Measure.  Know the standard drink sizes.  Ask the bartender or server about the amount of alcohol in a mixed drink. Set Goals.  Decide how many days a week you will drink and how many drinks each day. Pace and Space.  When you do drink, pace yourself.  Have no more than one drink with alcohol per hour.  Alternate "drink spacers" non-alcoholic drinks such as water, soda, or juice with drinks containing alcohol. Include Food.  Don't drink on an empty stomach.  Have some food so the alcohol will be absorbed more slowly into your system.  Avoid  Triggers.  Avoid people, places, or activities that have led to drinking in the past.  Certain times of day or feelings may also be triggers.  Make a plan so you will know what you can do instead of drinking. Plan to Handle Urges.  When an urge hits, consider these options:  Remind yourself of your reasons for changing.  Or talk it through with someone you trust. Or get involved with a healthy, distracting activity.  Or, "urge surf" - instead of fighting the feeling, accept it and ride it out, knowing it will soon crest like a wave and pass. Know Your "No".  Have a polite, convincing "no thanks" for those times when you may be offered a drink and don't want one.  The faster you can say no to these offers, the less likely you are to give in.  If you hesitate, it allows you time to think of excuses to go along.     Patient was given opportunity to ask questions. Patient verbalized understanding of the plan and was able to repeat key elements of the plan. All questions were answered to their satisfaction.   I, [name], MD, have reviewed all documentation for this visit. The documentation on 08/30/21 for the exam, diagnosis, procedures, and orders are all accurate and complete.   IF YOU HAVE BEEN REFERRED TO A SPECIALIST, IT MAY TAKE 1-2 WEEKS TO SCHEDULE/PROCESS THE REFERRAL. IF YOU HAVE NOT HEARD FROM US/SPECIALIST IN TWO WEEKS, PLEASE GIVE [place] A CALL AT [phone]  X [phone].   THE PATIENT IS ENCOURAGED TO PRACTICE SOCIAL DISTANCING DUE TO THE COVID-19 PANDEMIC.

## 2021-08-30 NOTE — Patient Instructions (Addendum)
Alcohol Abuse and Dependence Information, Adult Alcohol is a widely available drug. People drink alcohol in different amounts. People who drink alcohol very often and in large amounts often have problems during and after drinking. They may develop what is called an alcohol use disorder. There are two main types of alcohol use disorders: Alcohol abuse. This is when you use alcohol too much or too often. You may use alcohol to make yourself feel happy or to reduce stress. You may have a hard time setting a limit on the amount you drink. Alcohol dependence. This is when you use alcohol consistently for a period of time, and your body changes as a result. This can make it hard to stop drinking because you may start to feel sick or feel different when you do not use alcohol. These symptoms are known as withdrawal. How can alcohol abuse and dependence affect me? Alcohol abuse and dependence can have a negative effect on your life. Drinking too much can lead to addiction. You may feel like you need alcohol to function normally. You may drink alcohol before work in the morning, during the day, or as soon as you get home from work in the evening. These actions can result in: Poor work performance. Job loss. Financial problems. Car crashes or criminal charges from driving after drinking alcohol. Problems in your relationships with friends and family. Losing the trust and respect of coworkers, friends, and family. Drinking heavily over a long period of time can permanently damage your body and brain, and can cause lifelong health issues, such as: Damage to your liver or pancreas. Heart problems, high blood pressure, or stroke. Certain cancers. Decreased ability to fight infections. Brain or nerve damage. Depression. Early (premature) death. If you are careless or you crave alcohol, it is easy to drink more than your body can handle (overdose). Alcohol overdose is a serious situation that requires  hospitalization. It may lead to permanent injuries or death. What can increase my risk? Having a family history of alcohol abuse. Having depression or other mental health conditions. Beginning to drink at an early age. Binge drinking often. Experiencing trauma, stress, and an unstable home life during childhood. Spending time with people who drink often. What actions can I take to prevent or manage alcohol abuse and dependence? Do not drink alcohol if: Your health care provider tells you not to drink. You are pregnant, may be pregnant, or are planning to become pregnant. If you drink alcohol: Limit how much you use to: 0-1 drink a day for women. 0-2 drinks a day for men. Be aware of how much alcohol is in your drink. In the [place] one drink equals one 12 oz bottle of beer (355 mL), one 5 oz glass of wine (148 mL), or one 1 oz glass of hard liquor (44 mL). Stop drinking if you have been drinking too much. This can be very hard to do if you are used to abusing alcohol. If you begin to have withdrawal symptoms, talk with your health care provider or a person that you trust. These symptoms may include anxiety, shaky hands, headache, nausea, sweating, or not being able to sleep. Choose to drink nonalcoholic beverages in social gatherings and places where there may be alcohol. Activity Spend more time on activities that you enjoy that do not involve alcohol, like hobbies or exercise. Find healthy ways to cope with stress, such as exercise, meditation, or spending time with people you care about. General information Talk to your family,   coworkers, and friends about supporting you in your efforts to stop drinking. If they drink, ask them not to drink around you. Spend more time with people who do not drink alcohol. If you think that you have an alcohol dependency problem: Tell friends or family about your concerns. Talk with your health care provider or another health professional about where to  get help. Work with a Transport planner and a Regulatory affairs officer. Consider joining a support group for people who struggle with alcohol abuse and dependence. Where to find support  Your health care provider. SMART Recovery: www.smartrecovery.org Therapy and support groups Local treatment centers or chemical dependency counselors. Local AA groups in your community: NicTax.com.pt Where to find more information Centers for Disease Control and Prevention: http://www.wolf.info/ National Institute on Alcohol Abuse and Alcoholism: http://www.bradshaw.com/ Alcoholics Anonymous (AA): NicTax.com.pt Contact a health care provider if: You drank more or for longer than you intended on more than one occasion. You tried to stop drinking or to cut back on how much you drink, but you were not able to. You often drink to the point of vomiting or passing out. You want to drink so badly that you cannot think about anything else. You have problems in your life due to drinking, but you continue to drink. You keep drinking even though you feel anxious, depressed, or have experienced memory loss. You have stopped doing the things you used to enjoy in order to drink. You have to drink more than you used to in order to get the effect you want. You experience anxiety, sweating, nausea, shakiness, and trouble sleeping when you try to stop drinking. Get help right away if: You have thoughts about hurting yourself or others. You have serious withdrawal symptoms, including: Confusion. Racing heart. High blood pressure. Fever. If you ever feel like you may hurt yourself or others, or have thoughts about taking your own life, get help right away. You can go to your nearest emergency department or call: Your local emergency services (911 in the [place] A suicide crisis helpline, such as the [facility] at 802 162 3984 or 988 in the [place]. This is open 24 hours a day. Summary Alcohol abuse and dependence can  have a negative effect on your life. Drinking too much or too often can lead to addiction. If you drink alcohol, limit how much you use. If you are having trouble keeping your drinking under control, find ways to change your behavior. Hobbies, calming activities, exercise, or support groups can help. If you feel you need help with changing your drinking habits, talk with your health care provider, a good friend, or a therapist, or go to an [place] group. This information is not intended to replace advice given to you by your health care provider. Make sure you discuss any questions you have with your health care provider. Document Revised: 04/14/2021 Document Reviewed: 11/27/2018 Elsevier Patient Education  [place].  Alcohol Use Education Information about Your Drinking Your score on the Alcohol Use Disorders Identification Test was: AUDIT C:  5 TOTAL AUDIT SCORE:  9.  This score places you in the category of:  Score 0 = Abstainers Score 8-19 = Unhealthy/High Risk Drinkers  Score 1-7 = Low Risk Drinkers Score 20+ = Probable Alcohol Dependence   High Scores (20+) on the Alcohol Use Identification Test Consider becoming involved in a structured program.  You should stop drinking if: You have tried to cut down before but have not been successful, or  You  suffer from morning shakes during a heavy drinking period, or You have high blood pressure, or You are pregnant, or You have liver disease, or You are taking medicines that react with alcohol, or Your alcohol use is affecting your social relationships, or You have legal consequences like DUIs, or You call in sick to work, or You cannot take care of our children, or Someone close to you says you drink too much    How Much Alcohol is a Drink: Beer: 12 oz. = 1 drink 16 oz. = 1.3 drinks 22 oz. = 2 drinks 40 oz. = 3.3 drinks  Wine: 5 oz. = 1 drink 740 mL (25 oz.) bottle = 5 drinks Malt Liquor: 12 oz. = 1.5 drinks 16 oz. = 2  drinks 22 oz. = 2.5 drinks 40 oz. = 4.5 drinks  80-Proof Spirits - Hard Liquor: 1 shot = 1 drink 1 mixed drink = number of shots Can equal 1-3 drinks   What is Low-risk Drinking? Have no more than 2 drinks of alcohol per day Drink no more than 5 days per week Do not drink alcohol drink alcohol when: You drive or operate machinery You are pregnant or breast feeding You are taking medications that interact with alcohol You have medical conditions made worse with alcohol You can stop or control your drinking      Identify Your Triggers for Drinking Parties Particular People Feeling lonely Feeling tense Family problems Feeling sad Feeling happy Feeling bored After work Problems sleeping Criticism Feelings of failure After being paid When others are drinking In bars When out for dinner After arguments Weekends Feeling restless Being in pain   Effects of High-Risk Drinking To the Brain: Aggressive, irrational behavior Arguments, violence Depression, nervousness Alcohol dependence, memory loss To the Nervous System: Trembling hands, tingling fingers Numbness, painful nerves Impaired sensation leading to falls Numb tingling toes To Your Lifestyle: Social, legal, medical problems Domestic trouble/relationship loss Job loss & financial problems Shortened life span Accidents and death from drunk driving   To the Face: Premature aging, drinker's nose Cancer of the throat & mouth To the Body: Frequent cold Reduced resistance to infection Increased risk of pneumonia Weakness of heart muscle Heart failure, anemia Impaired blood clotting Breast cancer Vitamin deficiency, bleeding Severe Inflammation of the stomach Vomiting, diarrhea, malnutrition Ulcer, inflammation of the pancreas Impaired sexual performance Birth defects, including deformities, retardation, and low birthweight   Ways to Cope Without Drinking Go home if you tend to drink after  work Find another activity Switch to nonalcoholic beverages Change friends Join a Financial planner Visit relatives Plan/take a trip Go for a walk Take up a hobby Listen to music Talk to a friend Reading What would you do if you had no worries about failing?         Good Reasons for Drinking Less I will live longer - probably 8-10 years. I will sleep better. I will be happier. I will save a lot of money My relationships will improve. I will stay younger for longer. I will achieve more in my life There will be a greater chance that I will survive to a healthy old age with no premature damage to my brain.  I will be better at my job. I will be less likely to feel depressed and commit suicide (6 times less likely). I will be less likely to die of heart disease or cancer. Other people will respect me I will be less likely to get into trouble with  the police. The possibility that I will die of liver disease will be dramatically reduced (12 times less likely). It will be less likely that I will die in a car accident (3 times less likely).   Strategies for Cutting Down Keep Track.  Find a way to keep track of how much you drink.  If you make a note of each drink before you drink it, this will help slow you down. Count and Measure.  Know the standard drink sizes.  Ask the bartender or server about the amount of alcohol in a mixed drink. Set Goals.  Decide how many days a week you will drink and how many drinks each day. Pace and Space.  When you do drink, pace yourself.  Have no more than one drink with alcohol per hour.  Alternate "drink spacers" non-alcoholic drinks such as water, soda, or juice with drinks containing alcohol. Include Food.  Don't drink on an empty stomach.  Have some food so the alcohol will be absorbed more slowly into your system.  Avoid Triggers.  Avoid people, places, or activities that have led to drinking in the past.  Certain times of day or feelings may  also be triggers.  Make a plan so you will know what you can do instead of drinking. Plan to Handle Urges.  When an urge hits, consider these options:  Remind yourself of your reasons for changing.  Or talk it through with someone you trust. Or get involved with a healthy, distracting activity.  Or, "urge surf" - instead of fighting the feeling, accept it and ride it out, knowing it will soon crest like a wave and pass. Know Your "No".  Have a polite, convincing "no thanks" for those times when you may be offered a drink and don't want one.  The faster you can say no to these offers, the less likely you are to give in.  If you hesitate, it allows you time to think of excuses to go along.

## 2021-08-31 ENCOUNTER — Other Ambulatory Visit: Payer: Self-pay

## 2021-08-31 LAB — CBC
Hematocrit: 32.3 % — ABNORMAL LOW (ref 34.0–46.6)
Hemoglobin: 9.9 g/dL — ABNORMAL LOW (ref 11.1–15.9)
MCH: 21.9 pg — ABNORMAL LOW (ref 26.6–33.0)
MCHC: 30.7 g/dL — ABNORMAL LOW (ref 31.5–35.7)
MCV: 72 fL — ABNORMAL LOW (ref 79–97)
Platelets: 305 10*3/uL (ref 150–450)
RBC: 4.52 x10E6/uL (ref 3.77–5.28)
RDW: 19.4 % — ABNORMAL HIGH (ref 11.7–15.4)
WBC: 4.8 10*3/uL (ref 3.4–10.8)

## 2021-08-31 LAB — IRON,TIBC AND FERRITIN PANEL
Ferritin: 8 ng/mL — ABNORMAL LOW (ref 15–150)
Iron Saturation: 4 % — CL (ref 15–55)
Iron: 16 ug/dL — ABNORMAL LOW (ref 27–139)
Total Iron Binding Capacity: 417 ug/dL (ref 250–450)
UIBC: 401 ug/dL — ABNORMAL HIGH (ref 118–369)

## 2021-08-31 MED ORDER — FUSION PLUS PO CAPS
ORAL_CAPSULE | ORAL | 3 refills | Status: DC
Start: 2021-08-31 — End: 2021-11-27

## 2021-09-01 LAB — SPECIMEN STATUS REPORT

## 2021-09-01 LAB — VITAMIN B12: Vitamin B-12: 397 pg/mL (ref 232–1245)

## 2021-09-10 ENCOUNTER — Other Ambulatory Visit: Payer: Self-pay | Admitting: Internal Medicine

## 2021-09-13 ENCOUNTER — Other Ambulatory Visit (INDEPENDENT_AMBULATORY_CARE_PROVIDER_SITE_OTHER): Payer: Medicare Other

## 2021-09-13 DIAGNOSIS — D508 Other iron deficiency anemias: Secondary | ICD-10-CM | POA: Diagnosis not present

## 2021-09-13 LAB — HEMOCCULT GUIAC POC 1CARD (OFFICE)
Card #2 Fecal Occult Blod, POC: POSITIVE
Card #3 Fecal Occult Blood, POC: POSITIVE
Fecal Occult Blood, POC: POSITIVE — AB

## 2021-09-15 ENCOUNTER — Other Ambulatory Visit: Payer: Self-pay

## 2021-09-15 DIAGNOSIS — R195 Other fecal abnormalities: Secondary | ICD-10-CM

## 2021-09-16 NOTE — Addendum Note (Signed)
Addended by: Blanca Friend on: 09/16/2021 09:45 AM   Modules accepted: Orders

## 2021-09-17 ENCOUNTER — Ambulatory Visit (HOSPITAL_COMMUNITY): Payer: Medicare Other | Attending: Internal Medicine

## 2021-09-17 ENCOUNTER — Other Ambulatory Visit: Payer: Self-pay

## 2021-09-17 DIAGNOSIS — R0602 Shortness of breath: Secondary | ICD-10-CM | POA: Diagnosis not present

## 2021-09-17 LAB — ECHOCARDIOGRAM COMPLETE
Area-P 1/2: 3.1 cm2
S' Lateral: 2.4 cm

## 2021-10-05 DIAGNOSIS — D509 Iron deficiency anemia, unspecified: Secondary | ICD-10-CM | POA: Diagnosis not present

## 2021-10-05 DIAGNOSIS — Z1211 Encounter for screening for malignant neoplasm of colon: Secondary | ICD-10-CM | POA: Diagnosis not present

## 2021-10-05 DIAGNOSIS — K5730 Diverticulosis of large intestine without perforation or abscess without bleeding: Secondary | ICD-10-CM | POA: Diagnosis not present

## 2021-10-05 DIAGNOSIS — K921 Melena: Secondary | ICD-10-CM | POA: Diagnosis not present

## 2021-10-05 DIAGNOSIS — K219 Gastro-esophageal reflux disease without esophagitis: Secondary | ICD-10-CM | POA: Diagnosis not present

## 2021-10-07 DIAGNOSIS — D509 Iron deficiency anemia, unspecified: Secondary | ICD-10-CM | POA: Diagnosis not present

## 2021-10-07 DIAGNOSIS — K921 Melena: Secondary | ICD-10-CM | POA: Diagnosis not present

## 2021-10-07 DIAGNOSIS — K6389 Other specified diseases of intestine: Secondary | ICD-10-CM | POA: Diagnosis not present

## 2021-10-07 DIAGNOSIS — K5730 Diverticulosis of large intestine without perforation or abscess without bleeding: Secondary | ICD-10-CM | POA: Diagnosis not present

## 2021-10-07 DIAGNOSIS — K449 Diaphragmatic hernia without obstruction or gangrene: Secondary | ICD-10-CM | POA: Diagnosis not present

## 2021-10-07 DIAGNOSIS — K31819 Angiodysplasia of stomach and duodenum without bleeding: Secondary | ICD-10-CM | POA: Diagnosis not present

## 2021-10-07 DIAGNOSIS — K5520 Angiodysplasia of colon without hemorrhage: Secondary | ICD-10-CM | POA: Diagnosis not present

## 2021-10-07 LAB — HM COLONOSCOPY

## 2021-10-08 ENCOUNTER — Encounter: Payer: Self-pay | Admitting: Internal Medicine

## 2021-10-08 DIAGNOSIS — F411 Generalized anxiety disorder: Secondary | ICD-10-CM | POA: Diagnosis not present

## 2021-10-12 DIAGNOSIS — F411 Generalized anxiety disorder: Secondary | ICD-10-CM | POA: Diagnosis not present

## 2021-10-12 DIAGNOSIS — F331 Major depressive disorder, recurrent, moderate: Secondary | ICD-10-CM | POA: Diagnosis not present

## 2021-10-13 ENCOUNTER — Other Ambulatory Visit: Payer: Self-pay | Admitting: Gastroenterology

## 2021-11-01 NOTE — Progress Notes (Incomplete)
Cardiology Office Note:    Date:  11/01/2021   ID:  Maureen Rodgers, DOB [birth year], MRN [id number]  PCP:  Glendale Chard, MD  Cardiologist:  None    Referring MD: Glendale Chard, MD   No chief complaint on file.   History of Present Illness:    Maureen Rodgers is a 70 y.o. female with a hx of ***  Past Medical History:  Diagnosis Date   Depression    GERD (gastroesophageal reflux disease)    Tobacco use     Past Surgical History:  Procedure Laterality Date   ABDOMINAL HYSTERECTOMY     BILATERAL CARPAL TUNNEL RELEASE  2018   2018 on Right, many years ago on left   EYE SURGERY     INCISION AND DRAINAGE PERIRECTAL ABSCESS N/A 02/27/2020   Procedure: IRRIGATION AND DEBRIDEMENT PERIRECTAL ABSCESS;  Surgeon: Donnie Mesa, MD;  Location: [facility];  Service: General;  Laterality: N/A;   IRRIGATION AND DEBRIDEMENT ABSCESS  02/27/2020   Procedure(s) Performed: IRRIGATION AND DEBRIDEMENT PERIRECTAL ABSCESS (N/A Perineum)    Current Medications: No outpatient medications have been marked as taking for the 11/03/21 encounter (Appointment) with Skeet Latch, MD.     Allergies:   Patient has no known allergies.   Social History   Socioeconomic History   Marital status: Single    Spouse name: Not on file   Number of children: Not on file   Years of education: Not on file   Highest education level: Not on file  Occupational History   Occupation: retired  Tobacco Use   Smoking status: Every Day    Packs/day: 0.75    Years: 44.00    Pack years: 33.00    Types: Cigarettes    Start date: 10/03/1974   Smokeless tobacco: Never   Tobacco comments:    has been smoking 1ppd x 18 months, in addition to above history. Right now, she is not ready to quit. Currently in therapy.   Vaping Use   Vaping Use: Never used  Substance and Sexual Activity   Alcohol use: Yes    Alcohol/week: 2.0 standard drinks    Types: 2 Cans of beer per week    Comment: 3 beers a day   Drug use: No    Sexual activity: Not Currently  Other Topics Concern   Not on file  Social History Narrative   Not on file   Social Determinants of Health   Financial Resource Strain: Low Risk    Difficulty of Paying Living Expenses: Not hard at all  Food Insecurity: No Food Insecurity   Worried About Charity fundraiser in the Last Year: Never true   [place] in the Last Year: Never true  Transportation Needs: No Transportation Needs   Lack of Transportation (Medical): No   Lack of Transportation (Non-Medical): No  Physical Activity: Inactive   Days of Exercise per Week: 0 days   Minutes of Exercise per Session: 0 min  Stress: No Stress Concern Present   Feeling of Stress : Not at all  Social Connections: Not on file     Family History: The patient's ***family history includes Alcohol abuse in her father and mother; Dementia in her mother; Diabetes in her brother; Heart disease in her mother.  ROS:   Please see the history of present illness.    ROS  All other systems reviewed and negative.   EKGs/Labs/Other Studies Reviewed:    The following studies were reviewed today: ***  EKG:  EKG is *** ordered today.  The ekg ordered today demonstrates ***  Recent Labs: 02/24/2021: ALT 9; BUN 5; Creatinine, Ser 0.97; Magnesium 2.4; Potassium 4.6; Sodium 139; TSH 1.890 08/30/2021: Hemoglobin 9.9; Platelets 305   Recent Lipid Panel    Component Value Date/Time   CHOL 157 02/24/2021 1623   TRIG 59 02/24/2021 1623   HDL 88 02/24/2021 1623   CHOLHDL 1.8 02/24/2021 1623   LDLCALC 57 02/24/2021 1623    CHA2DS2-VASc Score =   [ ] .  Therefore, the patient's annual risk of stroke is   %.        Physical Exam:    VS:  There were no vitals taken for this visit.    Wt Readings from Last 3 Encounters:  08/30/21 146 lb 12.8 oz (66.6 kg)  03/09/21 145 lb (65.8 kg)  02/24/21 147 lb (66.7 kg)     GEN: *** Well nourished, well developed in no acute distress HEENT: Normal NECK: No  JVD; No carotid bruits LYMPHATICS: No lymphadenopathy CARDIAC: ***RRR, no murmurs, rubs, gallops RESPIRATORY:  Clear to auscultation without rales, wheezing or rhonchi  ABDOMEN: Soft, non-tender, non-distended MUSCULOSKELETAL:  No edema; No deformity  SKIN: Warm and dry NEUROLOGIC:  Alert and oriented x 3 PSYCHIATRIC:  Normal affect   ASSESSMENT:    No diagnosis found. PLAN:    In order of problems listed above:  .[facility]   Time Spent: *** minutes total time of encounter, including *** minutes spent in face-to-face patient care on the date of this encounter. This time includes coordination of care and counseling regarding above mentioned problem list. Remainder of non-face-to-face time involved reviewing chart documents/testing relevant to the patient encounter and documentation in the medical record. I have independently reviewed documentation from referring provider  Medication Adjustments/Labs and Tests Ordered: Current medicines are reviewed at length with the patient today.  Concerns regarding medicines are outlined above.  No orders of the defined types were placed in this encounter.  No orders of the defined types were placed in this encounter.   Mosie Lukes  11/01/2021 9:27 AM    [facility]

## 2021-11-03 ENCOUNTER — Ambulatory Visit (HOSPITAL_BASED_OUTPATIENT_CLINIC_OR_DEPARTMENT_OTHER): Payer: Medicare Other | Admitting: Cardiovascular Disease

## 2021-11-12 DIAGNOSIS — F411 Generalized anxiety disorder: Secondary | ICD-10-CM | POA: Diagnosis not present

## 2021-11-18 ENCOUNTER — Encounter (HOSPITAL_COMMUNITY): Payer: Self-pay | Admitting: Gastroenterology

## 2021-11-18 NOTE — Progress Notes (Signed)
Attempted to obtain medical history via telephone, unable to reach at this time. I left a voicemail to return pre surgical testing department's phone call.  

## 2021-11-26 ENCOUNTER — Ambulatory Visit (HOSPITAL_COMMUNITY): Payer: Medicare Other | Admitting: Anesthesiology

## 2021-11-26 ENCOUNTER — Encounter (HOSPITAL_COMMUNITY): Payer: Self-pay | Admitting: Gastroenterology

## 2021-11-26 ENCOUNTER — Ambulatory Visit (HOSPITAL_BASED_OUTPATIENT_CLINIC_OR_DEPARTMENT_OTHER): Payer: Medicare Other | Admitting: Anesthesiology

## 2021-11-26 ENCOUNTER — Ambulatory Visit (HOSPITAL_COMMUNITY)
Admission: RE | Admit: 2021-11-26 | Discharge: 2021-11-26 | Disposition: A | Discharge status: Home / Self Care | Payer: Medicare Other | Attending: Gastroenterology | Admitting: Gastroenterology

## 2021-11-26 ENCOUNTER — Encounter (HOSPITAL_COMMUNITY)
Admission: RE | Disposition: A | Discharge status: Home / Self Care | Payer: Self-pay | Source: Home / Self Care | Attending: Gastroenterology

## 2021-11-26 ENCOUNTER — Other Ambulatory Visit: Payer: Self-pay | Admitting: Internal Medicine

## 2021-11-26 ENCOUNTER — Other Ambulatory Visit: Payer: Self-pay

## 2021-11-26 DIAGNOSIS — K2980 Duodenitis without bleeding: Secondary | ICD-10-CM | POA: Insufficient documentation

## 2021-11-26 DIAGNOSIS — K449 Diaphragmatic hernia without obstruction or gangrene: Secondary | ICD-10-CM | POA: Insufficient documentation

## 2021-11-26 DIAGNOSIS — K31819 Angiodysplasia of stomach and duodenum without bleeding: Secondary | ICD-10-CM

## 2021-11-26 DIAGNOSIS — K3189 Other diseases of stomach and duodenum: Secondary | ICD-10-CM | POA: Diagnosis not present

## 2021-11-26 DIAGNOSIS — K5520 Angiodysplasia of colon without hemorrhage: Secondary | ICD-10-CM | POA: Insufficient documentation

## 2021-11-26 DIAGNOSIS — K5730 Diverticulosis of large intestine without perforation or abscess without bleeding: Secondary | ICD-10-CM | POA: Diagnosis not present

## 2021-11-26 DIAGNOSIS — D124 Benign neoplasm of descending colon: Secondary | ICD-10-CM | POA: Diagnosis not present

## 2021-11-26 DIAGNOSIS — K635 Polyp of colon: Secondary | ICD-10-CM | POA: Diagnosis not present

## 2021-11-26 DIAGNOSIS — F17210 Nicotine dependence, cigarettes, uncomplicated: Secondary | ICD-10-CM | POA: Diagnosis not present

## 2021-11-26 DIAGNOSIS — D509 Iron deficiency anemia, unspecified: Secondary | ICD-10-CM | POA: Diagnosis not present

## 2021-11-26 HISTORY — PX: ESOPHAGOGASTRODUODENOSCOPY (EGD) WITH PROPOFOL: SHX5813

## 2021-11-26 HISTORY — PX: HOT HEMOSTASIS: SHX5433

## 2021-11-26 HISTORY — PX: HEMOSTASIS CLIP PLACEMENT: SHX6857

## 2021-11-26 HISTORY — PX: POLYPECTOMY: SHX5525

## 2021-11-26 HISTORY — PX: COLONOSCOPY WITH PROPOFOL: SHX5780

## 2021-11-26 SURGERY — ESOPHAGOGASTRODUODENOSCOPY (EGD) WITH PROPOFOL
Anesthesia: Monitor Anesthesia Care

## 2021-11-26 MED ORDER — PROPOFOL 10 MG/ML IV BOLUS
INTRAVENOUS | Status: DC | PRN
  Administered 2021-11-26 (×5): 20 mg via INTRAVENOUS

## 2021-11-26 MED ORDER — LIDOCAINE 2% (20 MG/ML) 5 ML SYRINGE
INTRAMUSCULAR | Status: DC | PRN
Start: 2021-11-26 — End: 2021-11-26
  Administered 2021-11-26: 100 mg via INTRAVENOUS

## 2021-11-26 MED ORDER — LABETALOL HCL 5 MG/ML IV SOLN
INTRAVENOUS | Status: DC | PRN
  Administered 2021-11-26 (×2): 5 mg via INTRAVENOUS

## 2021-11-26 MED ORDER — PROPOFOL 500 MG/50ML IV EMUL
INTRAVENOUS | Status: AC
  Filled 2021-11-26: qty 50

## 2021-11-26 MED ORDER — SODIUM CHLORIDE 0.9 % IV SOLN: INTRAVENOUS | Status: DC

## 2021-11-26 MED ORDER — PROPOFOL 500 MG/50ML IV EMUL
INTRAVENOUS | Status: DC | PRN
Start: 2021-11-26 — End: 2021-11-26
  Administered 2021-11-26: 125 ug/kg/min via INTRAVENOUS

## 2021-11-26 MED ORDER — LACTATED RINGERS IV SOLN
INTRAVENOUS | Status: AC | PRN
  Administered 2021-11-26: 10 mL/h via INTRAVENOUS

## 2021-11-26 MED ORDER — PROPOFOL 10 MG/ML IV BOLUS
INTRAVENOUS | Status: AC
  Filled 2021-11-26: qty 20

## 2021-11-26 SURGICAL SUPPLY — 25 items

## 2021-11-26 NOTE — Discharge Instructions (Signed)

## 2021-11-26 NOTE — H&P (Signed)
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  She is here for treatment of the AVMs.  Past Medical History:  Diagnosis Date   Depression    GERD (gastroesophageal reflux disease)    Tobacco use     Past Surgical History:  Procedure Laterality Date   ABDOMINAL HYSTERECTOMY     BILATERAL CARPAL TUNNEL RELEASE  2018   2018 on Right, many years ago on left   EYE SURGERY     INCISION AND DRAINAGE PERIRECTAL ABSCESS N/A 02/27/2020   Procedure: IRRIGATION AND DEBRIDEMENT PERIRECTAL ABSCESS;  Surgeon: Donnie Mesa, MD;  Location: [facility];  Service: General;  Laterality: N/A;   IRRIGATION AND DEBRIDEMENT ABSCESS  02/27/2020   Procedure(s) Performed: IRRIGATION AND DEBRIDEMENT PERIRECTAL ABSCESS (N/A Perineum)    Family History  Problem Relation Age of Onset   Heart disease Mother    Dementia Mother    Alcohol abuse Mother    Alcohol abuse Father    Diabetes Brother     Social History:  reports that she has been smoking cigarettes. She started smoking about 47 years ago. She has a 33.00 pack-year smoking history. She has never used smokeless tobacco. She reports current alcohol use of about 2.0 standard drinks per week. She reports that she does not use drugs.  Allergies: No Known Allergies  Medications: Scheduled: Continuous:  No results found for this or any previous visit (from the past 24 hour(s)).   No results found.  ROS:  As stated above in the HPI otherwise negative.  There were no vitals taken for this visit.    PE: Gen: NAD, Alert and Oriented HEENT:  [place]/AT, EOMI Neck: Supple, no LAD Lungs: CTA Bilaterally CV: RRR without M/G/R ABD: Soft, NTND, +BS Ext: No C/C/E  Assessment/Plan: 1) IDA 2) AVMs - EGD/colonoscopy with APC.  Wynston Romey D 11/26/2021, 9:22 AM

## 2021-11-26 NOTE — Anesthesia Postprocedure Evaluation (Signed)
Anesthesia Post Note  Patient: Maureen Rodgers  Procedure(s) Performed: ESOPHAGOGASTRODUODENOSCOPY (EGD) WITH PROPOFOL COLONOSCOPY WITH PROPOFOL HOT HEMOSTASIS (ARGON PLASMA COAGULATION/BICAP) HEMOSTASIS CLIP PLACEMENT     Patient location during evaluation: PACU Anesthesia Type: MAC Level of consciousness: awake and alert Pain management: pain level controlled Vital Signs Assessment: post-procedure vital signs reviewed and stable Respiratory status: spontaneous breathing Cardiovascular status: stable Anesthetic complications: no   No notable events documented.  Last Vitals:  Vitals:   11/26/21 1103 11/26/21 1113  BP: (!) 146/79 (!) 157/78  Pulse: (!) 41 (!) 45  Resp: 17 15  Temp:    SpO2: 96% 95%    Last Pain:  Vitals:   11/26/21 1113  TempSrc:   PainSc: 2                  Nolon Nations

## 2021-11-26 NOTE — Op Note (Signed)
[facility] Patient Name: Maureen Rodgers Procedure Date: 11/26/2021 MRN: [id number] Attending MD: Carol Ada , MD Date of Birth: [birth year] CSN: [id number] Age: 71 Admit Type: Outpatient Procedure:                Upper GI endoscopy Indications:              Iron deficiency anemia Providers:                Carol Ada, MD, Dulcy Fanny, Cletis Athens,                            Technician, Danley Danker, CRNA Referring MD:              Medicines:                Propofol per Anesthesia Complications:            No immediate complications. Estimated Blood Loss:     Estimated blood loss: none. Procedure:                Pre-Anesthesia Assessment:                           - Prior to the procedure, a History and Physical                            was performed, and patient medications and                            allergies were reviewed. The patient's tolerance of                            previous anesthesia was also reviewed. The risks                            and benefits of the procedure and the sedation                            options and risks were discussed with the patient.                            All questions were answered, and informed consent                            was obtained. Prior Anticoagulants: The patient has                            taken no previous anticoagulant or antiplatelet                            agents. ASA Grade Assessment: II - A patient with                            mild systemic disease. After reviewing the risks  and benefits, the patient was deemed in                            satisfactory condition to undergo the procedure.                           - Sedation was administered by an anesthesia                            professional. Deep sedation was attained.                           After obtaining informed consent, the endoscope was                            passed under direct  vision. Throughout the                            procedure, the patient's blood pressure, pulse, and                            oxygen saturations were monitored continuously. The                            PCF-HQ190L ([id number]) Olympus colonoscope was                            introduced through the mouth, and advanced to the                            second part of duodenum. The upper GI endoscopy was                            accomplished without difficulty. The patient                            tolerated the procedure well. Scope In: 10:18:19 AM Scope Out: 10:33:22 AM Total Procedure Duration: 0 hours 15 minutes 3 seconds  Findings:      A 4 cm hiatal hernia was present.      The gastroesophageal flap valve was visualized endoscopically and       classified as Hill Grade III (minimal fold, loose to endoscope, hiatal       hernia likely).      Multiple dispersed small erosions with no stigmata of recent bleeding       were found in the gastric antrum.      Two small angiodysplastic lesions without bleeding were found in the       second portion of the duodenum. Coagulation for tissue destruction using       monopolar probe was successful. To stop active bleeding, three       hemostatic clips were successfully placed. There was no bleeding at the       end of the procedure.      Diffuse moderate inflammation characterized by erythema was found in the       duodenal bulb and in the  second portion of the duodenum.      With this examination there was a definite change in the mucosa of the       stomach and duodenum. Antral erosions and a duodenitis was identified.       Cold biopsies were not obtained as this change occurred over the past       month which may be more suggestive of a reactive gastropathy from       medications. Two AVMs were identified and treated with APC. Hemoclips       for one of the AVMs was required to completely arrest the bleeding. Impression:                - 4 cm hiatal hernia.                           - Gastroesophageal flap valve classified as Hill                            Grade III (minimal fold, loose to endoscope, hiatal                            hernia likely).                           - Erosive gastropathy with no stigmata of recent                            bleeding.                           - Two non-bleeding angiodysplastic lesions in the                            duodenum. Treated with a monopolar probe. Clips                            were placed.                           - Duodenitis.                           - No specimens collected. Moderate Sedation:      Not Applicable - Patient had care per Anesthesia. Recommendation:           - Patient has a contact number available for                            emergencies. The signs and symptoms of potential                            delayed complications were discussed with the                            patient. Return to normal activities tomorrow.  Written discharge instructions were provided to the                            patient.                           - Resume previous diet.                           - Continue present medications.                           - PPI QD.                           - Follow up with Dr. Collene Mares in two months. Procedure Code(s):        --- Professional ---                           [phone], Esophagogastroduodenoscopy, flexible,                            transoral; with ablation of tumor(s), polyp(s), or                            other lesion(s) (includes pre- and post-dilation                            and guide wire passage, when performed)                           43255, 98, Esophagogastroduodenoscopy, flexible,                            transoral; with control of bleeding, any method Diagnosis Code(s):        --- Professional ---                           K31.819, Angiodysplasia of stomach and duodenum                             without bleeding                           K44.9, Diaphragmatic hernia without obstruction or                            gangrene                           K31.89, Other diseases of stomach and duodenum                           K29.80, Duodenitis without bleeding                           D50.9, Iron deficiency anemia, unspecified CPT copyright 2019 [employer]. All rights  reserved. The codes documented in this report are preliminary and upon coder review may  be revised to meet current compliance requirements. Carol Ada, MD Carol Ada, MD 11/26/2021 10:48:14 AM This report has been signed electronically. Number of Addenda: 0

## 2021-11-26 NOTE — Transfer of Care (Signed)
Immediate Anesthesia Transfer of Care Note  Patient: LEDONNA DORMER  Procedure(s) Performed: ESOPHAGOGASTRODUODENOSCOPY (EGD) WITH PROPOFOL COLONOSCOPY WITH PROPOFOL HOT HEMOSTASIS (ARGON PLASMA COAGULATION/BICAP) HEMOSTASIS CLIP PLACEMENT  Patient Location: Endoscopy Unit  Anesthesia Type:MAC  Level of Consciousness: awake  Airway & Oxygen Therapy: Patient Spontanous Breathing and Patient connected to face mask oxygen  Post-op Assessment: Report given to RN and Post -op Vital signs reviewed and stable  Post vital signs: Reviewed and stable  Last Vitals:  Vitals Value Taken Time  BP    Temp    Pulse    Resp 17 11/26/21 1043  SpO2    Vitals shown include unvalidated device data.  Last Pain:  Vitals:   11/26/21 0929  TempSrc: Temporal  PainSc: 0-No pain         Complications: No notable events documented.

## 2021-11-26 NOTE — Op Note (Signed)
[facility] Patient Name: Maureen Rodgers Procedure Date: 11/26/2021 MRN: [id number] Attending MD: Carol Ada , MD Date of Birth: April 22, 1951 CSN: [id number] Age: 71 Admit Type: Outpatient Procedure:                Colonoscopy Indications:              Iron deficiency anemia Providers:                Carol Ada, MD, Dulcy Fanny, Cletis [place],                            Technician, Danley Danker, CRNA Referring MD:              Medicines:                Propofol per Anesthesia Complications:            No immediate complications. Estimated Blood Loss:     Estimated blood loss: none. Procedure:                Pre-Anesthesia Assessment:                           - Prior to the procedure, a History and Physical                            was performed, and patient medications and                            allergies were reviewed. The patient's tolerance of                            previous anesthesia was also reviewed. The risks                            and benefits of the procedure and the sedation                            options and risks were discussed with the patient.                            All questions were answered, and informed consent                            was obtained. Prior Anticoagulants: The patient has                            taken no previous anticoagulant or antiplatelet                            agents. ASA Grade Assessment: II - A patient with                            mild systemic disease. After reviewing the risks  and benefits, the patient was deemed in                            satisfactory condition to undergo the procedure.                           - Sedation was administered by an anesthesia                            professional. Deep sedation was attained.                           After obtaining informed consent, the colonoscope                            was passed under direct  vision. Throughout the                            procedure, the patient's blood pressure, pulse, and                            oxygen saturations were monitored continuously. The                            PCF-HQ190L ([id number]) Olympus colonoscope was                            introduced through the anus and advanced to the the                            cecum, identified by appendiceal orifice and                            ileocecal valve. The colonoscopy was performed                            without difficulty. The patient tolerated the                            procedure well. The quality of the bowel                            preparation was evaluated using the BBPS [facility]                            Bowel Preparation Scale) with scores of: Right                            Colon = 3 (entire mucosa seen well with no residual                            staining, small fragments of stool or opaque  liquid), Transverse Colon = 3 (entire mucosa seen                            well with no residual staining, small fragments of                            stool or opaque liquid) and Left Colon = 3 (entire                            mucosa seen well with no residual staining, small                            fragments of stool or opaque liquid). The total                            BBPS score equals 9. The quality of the bowel                            preparation was good. The ileocecal valve,                            appendiceal orifice, and rectum were photographed. Scope In: Scope Out: Findings:      A 3 mm polyp was found in the descending colon. The polyp was sessile.       The polyp was removed with a cold snare. Resection and retrieval were       complete.      A single medium-sized localized angiodysplastic lesion without bleeding       was found in the ascending colon. Coagulation for tissue destruction       using monopolar probe was  successful.      Scattered small and large-mouthed diverticula were found in the sigmoid       colon. Impression:               - One 3 mm polyp in the descending colon, removed                            with a cold snare. Resected and retrieved.                           - A single non-bleeding colonic angiodysplastic                            lesion. Treated with a monopolar probe.                           - Diverticulosis in the sigmoid colon. Moderate Sedation:      Not Applicable - Patient had care per Anesthesia. Recommendation:           - Patient has a contact number available for                            emergencies. The signs and symptoms of potential  delayed complications were discussed with the                            patient. Return to normal activities tomorrow.                            Written discharge instructions were provided to the                            patient.                           - Resume previous diet.                           - Continue present medications.                           - Await pathology results.                           - Repeat colonoscopy is not recommended due to                            current age (65 years or older) for surveillance.                           - Follow up with Dr. Collene Mares in 2 months. Procedure Code(s):        --- Professional ---                           [id number], Colonoscopy, flexible; with ablation of                            tumor(s), polyp(s), or other lesion(s) (includes                            pre- and post-dilation and guide wire passage, when                            performed)                           45385, 59, Colonoscopy, flexible; with removal of                            tumor(s), polyp(s), or other lesion(s) by snare                            technique Diagnosis Code(s):        --- Professional ---                           K63.5, Polyp of colon                            K55.20,  Angiodysplasia of colon without hemorrhage                           D50.9, Iron deficiency anemia, unspecified                           K57.30, Diverticulosis of large intestine without                            perforation or abscess without bleeding CPT copyright 2019 American Medical Association. All rights reserved. The codes documented in this report are preliminary and upon coder review may  be revised to meet current compliance requirements. Carol Ada, MD Carol Ada, MD 11/26/2021 10:41:14 AM This report has been signed electronically. Number of Addenda: 0

## 2021-11-26 NOTE — Anesthesia Preprocedure Evaluation (Addendum)
Anesthesia Evaluation  Patient identified by MRN, date of birth, ID band Patient awake    Reviewed: Allergy & Precautions, NPO status , Patient's Chart, lab work & pertinent test results  Airway Mallampati: II  TM Distance: >3 FB Neck ROM: Full    Dental  (+) Dental Advisory Given   Pulmonary shortness of breath, Current Smoker and Patient abstained from smoking.,    Pulmonary exam normal breath sounds clear to auscultation       Cardiovascular negative cardio ROS Normal cardiovascular exam Rhythm:Regular Rate:Normal     Neuro/Psych PSYCHIATRIC DISORDERS Depression negative neurological ROS     GI/Hepatic Neg liver ROS, GERD  ,  Endo/Other  negative endocrine ROS  Renal/GU negative Renal ROS     Musculoskeletal negative musculoskeletal ROS (+)   Abdominal   Peds  Hematology negative hematology ROS (+)   Anesthesia Other Findings   Reproductive/Obstetrics                           Anesthesia Physical Anesthesia Plan  ASA: 2  Anesthesia Plan: MAC   Post-op Pain Management: Minimal or no pain anticipated   Induction: Intravenous  PONV Risk Score and Plan: 1 and Propofol infusion, TIVA and Treatment may vary due to age or medical condition  Airway Management Planned: Natural Airway  Additional Equipment:   Intra-op Plan:   Post-operative Plan:   Informed Consent: I have reviewed the patients History and Physical, chart, labs and discussed the procedure including the risks, benefits and alternatives for the proposed anesthesia with the patient or authorized representative who has indicated his/her understanding and acceptance.     Dental advisory given  Plan Discussed with: CRNA  Anesthesia Plan Comments:         Anesthesia Quick Evaluation

## 2021-11-29 ENCOUNTER — Other Ambulatory Visit: Payer: Self-pay

## 2021-11-29 ENCOUNTER — Ambulatory Visit (INDEPENDENT_AMBULATORY_CARE_PROVIDER_SITE_OTHER): Payer: Medicare Other | Admitting: Cardiovascular Disease

## 2021-11-29 ENCOUNTER — Encounter (HOSPITAL_BASED_OUTPATIENT_CLINIC_OR_DEPARTMENT_OTHER): Payer: Self-pay | Admitting: Cardiovascular Disease

## 2021-11-29 VITALS — BP 144/92 | HR 86 | Ht 62.0 in | Wt 139.8 lb

## 2021-11-29 DIAGNOSIS — I10 Essential (primary) hypertension: Secondary | ICD-10-CM | POA: Insufficient documentation

## 2021-11-29 DIAGNOSIS — I7121 Aneurysm of the ascending aorta, without rupture: Secondary | ICD-10-CM

## 2021-11-29 DIAGNOSIS — I700 Atherosclerosis of aorta: Secondary | ICD-10-CM

## 2021-11-29 DIAGNOSIS — F331 Major depressive disorder, recurrent, moderate: Secondary | ICD-10-CM | POA: Diagnosis not present

## 2021-11-29 DIAGNOSIS — F17210 Nicotine dependence, cigarettes, uncomplicated: Secondary | ICD-10-CM

## 2021-11-29 DIAGNOSIS — R0609 Other forms of dyspnea: Secondary | ICD-10-CM | POA: Diagnosis not present

## 2021-11-29 HISTORY — DX: Essential (primary) hypertension: I10

## 2021-11-29 HISTORY — DX: Aneurysm of the ascending aorta, without rupture: I71.21

## 2021-11-29 LAB — SURGICAL PATHOLOGY

## 2021-11-29 NOTE — Assessment & Plan Note (Signed)
Blood pressure was elevated in the office today.  She attributes this to whitecoat syndrome and notes that it is usually better on repeat.  It did improve somewhat but still was above goal, especially given her aortic aneurysm.  She is going to get blood pressure cuff and track her blood pressures at home.  She will bring to follow-up.  We will also enroll her in the PR EP program to the [facility].

## 2021-11-29 NOTE — Assessment & Plan Note (Signed)
Ongoing tobacco use.  We will have our care guide reach out to her for smoking cessation assistance.

## 2021-11-29 NOTE — Progress Notes (Signed)
Cardiology Office Note   Date:  11/29/2021   ID:  Chau, Savell 1951/02/07, MRN [id number]  PCP:  Glendale Chard, MD  Cardiologist:   Skeet Latch, MD   No chief complaint on file.     History of Present Illness: Maureen Rodgers is a 71 y.o. female with aortic atherosclerosis, iron deficiency anemia, GERD, hyperlipidemia, and tobacco abuse who is being seen today for the evaluation of shortness of breath at the request of Glendale Chard, MD. she saw Dr. Baird Cancer 08/2021 and reported shortness of breath.  It was noted that anemia may have been contributing.  She had an echo 12/22 that revealed LVEF 60 to 65% with mild LVH and grade 1 diastolic dysfunction.  She had mild posterior mitral valve leaflet prolapse with no regurgitation.  She has been strugglign with depression.  She has been in therapy but feels constantly tired.  She doesn't get much exercise.  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  She doesn't get any exercise.  She gets short of breath with exertion.  She smokes at least 0.5 ppd and started at age 53.  She also reports drinking 3 large beers daily.  She reports a history of alcoholism and was previously able to stop drinking for 6 years.  She denies any chest pain.  She has no lower extremity edema, orthopnea, or PND.  She denies any claudication.  She notes that she is not eating much due to poor appetite.   Past Medical History:  Diagnosis Date   Ascending aortic aneurysm 11/29/2021   Depression    Essential hypertension 11/29/2021   GERD (gastroesophageal reflux disease)    Tobacco use     Past Surgical History:  Procedure Laterality Date   ABDOMINAL HYSTERECTOMY     BILATERAL CARPAL TUNNEL RELEASE  2018   2018 on Right, many years ago on left   COLONOSCOPY WITH PROPOFOL N/A 11/26/2021   Procedure: COLONOSCOPY WITH PROPOFOL;  Surgeon: Carol Ada, MD;  Location: [facility];  Service: Endoscopy;   Laterality: N/A;   ESOPHAGOGASTRODUODENOSCOPY (EGD) WITH PROPOFOL N/A 11/26/2021   Procedure: ESOPHAGOGASTRODUODENOSCOPY (EGD) WITH PROPOFOL;  Surgeon: Carol Ada, MD;  Location: [facility];  Service: Endoscopy;  Laterality: N/A;   EYE SURGERY     HEMOSTASIS CLIP PLACEMENT  11/26/2021   Procedure: HEMOSTASIS CLIP PLACEMENT;  Surgeon: Carol Ada, MD;  Location: [facility];  Service: Endoscopy;;   HOT HEMOSTASIS N/A 11/26/2021   Procedure: HOT HEMOSTASIS (ARGON PLASMA COAGULATION/BICAP);  Surgeon: Carol Ada, MD;  Location: [facility];  Service: Endoscopy;  Laterality: N/A;  EGD and COLON   INCISION AND DRAINAGE PERIRECTAL ABSCESS N/A 02/27/2020   Procedure: IRRIGATION AND DEBRIDEMENT PERIRECTAL ABSCESS;  Surgeon: Donnie Mesa, MD;  Location: [facility];  Service: General;  Laterality: N/A;   IRRIGATION AND DEBRIDEMENT ABSCESS  02/27/2020   Procedure(s) Performed: IRRIGATION AND [place] (N/A Perineum)   POLYPECTOMY  11/26/2021   Procedure: POLYPECTOMY;  Surgeon: Carol Ada, MD;  [facility];  Service: Endoscopy;;     Current Outpatient Medications  Medication Sig Dispense Refill   alendronate (FOSAMAX) 70 MG tablet TAKE 1 TABLET BY MOUTH ONE TIME PER WEEK (Patient taking differently: Take 70 mg by mouth every Sunday.) 12 tablet 3   ALPRAZolam (XANAX) 1 MG tablet Take 1 mg by mouth 3 (three) times daily as needed for anxiety or sleep.     Calcium Carbonate-Vit D-Min (CALCIUM 1200  PO) Take 1 tablet by mouth daily.     Cholecalciferol (VITAMIN D) 50 MCG (2000 UT) tablet Take 2,000 Units by mouth daily.     DULoxetine (CYMBALTA) 30 MG capsule Take 30 mg by mouth at bedtime.     esomeprazole (NEXIUM) 40 MG capsule Take 40 mg by mouth daily at 12 noon.     fluticasone (FLONASE) 50 MCG/ACT nasal spray Place 1 spray into both nostrils daily. (Patient taking differently: Place 1 spray into both nostrils daily as needed for allergies.) 16 g 5   Iron-FA-B  Cmp-C-Biot-Probiotic (FUSION PLUS) CAPS TAKE 1 CAPSULE BY MOUTH EVERY DAY 30 capsule 3   Magnesium 500 MG CAPS Take 500 mg by mouth daily.     Omega-3 Fatty Acids (FISH OIL) 1000 MG CAPS Take 1,000 mg by mouth daily.      traZODone (DESYREL) 100 MG tablet Take 50-100 mg by mouth at bedtime.     No current facility-administered medications for this visit.    Allergies:   Patient has no known allergies.    Social History:  The patient  reports that she has been smoking cigarettes. She started smoking about 47 years ago. She has a 33.00 pack-year smoking history. She has never used smokeless tobacco. She reports current alcohol use of about 2.0 standard drinks per week. She reports that she does not use drugs.   Family History:  The patient's family history includes Alcohol abuse in her father and mother; Dementia in her mother; Diabetes in her brother; Heart attack in her maternal grandmother; Heart disease in her brother and mother.    ROS:  Please see the history of present illness.   Otherwise, review of systems are positive for none.   All other systems are reviewed and negative.    PHYSICAL EXAM: VS:  BP (!) 144/92    Pulse 86    Ht 5\' 2"  (1.575 m)    Wt 139 lb 12.8 oz (63.4 kg)    BMI 25.57 kg/m  , BMI Body mass index is 25.57 kg/m. GENERAL:  Well appearing HEENT:  Pupils equal round and reactive, fundi not visualized, oral mucosa unremarkable NECK:  No jugular venous distention, waveform within normal limits, carotid upstroke brisk and symmetric, no bruits, no thyromegaly LYMPHATICS:  No cervical adenopathy LUNGS:  Clear to auscultation bilaterally HEART:  RRR.  PMI not displaced or sustained,S1 and S2 within normal limits, no S3, no S4, no clicks, no rubs, no murmurs ABD:  Flat, positive bowel sounds normal in frequency in pitch, no bruits, no rebound, no guarding, no midline pulsatile mass, no hepatomegaly, no splenomegaly EXT:  2 plus pulses throughout, no edema, no cyanosis no  clubbing SKIN:  No rashes no nodules NEURO:  Cranial nerves II through XII grossly intact, motor grossly intact throughout PSYCH:  Cognitively intact, oriented to person place and time    EKG:  EKG is ordered today. The ekg ordered today demonstrates ectopic atrial rhythm.  Rate 86 bpm.  Nonspecific ST abnormalities.  Echo 09/17/2021: 1. Left ventricular ejection fraction, by estimation, is 60 to 65%. Left  ventricular ejection fraction by PLAX is 63 %. The left ventricle has  normal function. The left ventricle has no regional wall motion  abnormalities. There is mild concentric left  ventricular hypertrophy. Left ventricular diastolic parameters are  consistent with Grade I diastolic dysfunction (impaired relaxation). The  average left ventricular global longitudinal strain is -19.9 %. The global  longitudinal strain is normal.   2.  Right ventricular systolic function is normal. The right ventricular  size is normal. There is normal pulmonary artery systolic pressure.   3. Left atrial size was severely dilated.   4. The mitral valve is normal in structure. No evidence of mitral valve  regurgitation. No evidence of mitral stenosis. There is mild prolapse of  posterior leaflet of the mitral valve.   5. The aortic valve is tricuspid. Aortic valve regurgitation is not  visualized. No aortic stenosis is present.   6. Aortic dilatation noted. There is borderline dilatation of the  ascending aorta, measuring 36 mm.   7. The inferior vena cava is normal in size with greater than 50%  respiratory variability, suggesting right atrial pressure of 3 mmHg.    Recent Labs: 02/24/2021: ALT 9; BUN 5; Creatinine, Ser 0.97; Magnesium 2.4; Potassium 4.6; Sodium 139; TSH 1.890 08/30/2021: Hemoglobin 9.9; Platelets 305    Lipid Panel    Component Value Date/Time   CHOL 157 02/24/2021 1623   TRIG 59 02/24/2021 1623   HDL 88 02/24/2021 1623   CHOLHDL 1.8 02/24/2021 1623   LDLCALC 57 02/24/2021  1623      Wt Readings from Last 3 Encounters:  11/29/21 139 lb 12.8 oz (63.4 kg)  11/26/21 135 lb (61.2 kg)  08/30/21 146 lb 12.8 oz (66.6 kg)      ASSESSMENT AND PLAN:  Atherosclerosis of aorta (HCC) Noted on chest CT.  LDL goal is less than 70.  Her lipids are already at goal.  Therefore we will not start a statin.  Likely more related to her smoking history.  Ascending aortic aneurysm 4.0 cm on chest CT 09/2021.  Plan to get an echo 09/2022.  Essential hypertension Blood pressure was elevated in the office today.  She attributes this to whitecoat syndrome and notes that it is usually better on repeat.  It did improve somewhat but still was above goal, especially given her aortic aneurysm.  She is going to get blood pressure cuff and track her blood pressures at home.  She will bring to follow-up.  We will also enroll her in the PR EP program to the [facility]  Cigarette smoker Ongoing tobacco use.  We will have our care guide reach out to her for smoking cessation assistance.  Exertional dyspnea May be related to ongoing tobacco abuse or anemia.  However she also has care vascular risk factors.  We will get an exercise Myoview to assess for ischemia.  Moderate episode of recurrent major depressive disorder [facility]) Patient continues to struggle with depression.  She was encouraged to keep following up with her mental health provider as it would be difficult to make progress with her tobacco and alcohol abuse as well as with exercise and less this is better controlled.    Current medicines are reviewed at length with the patient today.  The patient does not have concerns regarding medicines.  The following changes have been made:  no change  Labs/ tests ordered today include:   Orders Placed This Encounter  Procedures   Amb Referral To Provider Referral Exercise Program (P.R.E.P)   MYOCARDIAL PERFUSION IMAGING   EKG 12-Lead     Disposition:   FU with Shigeru Lampert C. Oval Linsey, MD,  [facility] in 3 months.    Signed, Westen Dinino C. Oval Linsey, MD, [facility]  11/29/2021 9:06 AM    [place]

## 2021-11-29 NOTE — Assessment & Plan Note (Signed)
May be related to ongoing tobacco abuse or anemia.  However she also has care vascular risk factors.  We will get an exercise Myoview to assess for ischemia.

## 2021-11-29 NOTE — Assessment & Plan Note (Signed)
Patient continues to struggle with depression.  She was encouraged to keep following up with her mental health provider as it would be difficult to make progress with her tobacco and alcohol abuse as well as with exercise and less this is better controlled.

## 2021-11-29 NOTE — Assessment & Plan Note (Signed)
4.0 cm on chest CT 09/2021.  Plan to get an echo 09/2022.

## 2021-11-29 NOTE — Assessment & Plan Note (Signed)
Noted on chest CT.  LDL goal is less than 70.  Her lipids are already at goal.  Therefore we will not start a statin.  Likely more related to her smoking history.

## 2021-11-29 NOTE — Patient Instructions (Signed)
Medication Instructions:  Your physician recommends that you continue on your current medications as directed. Please refer to the Current Medication list given to you today.   *If you need a refill on your cardiac medications before your next appointment, please call your pharmacy*  Lab Work: NONE  Testing/Procedures: Your physician has requested that you have en exercise stress myoview. For further information please visit [email or web address]. Please follow instruction sheet, as given.  Follow-Up: At [facility], you and your health needs are our priority.  As part of our continuing mission to provide you with exceptional heart care, we have created designated Provider Care Teams.  These Care Teams include your primary Cardiologist (physician) and Advanced Practice Providers (APPs -  Physician Assistants and Nurse Practitioners) who all work together to provide you with the care you need, when you need it.  We recommend signing up for the patient portal called "MyChart".  Sign up information is provided on this After Visit Summary.  MyChart is used to connect with patients for Virtual Visits (Telemedicine).  Patients are able to view lab/test results, encounter notes, upcoming appointments, etc.  Non-urgent messages can be sent to your provider as well.   To learn more about what you can do with MyChart, go to [email or web address].    Your next appointment:   3 month(s)  The format for your next appointment:   In Person  Provider:   Skeet Latch, MD, [name], NP, or Coletta Memos, NP   Other Instructions   MONITOR YOUR BLOOD PRESSURE DAILY WITH GOAL OF BELOW 130/80   WILL HAVE AMY OUR CARE GUIDE REACH OUT TO YOU FOR SMOKING   LORA OR PAM WILL BE IN TOUCH WITH YOUR REGARDING THE PREP ([facility]) PROGRAM

## 2021-12-03 ENCOUNTER — Telehealth (HOSPITAL_COMMUNITY): Payer: Self-pay | Admitting: *Deleted

## 2021-12-03 ENCOUNTER — Telehealth: Payer: Self-pay

## 2021-12-03 NOTE — Telephone Encounter (Signed)
Close encounter 

## 2021-12-03 NOTE — Telephone Encounter (Signed)
She returned my call; wants to attend at [facility], April class; will contact later this month to confirm dates/times for April class and set up assessment visit ?

## 2021-12-03 NOTE — Telephone Encounter (Signed)
Called to discuss PREP program referral; left voicemail. 

## 2021-12-07 ENCOUNTER — Ambulatory Visit (HOSPITAL_COMMUNITY)
Admission: RE | Admit: 2021-12-07 | Discharge: 2021-12-07 | Disposition: A | Discharge status: Home / Self Care | Payer: Medicare Other | Source: Ambulatory Visit | Attending: Cardiology | Admitting: Cardiology

## 2021-12-07 ENCOUNTER — Other Ambulatory Visit: Payer: Self-pay

## 2021-12-07 DIAGNOSIS — I700 Atherosclerosis of aorta: Secondary | ICD-10-CM | POA: Diagnosis not present

## 2021-12-07 DIAGNOSIS — R0609 Other forms of dyspnea: Secondary | ICD-10-CM | POA: Diagnosis not present

## 2021-12-07 LAB — MYOCARDIAL PERFUSION IMAGING
LV dias vol: 56 mL (ref 46–106)
LV sys vol: 16 mL
Nuc Stress EF: 72 %
Peak HR: 126 {beats}/min
Rest HR: 61 {beats}/min
Rest Nuclear Isotope Dose: 10.5 mCi
SDS: 3
SRS: 2
SSS: 3
Stress Nuclear Isotope Dose: 32.2 mCi
TID: 0.92

## 2021-12-07 MED ORDER — TECHNETIUM TC 99M TETROFOSMIN IV KIT
10.5000 | PACK | Freq: Once | INTRAVENOUS | Status: AC | PRN
  Administered 2021-12-07: 10.5 via INTRAVENOUS
  Filled 2021-12-07: qty 11

## 2021-12-07 MED ORDER — TECHNETIUM TC 99M TETROFOSMIN IV KIT
32.2000 | PACK | Freq: Once | INTRAVENOUS | Status: AC | PRN
  Administered 2021-12-07: 32.2 via INTRAVENOUS
  Filled 2021-12-07: qty 33

## 2021-12-07 MED ORDER — REGADENOSON 0.4 MG/5ML IV SOLN
0.4000 mg | Freq: Once | INTRAVENOUS | Status: AC
  Administered 2021-12-07: 0.4 mg via INTRAVENOUS

## 2021-12-13 DIAGNOSIS — R448 Other symptoms and signs involving general sensations and perceptions: Secondary | ICD-10-CM | POA: Diagnosis not present

## 2021-12-13 DIAGNOSIS — H9203 Otalgia, bilateral: Secondary | ICD-10-CM | POA: Diagnosis not present

## 2021-12-20 DIAGNOSIS — F411 Generalized anxiety disorder: Secondary | ICD-10-CM | POA: Diagnosis not present

## 2022-01-05 ENCOUNTER — Telehealth: Payer: Self-pay

## 2022-01-05 DIAGNOSIS — F411 Generalized anxiety disorder: Secondary | ICD-10-CM | POA: Diagnosis not present

## 2022-01-05 DIAGNOSIS — F331 Major depressive disorder, recurrent, moderate: Secondary | ICD-10-CM | POA: Diagnosis not present

## 2022-01-05 NOTE — Telephone Encounter (Signed)
Called cell and home number to confirm attending next PREP class at [facility] and set up assessment visit; left voicemail. ?

## 2022-01-07 ENCOUNTER — Telehealth: Payer: Self-pay

## 2022-01-07 NOTE — Telephone Encounter (Signed)
She returned my call from yesterday; confirmed she wants to attend PREP on 4/17 at [facility], every M/W 4-5:15; assessment visit scheduled for 4/12 at 12 noon. ?

## 2022-01-12 NOTE — Progress Notes (Signed)
[facility] PREP Evaluation ? ?Patient Details  ?Name: Maureen Rodgers ?MRN: [id number] ?Date of Birth: Oct 11, 1950 ?Age: 71 y.o. ?PCP: Glendale Chard, MD ? ?Vitals:  ? 01/12/22 1237  ?BP: 126/72  ?Pulse: 80  ?SpO2: 97%  ?Weight: 131 lb 6.4 oz (59.6 kg)  ? ? ? [facility] Eval - 01/12/22 1200   ? ?  ? [facility]PREP[facility] Location  ? [facility]   ?  ? Referral   ? Referring Provider Oval Linsey   ? Reason for referral Current Smoker;Hypertension;Inactivity   ? Program Start Date 01/17/22   ?  ? Measurement  ? Waist Circumference 35.5 inches   ? Hip Circumference 38 inches   ? Body fat 17.5 percent   ?  ? Information for Trainer  ? Goals --   Establish exercise routine, learn to eat healthier alternatives, maintain BP 120/80 or less to avoid taking medication  ? Current Exercise none   ? Orthopedic Concerns back pain   ? Pertinent Medical History --   HTN, depression  ?  ? Timed Up and Go (TUGS)  ? Timed Up and Go Low risk <9 seconds   ?  ? Mobility and Daily Activities  ? I find it easy to walk up or down two or more flights of stairs. 1   ? I have no trouble taking out the trash. 4   ? I do housework such as vacuuming and dusting on my own without difficulty. 2   ? I can easily lift a gallon of milk (8lbs). 3   ? I can easily walk a mile. 1   ? I have no trouble reaching into high cupboards or reaching down to pick up something from the floor. 2   ? I do not have trouble doing out-door work such as Armed forces logistics/support/administrative officer, raking leaves, or gardening. 2   ?  ? Mobility and Daily Activities  ? I feel younger than my age. 1   ? I feel independent. 2   ? I feel energetic. 1   ? I live an active life.  1   ? I feel strong. 1   ? I feel healthy. 1   ? I feel active as other people my age. 1   ?  ? How fit and strong are you.  ? Fit and Strong Total Score 23   ? ?  ?  ? ?  ? ?Past Medical History:  ?Diagnosis Date  ? Ascending aortic aneurysm 11/29/2021  ? Depression   ? Essential hypertension 11/29/2021  ? GERD (gastroesophageal  reflux disease)   ? Tobacco use   ? ?Past Surgical History:  ?Procedure Laterality Date  ? ABDOMINAL HYSTERECTOMY    ? BILATERAL CARPAL TUNNEL RELEASE  2018  ? 2018 on Right, many years ago on left  ? COLONOSCOPY WITH PROPOFOL N/A 11/26/2021  ? Procedure: COLONOSCOPY WITH PROPOFOL;  Surgeon: Carol Ada, MD;  [facility]: [facility];  Service: Endoscopy;  Laterality: N/A;  ? ESOPHAGOGASTRODUODENOSCOPY (EGD) WITH PROPOFOL N/A 11/26/2021  ? Procedure: ESOPHAGOGASTRODUODENOSCOPY (EGD) WITH PROPOFOL;  Surgeon: Carol Ada, MD;  [facility]: [facility];  Service: Endoscopy;  Laterality: N/A;  ? EYE SURGERY    ? HEMOSTASIS CLIP PLACEMENT  11/26/2021  ? Procedure: HEMOSTASIS CLIP PLACEMENT;  Surgeon: Carol Ada, MD;  [facility]: [facility];  Service: Endoscopy;;  ? HOT HEMOSTASIS N/A 11/26/2021  ? Procedure: HOT HEMOSTASIS (ARGON PLASMA COAGULATION/BICAP);  Surgeon: Carol Ada, MD;  Location: [facility];  Service:  Endoscopy;  Laterality: N/A;  EGD and COLON  ? INCISION AND DRAINAGE PERIRECTAL ABSCESS N/A 02/27/2020  ? Procedure: IRRIGATION AND DEBRIDEMENT PERIRECTAL ABSCESS;  Surgeon: Donnie Mesa, MD;  Location: [place];  Service: General;  Laterality: N/A;  ? [place] ABSCESS  02/27/2020  ? Procedure(s) Performed: IRRIGATION AND DEBRIDEMENT PERIRECTAL ABSCESS (N/A Perineum)  ? POLYPECTOMY  11/26/2021  ? Procedure: POLYPECTOMY;  Surgeon: Carol Ada, MD;  Location: [facility];  Service: Endoscopy;;  ? ?Social History  ? ?Tobacco Use  ?Smoking Status Every Day  ? Packs/day: 0.75  ? Years: 44.00  ? Pack years: 33.00  ? Types: Cigarettes  ? Start date: 10/03/1974  ?Smokeless Tobacco Never  ?Tobacco Comments  ? has been smoking 1ppd x 18 months, in addition to above history. Right now, she is not ready to quit. Currently in therapy.   ?To begin PREP class at [facility] starting April 17, every M/W 4-5:15 ? ?[place] ?01/12/2022, 12:41 PM ? ? ?

## 2022-01-17 DIAGNOSIS — U071 COVID-19: Secondary | ICD-10-CM | POA: Diagnosis not present

## 2022-01-25 NOTE — Progress Notes (Signed)
[facility] PREP Weekly Session ? ?Patient Details  ?Name: Maureen Rodgers ?MRN: [id number] ?Date of Birth: September 12, 1951 ?Age: 71 y.o. ?PCP: Glendale Chard, MD ? ?There were no vitals filed for this visit. ? ? [facility] Weekly seesion - 01/25/22 0900   ? ?  ? [facility]PREP[facility] Location  ? [facility]   ?  ? Weekly Session  ? Topic Discussed Goal setting and welcome to the program   Introductions, review of PREP workbook, tour of facility, open for intro to cardio machines  ? ?  ?  ? ?  ? ? ?[place] ?01/25/2022, 9:01 AM ? ? ?

## 2022-02-01 NOTE — Progress Notes (Signed)
[facility] PREP Weekly Session ? ?Patient Details  ?Name: Maureen Rodgers ?MRN: [id number] ?Date of Birth: [birth year] ?Age: 71 y.o. ?PCP: Glendale Chard, MD ? ?Vitals:  ? 02/01/22 0922  ?Weight: 130 lb (59 kg)  ? ? ? [facility] Weekly seesion - 02/01/22 0900   ? ?  ? [facility]  ? [facility]   ?  ? Weekly Session  ? Topic Discussed Importance of resistance training;Other ways to be active   Goal to sit no longer than 30 minutes; work up to 150 min cardio/wk and strength training 1-2 times/wk for 20-40 minutes  ? Classes attended to date 3   ? ?  ?  ? ?  ? ? ?[place] ?02/01/2022, 9:23 AM ? ? ?

## 2022-02-07 DIAGNOSIS — F411 Generalized anxiety disorder: Secondary | ICD-10-CM | POA: Diagnosis not present

## 2022-02-08 NOTE — Progress Notes (Signed)
[facility] PREP Weekly Session ? ?Patient Details  ?Name: KARMIN KASPRZAK ?MRN: [id number] ?Date of Birth: [birth year] ?Age: 71 y.o. ?PCP: Glendale Chard, MD ? ?Vitals:  ? 02/08/22 0839  ?Weight: 129 lb (58.5 kg)  ? ? ? [facility] Weekly seesion - 02/08/22 0800   ? ?  ? [facility]  ? [facility]   ?  ? Weekly Session  ? Topic Discussed Healthy eating tips   Healthy oils, added sugars, enriched/processed grains, sodium; water intake work up to 1/2 body weigth in ounces  ? Minutes exercised this week 70 minutes   ? Classes attended to date 5   ? ?  ?  ? ?  ? ? ?Zachery Dakins [place] ?02/08/2022, 8:43 AM ? ? ?

## 2022-02-10 DIAGNOSIS — Z86010 Personal history of colonic polyps: Secondary | ICD-10-CM | POA: Diagnosis not present

## 2022-02-10 DIAGNOSIS — K219 Gastro-esophageal reflux disease without esophagitis: Secondary | ICD-10-CM | POA: Diagnosis not present

## 2022-02-10 DIAGNOSIS — D509 Iron deficiency anemia, unspecified: Secondary | ICD-10-CM | POA: Diagnosis not present

## 2022-02-10 DIAGNOSIS — K921 Melena: Secondary | ICD-10-CM | POA: Diagnosis not present

## 2022-02-10 DIAGNOSIS — K449 Diaphragmatic hernia without obstruction or gangrene: Secondary | ICD-10-CM | POA: Diagnosis not present

## 2022-02-10 DIAGNOSIS — K5730 Diverticulosis of large intestine without perforation or abscess without bleeding: Secondary | ICD-10-CM | POA: Diagnosis not present

## 2022-02-17 DIAGNOSIS — M67911 Unspecified disorder of synovium and tendon, right shoulder: Secondary | ICD-10-CM | POA: Diagnosis not present

## 2022-02-17 DIAGNOSIS — M25511 Pain in right shoulder: Secondary | ICD-10-CM | POA: Diagnosis not present

## 2022-02-22 NOTE — Progress Notes (Signed)
[facility] PREP Weekly Session  Patient Details  Name: Maureen Rodgers MRN: [id number] Date of Birth: [birth year] Age: 71 y.o. PCP: Glendale Chard, MD  There were no vitals filed for this visit.   [facility] Weekly seesion - 02/22/22 0800       [facility] "PREP" Location   [facility] "PREP" Location [facility]      Weekly Session   Topic Discussed Restaurant Eating   Salt demo; 1500-'2300mg'$  sodium intake for the day; myfitnesspal app and review of YUKA app   Classes attended to date [place] 02/22/2022, 8:55 AM

## 2022-02-28 NOTE — Progress Notes (Unsigned)
Office Visit    Patient Name: MARGARETTA CHITTUM Date of Encounter: 03/01/2022  PCP:  Glendale Chard, [place]  Cardiologist:  Skeet Latch, MD  Advanced Practice Provider:  No care team member to display Electrophysiologist:  None      Chief Complaint    Maureen Rodgers is a 71 y.o. female with a hx of aortic atherosclerosis, IDA, tobacco use, GERD, EtOH abuse, hyperlipidemia, tobacco abuse, depression presents today for follow-up after Myoview  Past Medical History    Past Medical History:  Diagnosis Date   Ascending aortic aneurysm ([place]) 11/29/2021   Depression    Essential hypertension 11/29/2021   GERD (gastroesophageal reflux disease)    Tobacco use    Past Surgical History:  Procedure Laterality Date   ABDOMINAL HYSTERECTOMY     BILATERAL CARPAL TUNNEL RELEASE  2018   2018 on Right, many years ago on left   COLONOSCOPY WITH PROPOFOL N/A 11/26/2021   Procedure: COLONOSCOPY WITH PROPOFOL;  Surgeon: Carol Ada, MD;  Location: [facility];  Service: Endoscopy;  Laterality: N/A;   ESOPHAGOGASTRODUODENOSCOPY (EGD) WITH PROPOFOL N/A 11/26/2021   Procedure: ESOPHAGOGASTRODUODENOSCOPY (EGD) WITH PROPOFOL;  Surgeon: Carol Ada, MD;  Location: [facility];  Service: Endoscopy;  Laterality: N/A;   EYE SURGERY     HEMOSTASIS CLIP PLACEMENT  11/26/2021   Procedure: HEMOSTASIS CLIP PLACEMENT;  Surgeon: Carol Ada, MD;  Location: [facility];  Service: Endoscopy;;   HOT HEMOSTASIS N/A 11/26/2021   Procedure: HOT HEMOSTASIS (ARGON PLASMA COAGULATION/BICAP);  Surgeon: Carol Ada, MD;  Location: [facility];  Service: Endoscopy;  Laterality: N/A;  EGD and COLON   INCISION AND DRAINAGE PERIRECTAL ABSCESS N/A 02/27/2020   Procedure: IRRIGATION AND DEBRIDEMENT PERIRECTAL ABSCESS;  Surgeon: Donnie Mesa, MD;  Location: [facility];  Service: General;  Laterality: N/A;   IRRIGATION AND DEBRIDEMENT ABSCESS  02/27/2020   Procedure(s) Performed:  IRRIGATION AND DEBRIDEMENT PERIRECTAL ABSCESS (N/A Perineum)   POLYPECTOMY  11/26/2021   Procedure: POLYPECTOMY;  Surgeon: Carol Ada, MD;  Location: [facility];  Service: Endoscopy;;    Allergies  No Known Allergies  History of Present Illness    Maureen Rodgers is a 71 y.o. female with a hx of aortic atherosclerosis, IDA, tobacco use, GERD, EtOH abuse, hyperlipidemia, tobacco abuse, depression  last seen 11/29/2021 by Dr. Oval Linsey.  Seen in consult by Dr. Oval Linsey 11/2021 at the request of her PCP due to dyspnea.  It was noted that anemia may have been contributing.  Echo 09/2019 LVEF 60 to 65%, mild LVH, grade 1 diastolic dysfunction, mild posterior valve leaflet prolapse and no regurgitation.  At clinic visit she noted exertional dyspnea though was not getting much exercise.  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  She smokes 0.5 PPD starting at age 80 and also reported drinking 3 large beers daily. Her BP was elevated and she was recommended to monitor at home. She was previously able to stop drinking for 6 years.Myoview ordered and performed 12/27/21 was low risk study.   She presents today for follow up. She has been participating in [employer].  Tells me two weeks ago her right rotor cuff became inflamed and is following with orthopedics. She was given numbing shot as well as Meloxicam. Reports no chest pain, pressure, tightness. Smoking 10-15 cigarettes per day and is motivated to quit and has contacted 1 800 quit now as well as purchased nicotine gum and  patches.  She does have a good neighborhood to walk in but cites her inactivity is related to her depression.  She is monitoring blood pressure a few times per week at home with readings most often 140s over 90s with arm cuff.  EKGs/Labs/Other Studies Reviewed:   The following studies were reviewed today:  Myoview 12/07/21   The study is normal. The study is low risk.   horizontal ST  depression was noted in inferior leads during stress   LV perfusion is normal. There is no evidence of ischemia. There is no evidence of infarction.   Left ventricular function is normal. End diastolic cavity size is normal. End systolic cavity size is normal.   Prior study not available for comparison.    Echo 09/17/2021: 1. Left ventricular ejection fraction, by estimation, is 60 to 65%. Left  ventricular ejection fraction by PLAX is 63 %. The left ventricle has  normal function. The left ventricle has no regional wall motion  abnormalities. There is mild concentric left  ventricular hypertrophy. Left ventricular diastolic parameters are  consistent with Grade I diastolic dysfunction (impaired relaxation). The  average left ventricular global longitudinal strain is -19.9 %. The global  longitudinal strain is normal.   2. Right ventricular systolic function is normal. The right ventricular  size is normal. There is normal pulmonary artery systolic pressure.   3. Left atrial size was severely dilated.   4. The mitral valve is normal in structure. No evidence of mitral valve  regurgitation. No evidence of mitral stenosis. There is mild prolapse of  posterior leaflet of the mitral valve.   5. The aortic valve is tricuspid. Aortic valve regurgitation is not  visualized. No aortic stenosis is present.   6. Aortic dilatation noted. There is borderline dilatation of the  ascending aorta, measuring 36 mm.   7. The inferior vena cava is normal in size with greater than 50%  respiratory variability, suggesting right atrial pressure of 3 mmHg.     EKG:   no EKG today  Recent Labs: 08/30/2021: Hemoglobin 9.9; Platelets 305  Recent Lipid Panel    Component Value Date/Time   CHOL 157 02/24/2021 1623   TRIG 59 02/24/2021 1623   HDL 88 02/24/2021 1623   CHOLHDL 1.8 02/24/2021 1623   LDLCALC 57 02/24/2021 1623   Home Medications   Current Meds  Medication Sig   alendronate (FOSAMAX) 70  MG tablet TAKE 1 TABLET BY MOUTH ONE TIME PER WEEK (Patient taking differently: Take 70 mg by mouth every Sunday.)   ALPRAZolam (XANAX) 1 MG tablet Take 1 mg by mouth 3 (three) times daily as needed for anxiety or sleep.   Calcium Carbonate-Vit D-Min (CALCIUM 1200 PO) Take 1 tablet by mouth daily.   Cholecalciferol (VITAMIN D) 50 MCG (2000 UT) tablet Take 2,000 Units by mouth daily.   DULoxetine (CYMBALTA) 30 MG capsule Take 30 mg by mouth at bedtime.   esomeprazole (NEXIUM) 40 MG capsule Take 40 mg by mouth daily at 12 noon.   fluticasone (FLONASE) 50 MCG/ACT nasal spray Place 1 spray into both nostrils daily. (Patient taking differently: Place 1 spray into both nostrils daily as needed for allergies.)   Iron-FA-B Cmp-C-Biot-Probiotic (FUSION PLUS) CAPS TAKE 1 CAPSULE BY MOUTH EVERY DAY   Magnesium 500 MG CAPS Take 500 mg by mouth daily.   meloxicam (MOBIC) 7.5 MG tablet Take 7.5 mg by mouth daily. Prescribed 1 a day for two weeks   Omega-3 Fatty Acids (FISH OIL) 1000  MG CAPS Take 1,000 mg by mouth daily.    traZODone (DESYREL) 100 MG tablet Take 50-100 mg by mouth at bedtime.     Review of Systems      All other systems reviewed and are otherwise negative except as noted above.  Physical Exam    VS:  BP 130/80   Pulse 89   Ht '5\' 2"'$  (1.575 m)   Wt 127 lb 9.6 oz (57.9 kg)   BMI 23.34 kg/m  , BMI Body mass index is 23.34 kg/m.  Wt Readings from Last 3 Encounters:  03/01/22 127 lb 9.6 oz (57.9 kg)  02/08/22 129 lb (58.5 kg)  02/01/22 130 lb (59 kg)     GEN: Well nourished, well developed, in no acute distress. HEENT: normal. Neck: Supple, no JVD, carotid bruits, or masses. Cardiac: RRR, no murmurs, rubs, or gallops. No clubbing, cyanosis, edema.  Radials/PT 2+ and equal bilaterally.  Respiratory:  Respirations regular and unlabored, clear to auscultation bilaterally. GI: Soft, nontender, nondistended. MS: No deformity or atrophy. Skin: Warm and dry, no rash. Neuro:  Strength  and sensation are intact. Psych: Normal affect.  Assessment & Plan    Shortness of breath - Myoview 12/2021 low risk study. Echo 09/2021 normal LVEF, gr1DD, mild MVP. Likely multifactorial deconditioning, IDA, tobacco use. She is motivated to quit smoking, detailed below.  No indication for additional cardiovascular work-up at this time.  Elevated BP without diagnosis of HTN - BP at home 140s/90s with arm cuff. She did not bring to clinic to check accuracy today and encouraged to bring with her next time.  BP in clinic today 130/80.  She is somewhat hesitant to start medication.  As such we discussed reducing sodium, increasing physical activity and repeat office visit in 2 months.  If BP not at goal of less than 130/80 at that time plan to start antihypertensive regimen such as ARB versus HCTZ.  Mitral valve prolapse -echo 09/21/2021 normal LVEF, mild prolapse of posterior leaflet of the mitral valve.  Continue to monitor with periodic echo.  Continue optimal blood pressure control.  Ascending aortic aneurysm -CT chest 03/2021 4.0 cm. Echo 09/2021 with borderline dilation 46m. Plan for repeat imaging 09/2022.   Depression - Continue to follow with PCP and counselor.   Tobacco use - Smoking cessation encouraged. **smoking patches. Recommend utilization of 1800QUITNOW. CT chest 03/2021 with Lung-RADS 2 recommended for repeat CT 03/2022. Managed by PCP.    Alcohol use - Previously able to quit for 6 years. Now 3 large beers daily.  Cessation encouraged.  Aortic atherosclerosis / HLD - 03/2021 CT with aortic atherosclerosis. 01/2021 total cholesterol 157, HDL 88, LDL 57, triglycerides 59. As lipid already at goal, defer statin.  Disposition: Follow up in 2 month(s) with TSkeet Latch MD or APP.  Signed, CLoel Dubonnet NP 03/01/2022, 9:24 AM [place]

## 2022-03-01 ENCOUNTER — Ambulatory Visit (INDEPENDENT_AMBULATORY_CARE_PROVIDER_SITE_OTHER): Payer: Medicare Other | Admitting: Family

## 2022-03-01 ENCOUNTER — Encounter (HOSPITAL_BASED_OUTPATIENT_CLINIC_OR_DEPARTMENT_OTHER): Payer: Self-pay | Admitting: Family

## 2022-03-01 VITALS — BP 130/80 | HR 89 | Ht 62.0 in | Wt 127.6 lb

## 2022-03-01 DIAGNOSIS — I341 Nonrheumatic mitral (valve) prolapse: Secondary | ICD-10-CM | POA: Diagnosis not present

## 2022-03-01 DIAGNOSIS — Z789 Other specified health status: Secondary | ICD-10-CM

## 2022-03-01 DIAGNOSIS — R0609 Other forms of dyspnea: Secondary | ICD-10-CM | POA: Diagnosis not present

## 2022-03-01 DIAGNOSIS — I700 Atherosclerosis of aorta: Secondary | ICD-10-CM

## 2022-03-01 DIAGNOSIS — I7121 Aneurysm of the ascending aorta, without rupture: Secondary | ICD-10-CM

## 2022-03-01 DIAGNOSIS — Z720 Tobacco use: Secondary | ICD-10-CM | POA: Diagnosis not present

## 2022-03-01 DIAGNOSIS — R030 Elevated blood-pressure reading, without diagnosis of hypertension: Secondary | ICD-10-CM | POA: Diagnosis not present

## 2022-03-01 NOTE — Patient Instructions (Addendum)
Medication Instructions:  Continue your current medications.   *If you need a refill on your cardiac medications before your next appointment, please call your pharmacy*  Lab Work: None ordered today.   Testing/Procedures: Your stress test looked great with no evidence of blockages in your heart.    Follow-Up: At [facility], you and your health needs are our priority.  As part of our continuing mission to provide you with exceptional heart care, we have created designated Provider Care Teams.  These Care Teams include your primary Cardiologist (physician) and Advanced Practice Providers (APPs -  Physician Assistants and Nurse Practitioners) who all work together to provide you with the care you need, when you need it.  We recommend signing up for the patient portal called "MyChart".  Sign up information is provided on this After Visit Summary.  MyChart is used to connect with patients for Virtual Visits (Telemedicine).  Patients are able to view lab/test results, encounter notes, upcoming appointments, etc.  Non-urgent messages can be sent to your provider as well.   To learn more about what you can do with MyChart, go to [email or web address].    Your next appointment:   2 month(s)  The format for your next appointment:   In Person  Provider:   Skeet Latch, MD or Loel Dubonnet, NP     Other Instructions  Exercises to do While Sitting  Exercises that you do while sitting (chair exercises) can give you many of the same benefits as full exercise. Benefits include strengthening your heart, burning calories, and keeping muscles and joints healthy. Exercise can also improve your mood and help with depression and anxiety. You may benefit from chair exercises if you are unable to do standing exercises due to: Diabetic foot pain. Obesity. Illness. Arthritis. Recovery from surgery or injury. Breathing problems. Balance problems. Another type of disability. Before  starting chair exercises, check with your health care provider or a physical therapist to find out how much exercise you can tolerate and which exercises are safe for you. If your health care provider approves: Start out slowly and build up over time. Aim to work up to about 10-20 minutes for each exercise session. Make exercise part of your daily routine. Drink water when you exercise. Do not wait until you are thirsty. Drink every 10-15 minutes. Stop exercising right away if you have pain, nausea, shortness of breath, or dizziness. If you are exercising in a wheelchair, make sure to lock the wheels. Ask your health care provider whether you can do tai chi or yoga. Many positions in these mind-body exercises can be modified to do while seated. Warm-up Before starting other exercises: Sit up as straight as you can. Have your knees bent at 90 degrees, which is the shape of the capital letter "L." Keep your feet flat on the floor. Sit at the front edge of your chair, if you can. Pull in (tighten) the muscles in your abdomen and stretch your spine and neck as straight as you can. Hold this position for a few minutes. Breathe in and out evenly. Try to concentrate on your breathing, and relax your mind. Stretching Exercise A: Arm stretch Hold your arms out straight in front of your body. Bend your hands at the wrist with your fingers pointing up, as if signaling someone to stop. Notice the slight tension in your forearms as you hold the position. Keeping your arms out and your hands bent, rotate your hands outward as far as you can  and hold this stretch. Aim to have your thumbs pointing up and your pinkie fingers pointing down. Slowly repeat arm stretches for one minute as tolerated. Exercise B: Leg stretch If you can move your legs, try to "draw" letters on the floor with the toes of your foot. Write your name with one foot. Write your name with the toes of your other foot. Slowly repeat the  movements for one minute as tolerated. Exercise C: Reach for the sky Reach your hands as far over your head as you can to stretch your spine. Move your hands and arms as if you are climbing a rope. Slowly repeat the movements for one minute as tolerated. Range of motion exercises Exercise A: Shoulder roll Let your arms hang loosely at your sides. Lift just your shoulders up toward your ears, then let them relax back down. When your shoulders feel loose, rotate your shoulders in backward and forward circles. Do shoulder rolls slowly for one minute as tolerated. Exercise B: March in place As if you are marching, pump your arms and lift your legs up and down. Lift your knees as high as you can. If you are unable to lift your knees, just pump your arms and move your ankles and feet up and down. March in place for one minute as tolerated. Exercise C: Seated jumping jacks Let your arms hang down straight. Keeping your arms straight, lift them up over your head. Aim to point your fingers to the ceiling. While you lift your arms, straighten your legs and slide your heels along the floor to your sides, as wide as you can. As you bring your arms back down to your sides, slide your legs back together. If you are unable to use your legs, just move your arms. Slowly repeat seated jumping jacks for one minute as tolerated. Strengthening exercises Exercise A: Shoulder squeeze Hold your arms straight out from your body to your sides, with your elbows bent and your fists pointed at the ceiling. Keeping your arms in the bent position, move them forward so your elbows and forearms meet in front of your face. Open your arms back out as wide as you can with your elbows still bent, until you feel your shoulder blades squeezing together. Hold for 5 seconds. Slowly repeat the movements forward and backward for one minute as tolerated. Contact a health care provider if: You have to stop exercising due to any of  the following: Pain. Nausea. Shortness of breath. Dizziness. Fatigue. You have significant pain or soreness after exercising. Get help right away if: You have chest pain. You have difficulty breathing. These symptoms may represent a serious problem that is an emergency. Do not wait to see if the symptoms will go away. Get medical help right away. Call your local emergency services (911 in the [place] Do not drive yourself to the hospital. Summary Exercises that you do while sitting (chair exercises) can strengthen your heart, burn calories, and keep muscles and joints healthy. You may benefit from chair exercises if you are unable to do standing exercises due to diabetic foot pain, obesity, recovery from surgery or injury, or other conditions. Before starting chair exercises, check with your health care provider or a physical therapist to find out how much exercise you can tolerate and which exercises are safe for you. This information is not intended to replace advice given to you by your health care provider. Make sure you discuss any questions you have with your health care provider.  Document Revised: 11/15/2020 Document Reviewed: 11/15/2020 Elsevier Patient Education  [place] of Quitting Smoking Quitting smoking is a physical and mental challenge. You may have cravings, withdrawal symptoms, and temptation to smoke. Before quitting, work with your health care provider to make a plan that can help you manage quitting. Making a plan before you quit may keep you from smoking when you have the urge to smoke while trying to quit. How to manage lifestyle changes Managing stress Stress can make you want to smoke, and wanting to smoke may cause stress. It is important to find ways to manage your stress. You could try some of the following: Practice relaxation techniques. Breathe slowly and deeply, in through your nose and out through your mouth. Listen to  music. Soak in a bath or take a shower. Imagine a peaceful place or vacation. Get some support. Talk with family or friends about your stress. Join a support group. Talk with a counselor or therapist. Get some physical activity. Go for a walk, run, or bike ride. Play a favorite sport. Practice yoga.  Medicines Talk with your health care provider about medicines that might help you deal with cravings and make quitting easier for you. Relationships Social situations can be difficult when you are quitting smoking. To manage this, you can: Avoid parties and other social situations where people might be smoking. Avoid alcohol. Leave right away if you have the urge to smoke. Explain to your family and friends that you are quitting smoking. Ask for support and let them know you might be a bit grumpy. Plan activities where smoking is not an option. General instructions Be aware that many people gain weight after they quit smoking. However, not everyone does. To keep from gaining weight, have a plan in place before you quit, and stick to the plan after you quit. Your plan should include: Eating healthy snacks. When you have a craving, it may help to: Eat popcorn, or try carrots, celery, or other cut vegetables. Chew sugar-free gum. Changing how you eat. Eat small portion sizes at meals. Eat 4-6 small meals throughout the day instead of 1-2 large meals a day. Be mindful when you eat. You should avoid watching television or doing other things that might distract you as you eat. Exercising regularly. Make time to exercise each day. If you do not have time for a long workout, do short bouts of exercise for 5-10 minutes several times a day. Do some form of strengthening exercise, such as weight lifting. Do some exercise that gets your heart beating and causes you to breathe deeply, such as walking fast, running, swimming, or biking. This is very important. Drinking plenty of water or other  low-calorie or no-calorie drinks. Drink enough fluid to keep your urine pale yellow.  How to recognize withdrawal symptoms Your body and mind may experience discomfort as you try to get used to not having nicotine in your system. These effects are called withdrawal symptoms. They may include: Feeling hungrier than normal. Having trouble concentrating. Feeling irritable or restless. Having trouble sleeping. Feeling depressed. Craving a cigarette. These symptoms may surprise you, but they are normal to have when quitting smoking. To manage withdrawal symptoms: Avoid places, people, and activities that trigger your cravings. Remember why you want to quit. Get plenty of sleep. Avoid coffee and other drinks that contain caffeine. These may worsen some of your symptoms. How to manage cravings Come up with a plan for how to deal  with your cravings. The plan should include the following: A definition of the specific situation you want to deal with. An activity or action you will take to replace smoking. A clear idea for how this action will help. The name of someone who could help you with this. Cravings usually last for 5-10 minutes. Consider taking the following actions to help you with your plan to deal with cravings: Keep your mouth busy. Chew sugar-free gum. Suck on hard candies or a straw. Brush your teeth. Keep your hands and body busy. Change to a different activity right away. Squeeze or play with a ball. Do an activity or a hobby, such as making bead jewelry, practicing needlepoint, or working with wood. Mix up your normal routine. Take a short exercise break. Go for a quick walk, or run up and down stairs. Focus on doing something kind or helpful for someone else. Call a friend or family member to talk during a craving. Join a support group. Contact a quitline. Where to find support To get help or find a support group: Call the [facility]'s Smoking Quitline:  1-800-QUIT-NOW 4120146460) Text QUIT to SmokefreeTXT: 850277 Where to find more information Visit these websites to find more information on quitting smoking: U.[place]. Department of Health and Human Services: www.smokefree.gov American Lung Association: www.freedomfromsmoking.org Centers for Disease Control and Prevention (CDC): http://www.wolf.info/ American Heart Association: www.heart.org Contact a health care provider if: You want to change your plan for quitting. The medicines you are taking are not helping. Your eating feels out of control or you cannot sleep. You feel depressed or become very anxious. Summary Quitting smoking is a physical and mental challenge. You will face cravings, withdrawal symptoms, and temptation to smoke again. Preparation can help you as you go through these challenges. Try different techniques to manage stress, handle social situations, and prevent weight gain. You can deal with cravings by keeping your mouth busy (such as by chewing gum), keeping your hands and body busy, calling family or friends, or contacting a quitline for people who want to quit smoking. You can deal with withdrawal symptoms by avoiding places where people smoke, getting plenty of rest, and avoiding drinks that contain caffeine. This information is not intended to replace advice given to you by your health care provider. Make sure you discuss any questions you have with your health care provider. Document Revised: 09/10/2021 Document Reviewed: 09/10/2021 Elsevier Patient Education  [place].

## 2022-03-03 DIAGNOSIS — N9089 Other specified noninflammatory disorders of vulva and perineum: Secondary | ICD-10-CM | POA: Diagnosis not present

## 2022-03-03 DIAGNOSIS — Z6823 Body mass index (BMI) 23.0-23.9, adult: Secondary | ICD-10-CM | POA: Diagnosis not present

## 2022-03-03 DIAGNOSIS — F418 Other specified anxiety disorders: Secondary | ICD-10-CM | POA: Diagnosis not present

## 2022-03-03 DIAGNOSIS — R350 Frequency of micturition: Secondary | ICD-10-CM | POA: Diagnosis not present

## 2022-03-03 DIAGNOSIS — Z01411 Encounter for gynecological examination (general) (routine) with abnormal findings: Secondary | ICD-10-CM | POA: Diagnosis not present

## 2022-03-03 DIAGNOSIS — M810 Age-related osteoporosis without current pathological fracture: Secondary | ICD-10-CM | POA: Diagnosis not present

## 2022-03-03 DIAGNOSIS — D1739 Benign lipomatous neoplasm of skin and subcutaneous tissue of other sites: Secondary | ICD-10-CM | POA: Diagnosis not present

## 2022-03-03 DIAGNOSIS — Z1231 Encounter for screening mammogram for malignant neoplasm of breast: Secondary | ICD-10-CM | POA: Diagnosis not present

## 2022-03-03 DIAGNOSIS — L738 Other specified follicular disorders: Secondary | ICD-10-CM | POA: Diagnosis not present

## 2022-03-10 ENCOUNTER — Other Ambulatory Visit: Payer: Self-pay | Admitting: Obstetrics & Gynecology

## 2022-03-10 ENCOUNTER — Encounter: Payer: Self-pay | Admitting: Internal Medicine

## 2022-03-10 ENCOUNTER — Ambulatory Visit (INDEPENDENT_AMBULATORY_CARE_PROVIDER_SITE_OTHER): Payer: Medicare Other | Admitting: Internal Medicine

## 2022-03-10 ENCOUNTER — Ambulatory Visit: Payer: Medicare Other

## 2022-03-10 ENCOUNTER — Ambulatory Visit: Payer: Medicare Other | Admitting: Internal Medicine

## 2022-03-10 VITALS — BP 104/70 | HR 73 | Temp 97.8°F | Ht 61.8 in | Wt 124.8 lb

## 2022-03-10 DIAGNOSIS — F1020 Alcohol dependence, uncomplicated: Secondary | ICD-10-CM | POA: Insufficient documentation

## 2022-03-10 DIAGNOSIS — M810 Age-related osteoporosis without current pathological fracture: Secondary | ICD-10-CM

## 2022-03-10 DIAGNOSIS — D508 Other iron deficiency anemias: Secondary | ICD-10-CM

## 2022-03-10 DIAGNOSIS — I700 Atherosclerosis of aorta: Secondary | ICD-10-CM | POA: Diagnosis not present

## 2022-03-10 DIAGNOSIS — F17210 Nicotine dependence, cigarettes, uncomplicated: Secondary | ICD-10-CM | POA: Diagnosis not present

## 2022-03-10 DIAGNOSIS — Z6822 Body mass index (BMI) 22.0-22.9, adult: Secondary | ICD-10-CM | POA: Diagnosis not present

## 2022-03-10 NOTE — Progress Notes (Signed)
Rich Brave [place],acting as a Education administrator for [name], MD.,have documented all relevant documentation on the behalf of [name], MD,as directed by  [name], MD while in the presence of [name], MD.  This visit occurred during the SARS-CoV-2 public health emergency.  Safety protocols were in place, including screening questions prior to the visit, additional usage of staff PPE, and extensive cleaning of exam room while observing appropriate contact time as indicated for disinfecting solutions.  Subjective:     Patient ID: Maureen Rodgers , female    DOB: 10/13/1950 , 71 y.o.   MRN: [id number]   Chief Complaint  Patient presents with   Hyperlipidemia    HPI  Patient presents today for a chol f/u. She was started on simvastatin at her last visit. She is tolerating it well. She has not had any issues with the medication.   Hyperlipidemia This is a chronic problem. The current episode started more than 1 year ago. She has no history of diabetes. Pertinent negatives include no leg pain, myalgias or shortness of breath. Current antihyperlipidemic treatment includes statins. Compliance problems include adherence to exercise.  Risk factors for coronary artery disease include a sedentary lifestyle and post-menopausal.  Anemia Presents for follow-up visit. There has been no anorexia, malaise/fatigue, pallor or palpitations.     Past Medical History:  Diagnosis Date   Ascending aortic aneurysm ([place]) 11/29/2021   Depression    Essential hypertension 11/29/2021   GERD (gastroesophageal reflux disease)    Tobacco use      Family History  Problem Relation Age of Onset   Heart disease Mother    Dementia Mother    Alcohol abuse Mother    Alcohol abuse Father    Heart disease Brother    Diabetes Brother    Heart attack Maternal Grandmother      Current Outpatient Medications:    alendronate (FOSAMAX) 70 MG tablet, TAKE 1 TABLET BY MOUTH ONE TIME PER WEEK  (Patient taking differently: Take 70 mg by mouth every Sunday.), Disp: 12 tablet, Rfl: 3   ALPRAZolam (XANAX) 1 MG tablet, Take 1 mg by mouth 3 (three) times daily as needed for anxiety or sleep., Disp: , Rfl:    Calcium Carbonate-Vit D-Min (CALCIUM 1200 PO), Take 1 tablet by mouth daily., Disp: , Rfl:    Cholecalciferol (VITAMIN D) 50 MCG (2000 UT) tablet, Take 2,000 Units by mouth daily., Disp: , Rfl:    DULoxetine (CYMBALTA) 30 MG capsule, Take 30 mg by mouth at bedtime., Disp: , Rfl:    esomeprazole (NEXIUM) 40 MG capsule, Take 40 mg by mouth daily at 12 noon., Disp: , Rfl:    fluticasone (FLONASE) 50 MCG/ACT nasal spray, Place 1 spray into both nostrils daily. (Patient taking differently: Place 1 spray into both nostrils daily as needed for allergies.), Disp: 16 g, Rfl: 5   Iron-FA-B Cmp-C-Biot-Probiotic (FUSION PLUS) CAPS, TAKE 1 CAPSULE BY MOUTH EVERY DAY (Patient taking differently: TAKE 1 CAPSULE Mon- Fri), Disp: 30 capsule, Rfl: 3   Magnesium 500 MG CAPS, Take 500 mg by mouth daily., Disp: , Rfl:    meloxicam (MOBIC) 7.5 MG tablet, Take 7.5 mg by mouth daily. Prescribed 1 a day for two weeks (Patient not taking: Reported on 03/24/2022), Disp: , Rfl:    Omega-3 Fatty Acids (FISH OIL) 1000 MG CAPS, Take 1,000 mg by mouth daily. , Disp: , Rfl:    traZODone (DESYREL) 100 MG tablet, Take 50-100 mg by mouth  at bedtime., Disp: , Rfl:    No Known Allergies   Review of Systems  Constitutional: Negative.  Negative for malaise/fatigue.  Respiratory: Negative.  Negative for shortness of breath.   Cardiovascular: Negative.  Negative for palpitations.  Gastrointestinal: Negative.  Negative for anorexia.  Musculoskeletal:  Negative for myalgias.  Skin:  Negative for pallor.  Neurological: Negative.   Psychiatric/Behavioral: Negative.       Today's Vitals   03/10/22 1102  BP: 104/70  Pulse: 73  Temp: 97.8 F (36.6 C)  Weight: 124 lb 12.8 oz (56.6 kg)  Height: 5' 1.8" (1.57 m)  PainSc: 0-No  pain   Body mass index is 22.97 kg/m.  Wt Readings from Last 3 Encounters:  03/24/22 120 lb 12.8 oz (54.8 kg)  03/21/22 122 lb (55.3 kg)  03/15/22 126 lb (57.2 kg)     Objective:  Physical Exam Vitals and nursing note reviewed.  Constitutional:      Appearance: Normal appearance.  HENT:     Head: Normocephalic and atraumatic.  Eyes:     Extraocular Movements: Extraocular movements intact.  Cardiovascular:     Rate and Rhythm: Normal rate and regular rhythm.     Heart sounds: Normal heart sounds.  Pulmonary:     Effort: Pulmonary effort is normal.     Breath sounds: Normal breath sounds.  Musculoskeletal:     Cervical back: Normal range of motion.  Skin:    General: Skin is warm.  Neurological:     General: No focal deficit present.     Mental Status: She is alert.  Psychiatric:        Mood and Affect: Mood normal.        Behavior: Behavior normal.       Assessment And Plan:     1. Atherosclerosis of aorta ([place]) Comments: She is now on statin therapy, LDL goal <70. Will hold off on adding ASA given heme pos stools.  - Lipid panel  2. Cigarette smoker Comments: Risks associated with continued tobacco use was d/w patient in full detail. She is not yet ready to quit, encouraged to decrease number of cigs smoked/day.  - CT CHEST LUNG CA SCREEN LOW DOSE W/O CM; Future  3. Other iron deficiency anemia Comments: She was referred to GI in Dec 2022 for eval of heme pos stools. Colonoscopy was performed Feb 2023, results reviewed. Significant for single polyp and a single,localized angiodysplastic lesion w/o bleeding. Both were treated w/o complication. I anticipate her levels to have improved.  - CBC no Diff - Iron, TIBC and Ferritin Panel - CMP14+EGFR  4. BMI 22.0-22.9, adult Comments: Her BMI is acceptable for her demographic. She is encouraged to gradually increase her daily activity.    Patient was given opportunity to ask questions. Patient verbalized understanding  of the plan and was able to repeat key elements of the plan. All questions were answered to their satisfaction.   I, [name], MD, have reviewed all documentation for this visit. The documentation on 03/10/22 for the exam, diagnosis, procedures, and orders are all accurate and complete.   IF YOU HAVE BEEN REFERRED TO A SPECIALIST, IT MAY TAKE 1-2 WEEKS TO SCHEDULE/PROCESS THE REFERRAL. IF YOU HAVE NOT HEARD FROM US/SPECIALIST IN TWO WEEKS, PLEASE GIVE [place] A CALL AT [phone].   THE PATIENT IS ENCOURAGED TO PRACTICE SOCIAL DISTANCING DUE TO THE COVID-19 PANDEMIC.

## 2022-03-11 LAB — CBC
Hematocrit: 43.9 % (ref 34.0–46.6)
Hemoglobin: 15 g/dL (ref 11.1–15.9)
MCH: 33.2 pg — ABNORMAL HIGH (ref 26.6–33.0)
MCHC: 34.2 g/dL (ref 31.5–35.7)
MCV: 97 fL (ref 79–97)
Platelets: 331 10*3/uL (ref 150–450)
RBC: 4.52 x10E6/uL (ref 3.77–5.28)
RDW: 13.2 % (ref 11.7–15.4)
WBC: 5 10*3/uL (ref 3.4–10.8)

## 2022-03-11 LAB — LIPID PANEL
Chol/HDL Ratio: 1.6 ratio (ref 0.0–4.4)
Cholesterol, Total: 179 mg/dL (ref 100–199)
HDL: 110 mg/dL (ref >39)
LDL Chol Calc (NIH): 59 mg/dL (ref 0–99)
Triglycerides: 50 mg/dL (ref 0–149)
VLDL Cholesterol Cal: 10 mg/dL (ref 5–40)

## 2022-03-11 LAB — CMP14+EGFR
ALT: 14 IU/L (ref 0–32)
AST: 16 IU/L (ref 0–40)
Albumin/Globulin Ratio: 2.2 (ref 1.2–2.2)
Albumin: 4.6 g/dL (ref 3.7–4.7)
Alkaline Phosphatase: 67 IU/L (ref 44–121)
BUN/Creatinine Ratio: 10 — ABNORMAL LOW (ref 12–28)
BUN: 8 mg/dL (ref 8–27)
Bilirubin Total: 0.5 mg/dL (ref 0.0–1.2)
CO2: 25 mmol/L (ref 20–29)
Calcium: 10.2 mg/dL (ref 8.7–10.3)
Chloride: 95 mmol/L — ABNORMAL LOW (ref 96–106)
Creatinine, Ser: 0.83 mg/dL (ref 0.57–1.00)
Globulin, Total: 2.1 g/dL (ref 1.5–4.5)
Glucose: 118 mg/dL — ABNORMAL HIGH (ref 70–99)
Potassium: 4.5 mmol/L (ref 3.5–5.2)
Sodium: 135 mmol/L (ref 134–144)
Total Protein: 6.7 g/dL (ref 6.0–8.5)
eGFR: 75 mL/min/{1.73_m2} (ref >59)

## 2022-03-11 LAB — IRON,TIBC AND FERRITIN PANEL
Ferritin: 125 ng/mL (ref 15–150)
Iron Saturation: 51 % (ref 15–55)
Iron: 144 ug/dL — ABNORMAL HIGH (ref 27–139)
Total Iron Binding Capacity: 281 ug/dL (ref 250–450)
UIBC: 137 ug/dL (ref 118–369)

## 2022-03-15 ENCOUNTER — Encounter: Payer: Self-pay | Admitting: *Deleted

## 2022-03-15 DIAGNOSIS — M6281 Muscle weakness (generalized): Secondary | ICD-10-CM | POA: Diagnosis not present

## 2022-03-15 DIAGNOSIS — M25611 Stiffness of right shoulder, not elsewhere classified: Secondary | ICD-10-CM | POA: Diagnosis not present

## 2022-03-15 NOTE — Progress Notes (Signed)
[facility] PREP Weekly Session  Patient Details  Name: Maureen Rodgers MRN: [id number] Date of Birth: 25-Jun-1951 Age: 71 y.o. PCP: Glendale Chard, MD  Vitals:   03/15/22 1208  Weight: 126 lb (57.2 kg)      Norris Cross 03/15/2022, 12:11 PM

## 2022-03-16 DIAGNOSIS — F411 Generalized anxiety disorder: Secondary | ICD-10-CM | POA: Diagnosis not present

## 2022-03-17 ENCOUNTER — Other Ambulatory Visit (INDEPENDENT_AMBULATORY_CARE_PROVIDER_SITE_OTHER): Payer: Medicare Other

## 2022-03-17 DIAGNOSIS — D649 Anemia, unspecified: Secondary | ICD-10-CM

## 2022-03-17 LAB — HEMOCCULT GUIAC POC 1CARD (OFFICE)
Card #2 Fecal Occult Blod, POC: NEGATIVE
Card #3 Fecal Occult Blood, POC: NEGATIVE
Fecal Occult Blood, POC: NEGATIVE

## 2022-03-18 DIAGNOSIS — M6281 Muscle weakness (generalized): Secondary | ICD-10-CM | POA: Diagnosis not present

## 2022-03-18 DIAGNOSIS — M25611 Stiffness of right shoulder, not elsewhere classified: Secondary | ICD-10-CM | POA: Diagnosis not present

## 2022-03-21 DIAGNOSIS — M6281 Muscle weakness (generalized): Secondary | ICD-10-CM | POA: Diagnosis not present

## 2022-03-21 DIAGNOSIS — M25611 Stiffness of right shoulder, not elsewhere classified: Secondary | ICD-10-CM | POA: Diagnosis not present

## 2022-03-21 NOTE — Progress Notes (Signed)
[facility] PRE[facility] Weekly Session  Patient Details  Name: Maureen Rodgers MRN: [id number] Date of Birth: 03/04/1951 Age: 71 y.o. PCP: Glendale Chard, MD  Vitals:   03/21/22 1700  Weight: 122 lb (55.3 kg)     [facility] Weekly seesion - 03/21/22 1700       [facility] "PREP" Location   [facility] Location [facility]      Weekly Session   Topic Discussed Expectations and non-scale victories   Halfway through program,time to check in for  review/revisit/restate goals   Minutes exercised this week 60 minutes    Classes attended to date [place] 03/21/2022, 5:02 PM

## 2022-03-23 DIAGNOSIS — Z961 Presence of intraocular lens: Secondary | ICD-10-CM | POA: Diagnosis not present

## 2022-03-23 DIAGNOSIS — H04203 Unspecified epiphora, bilateral lacrimal glands: Secondary | ICD-10-CM | POA: Diagnosis not present

## 2022-03-24 ENCOUNTER — Ambulatory Visit (INDEPENDENT_AMBULATORY_CARE_PROVIDER_SITE_OTHER): Payer: Medicare Other

## 2022-03-24 VITALS — BP 118/70 | HR 88 | Temp 98.2°F | Ht 63.0 in | Wt 120.8 lb

## 2022-03-24 DIAGNOSIS — Z0000 Encounter for general adult medical examination without abnormal findings: Secondary | ICD-10-CM

## 2022-03-24 NOTE — Patient Instructions (Signed)
Maureen Rodgers , Thank you for taking time to come for your Medicare Wellness Visit. I appreciate your ongoing commitment to your health goals. Please review the following plan we discussed and let me know if I can assist you in the future.   Screening recommendations/referrals: Colonoscopy: completed 11/26/2021 Mammogram: due Bone Density: scheduled for 08/15/2022 Recommended yearly ophthalmology/optometry visit for glaucoma screening and checkup Recommended yearly dental visit for hygiene and checkup  Vaccinations: Influenza vaccine: due 05/03/2022 Pneumococcal vaccine: completed 03/09/2021 Tdap vaccine: completed 03/03/2020, due 03/03/2030 Shingles vaccine: completed    Covid-19: 07/27/2021, 03/09/2021, 09/11/2020, 11/26/2019, 10/29/2019  Advanced directives: Advance directive discussed with you today. Even though you declined this today please call our office should you change your mind and we can give you the proper paperwork for you to fill out.  Conditions/risks identified: none  Next appointment: Follow up in one year for your annual wellness visit    Preventive Care 65 Years and Older, Female Preventive care refers to lifestyle choices and visits with your health care provider that can promote health and wellness. What does preventive care include? A yearly physical exam. This is also called an annual well check. Dental exams once or twice a year. Routine eye exams. Ask your health care provider how often you should have your eyes checked. Personal lifestyle choices, including: Daily care of your teeth and gums. Regular physical activity. Eating a healthy diet. Avoiding tobacco and drug use. Limiting alcohol use. Practicing safe sex. Taking low-dose aspirin every day. Taking vitamin and mineral supplements as recommended by your health care provider. What happens during an annual well check? The services and screenings done by your health care provider during your annual well check  will depend on your age, overall health, lifestyle risk factors, and family history of disease. Counseling  Your health care provider may ask you questions about your: Alcohol use. Tobacco use. Drug use. Emotional well-being. Home and relationship well-being. Sexual activity. Eating habits. History of falls. Memory and ability to understand (cognition). Work and work Statistician. Reproductive health. Screening  You may have the following tests or measurements: Height, weight, and BMI. Blood pressure. Lipid and cholesterol levels. These may be checked every 5 years, or more frequently if you are over 52 years old. Skin check. Lung cancer screening. You may have this screening every year starting at age 23 if you have a 30-pack-year history of smoking and currently smoke or have quit within the past 15 years. Fecal occult blood test (FOBT) of the stool. You may have this test every year starting at age 37. Flexible sigmoidoscopy or colonoscopy. You may have a sigmoidoscopy every 5 years or a colonoscopy every 10 years starting at age 22. Hepatitis C blood test. Hepatitis B blood test. Sexually transmitted disease (STD) testing. Diabetes screening. This is done by checking your blood sugar (glucose) after you have not eaten for a while (fasting). You may have this done every 1-3 years. Bone density scan. This is done to screen for osteoporosis. You may have this done starting at age 58. Mammogram. This may be done every 1-2 years. Talk to your health care provider about how often you should have regular mammograms. Talk with your health care provider about your test results, treatment options, and if necessary, the need for more tests. Vaccines  Your health care provider may recommend certain vaccines, such as: Influenza vaccine. This is recommended every year. Tetanus, diphtheria, and acellular pertussis (Tdap, Td) vaccine. You may need a Td booster  every 10 years. Zoster vaccine. You  may need this after age 6. Pneumococcal 13-valent conjugate (PCV13) vaccine. One dose is recommended after age 95. Pneumococcal polysaccharide (PPSV23) vaccine. One dose is recommended after age 24. Talk to your health care provider about which screenings and vaccines you need and how often you need them. This information is not intended to replace advice given to you by your health care provider. Make sure you discuss any questions you have with your health care provider. Document Released: 10/16/2015 Document Revised: 06/08/2016 Document Reviewed: 07/21/2015 Elsevier Interactive Patient Education  2017 [place] Prevention in the Home Falls can cause injuries. They can happen to people of all ages. There are many things you can do to make your home safe and to help prevent falls. What can I do on the outside of my home? Regularly fix the edges of walkways and driveways and fix any cracks. Remove anything that might make you trip as you walk through a door, such as a raised step or threshold. Trim any bushes or trees on the path to your home. Use bright outdoor lighting. Clear any walking paths of anything that might make someone trip, such as rocks or tools. Regularly check to see if handrails are loose or broken. Make sure that both sides of any steps have handrails. Any raised decks and porches should have guardrails on the edges. Have any leaves, snow, or ice cleared regularly. Use sand or salt on walking paths during winter. Clean up any spills in your garage right away. This includes oil or grease spills. What can I do in the bathroom? Use night lights. Install grab bars by the toilet and in the tub and shower. Do not use towel bars as grab bars. Use non-skid mats or decals in the tub or shower. If you need to sit down in the shower, use a plastic, non-slip stool. Keep the floor dry. Clean up any water that spills on the floor as soon as it happens. Remove soap buildup  in the tub or shower regularly. Attach bath mats securely with double-sided non-slip rug tape. Do not have throw rugs and other things on the floor that can make you trip. What can I do in the bedroom? Use night lights. Make sure that you have a light by your bed that is easy to reach. Do not use any sheets or blankets that are too big for your bed. They should not hang down onto the floor. Have a firm chair that has side arms. You can use this for support while you get dressed. Do not have throw rugs and other things on the floor that can make you trip. What can I do in the kitchen? Clean up any spills right away. Avoid walking on wet floors. Keep items that you use a lot in easy-to-reach places. If you need to reach something above you, use a strong step stool that has a grab bar. Keep electrical cords out of the way. Do not use floor polish or wax that makes floors slippery. If you must use wax, use non-skid floor wax. Do not have throw rugs and other things on the floor that can make you trip. What can I do with my stairs? Do not leave any items on the stairs. Make sure that there are handrails on both sides of the stairs and use them. Fix handrails that are broken or loose. Make sure that handrails are as long as the stairways. Check any carpeting to  make sure that it is firmly attached to the stairs. Fix any carpet that is loose or worn. Avoid having throw rugs at the top or bottom of the stairs. If you do have throw rugs, attach them to the floor with carpet tape. Make sure that you have a light switch at the top of the stairs and the bottom of the stairs. If you do not have them, ask someone to add them for you. What else can I do to help prevent falls? Wear shoes that: Do not have high heels. Have rubber bottoms. Are comfortable and fit you well. Are closed at the toe. Do not wear sandals. If you use a stepladder: Make sure that it is fully opened. Do not climb a closed  stepladder. Make sure that both sides of the stepladder are locked into place. Ask someone to hold it for you, if possible. Clearly mark and make sure that you can see: Any grab bars or handrails. First and last steps. Where the edge of each step is. Use tools that help you move around (mobility aids) if they are needed. These include: Canes. Walkers. Scooters. Crutches. Turn on the lights when you go into a dark area. Replace any light bulbs as soon as they burn out. Set up your furniture so you have a clear path. Avoid moving your furniture around. If any of your floors are uneven, fix them. If there are any pets around you, be aware of where they are. Review your medicines with your doctor. Some medicines can make you feel dizzy. This can increase your chance of falling. Ask your doctor what other things that you can do to help prevent falls. This information is not intended to replace advice given to you by your health care provider. Make sure you discuss any questions you have with your health care provider. Document Released: 07/16/2009 Document Revised: 02/25/2016 Document Reviewed: 10/24/2014 Elsevier Interactive Patient Education  2017 [employer].

## 2022-03-25 DIAGNOSIS — M25611 Stiffness of right shoulder, not elsewhere classified: Secondary | ICD-10-CM | POA: Diagnosis not present

## 2022-03-25 DIAGNOSIS — M6281 Muscle weakness (generalized): Secondary | ICD-10-CM | POA: Diagnosis not present

## 2022-03-28 DIAGNOSIS — M6281 Muscle weakness (generalized): Secondary | ICD-10-CM | POA: Diagnosis not present

## 2022-03-28 DIAGNOSIS — M25611 Stiffness of right shoulder, not elsewhere classified: Secondary | ICD-10-CM | POA: Diagnosis not present

## 2022-03-30 DIAGNOSIS — M6281 Muscle weakness (generalized): Secondary | ICD-10-CM | POA: Diagnosis not present

## 2022-03-30 DIAGNOSIS — M25611 Stiffness of right shoulder, not elsewhere classified: Secondary | ICD-10-CM | POA: Diagnosis not present

## 2022-03-31 DIAGNOSIS — F331 Major depressive disorder, recurrent, moderate: Secondary | ICD-10-CM | POA: Diagnosis not present

## 2022-03-31 DIAGNOSIS — F411 Generalized anxiety disorder: Secondary | ICD-10-CM | POA: Diagnosis not present

## 2022-04-06 DIAGNOSIS — M6281 Muscle weakness (generalized): Secondary | ICD-10-CM | POA: Diagnosis not present

## 2022-04-06 DIAGNOSIS — M25611 Stiffness of right shoulder, not elsewhere classified: Secondary | ICD-10-CM | POA: Diagnosis not present

## 2022-04-19 DIAGNOSIS — M6281 Muscle weakness (generalized): Secondary | ICD-10-CM | POA: Diagnosis not present

## 2022-04-19 DIAGNOSIS — M25611 Stiffness of right shoulder, not elsewhere classified: Secondary | ICD-10-CM | POA: Diagnosis not present

## 2022-04-21 NOTE — Progress Notes (Signed)
Did not complete final assessment; last class attended was June 21; completed 5 workout sessions and 6 education sessions.

## 2022-04-30 ENCOUNTER — Other Ambulatory Visit: Payer: Self-pay | Admitting: Internal Medicine

## 2022-05-02 ENCOUNTER — Ambulatory Visit (INDEPENDENT_AMBULATORY_CARE_PROVIDER_SITE_OTHER): Payer: Medicare Other | Admitting: Family

## 2022-05-02 ENCOUNTER — Encounter (HOSPITAL_BASED_OUTPATIENT_CLINIC_OR_DEPARTMENT_OTHER): Payer: Self-pay | Admitting: Family

## 2022-05-02 VITALS — BP 128/76 | HR 96 | Ht 62.0 in | Wt 118.0 lb

## 2022-05-02 DIAGNOSIS — Z720 Tobacco use: Secondary | ICD-10-CM | POA: Diagnosis not present

## 2022-05-02 DIAGNOSIS — Z789 Other specified health status: Secondary | ICD-10-CM | POA: Diagnosis not present

## 2022-05-02 DIAGNOSIS — I341 Nonrheumatic mitral (valve) prolapse: Secondary | ICD-10-CM

## 2022-05-02 DIAGNOSIS — R030 Elevated blood-pressure reading, without diagnosis of hypertension: Secondary | ICD-10-CM | POA: Diagnosis not present

## 2022-05-02 DIAGNOSIS — I700 Atherosclerosis of aorta: Secondary | ICD-10-CM | POA: Diagnosis not present

## 2022-05-02 DIAGNOSIS — R0609 Other forms of dyspnea: Secondary | ICD-10-CM | POA: Diagnosis not present

## 2022-05-02 NOTE — Patient Instructions (Signed)
Medication Instructions:  Your Physician recommend you continue on your current medication as directed.    *If you need a refill on your cardiac medications before your next appointment, please call your pharmacy*  Testing/Procedures: Your physician has requested that you have an echocardiogram in December. Echocardiography is a painless test that uses sound waves to create images of your heart. It provides your doctor with information about the size and shape of your heart and how well your heart's chambers and valves are working. This procedure takes approximately one hour. There are no restrictions for this procedure. [place], you and your health needs are our priority.  As part of our continuing mission to provide you with exceptional heart care, we have created designated Provider Care Teams.  These Care Teams include your primary Cardiologist (physician) and Advanced Practice Providers (APPs -  Physician Assistants and Nurse Practitioners) who all work together to provide you with the care you need, when you need it.  We recommend signing up for the patient portal called "MyChart".  Sign up information is provided on this After Visit Summary.  MyChart is used to connect with patients for Virtual Visits (Telemedicine).  Patients are able to view lab/test results, encounter notes, upcoming appointments, etc.  Non-urgent messages can be sent to your provider as well.   To learn more about what you can do with MyChart, go to [email or web address].    Your next appointment:   6 month(s)  The format for your next appointment:   In Person  Provider:   Skeet Latch, MD or [name], NP{  Other Instructions Please check your blood pressure 1 time per day and keep a log. Please call us in two weeks with this log. Please subtract 10 from top and bottom number on home cuff. If you get a reading higher than 130s/80s then okay to  recheck later that day.   Important Information About Sugar

## 2022-05-02 NOTE — Progress Notes (Signed)
Office Visit    Patient Name: Maureen Rodgers Date of Encounter: 05/02/2022  PCP:  Gl[place]ale Chard, [place]  Cardiologist:  Skeet Latch, MD  Advanced Practice Provider:  No care team member to display Electrophysiologist:  None      Chief Complaint    Maureen Rodgers is a 71 y.o. female with a hx of aortic atherosclerosis, IDA, tobacco use, GERD, EtOH abuse, hyperlipidemia, tobacco abuse, depression presents today for follow-up after Myoview  Past Medical History    Past Medical History:  Diagnosis Date   Ascending aortic aneurysm ([place]) 11/29/2021   Depression    Essential hypertension 11/29/2021   GERD (gastroesophageal reflux disease)    Tobacco use    Past Surgical History:  Procedure Laterality Date   ABDOMINAL HYSTERECTOMY     BILATERAL CARPAL TUNNEL RELEASE  2018   2018 on Right, many years ago on left   COLONOSCOPY WITH PROPOFOL N/A 11/26/2021   Procedure: COLONOSCOPY WITH PROPOFOL;  Surgeon: Carol Ada, MD;  Location: [facility];  Service: Endoscopy;  Laterality: N/A;   ESOPHAGOGASTRODUODENOSCOPY (EGD) WITH PROPOFOL N/A 11/26/2021   Procedure: ESOPHAGOGASTRODUODENOSCOPY (EGD) WITH PROPOFOL;  Surgeon: Carol Ada, MD;  Location: [facility];  Service: Endoscopy;  Laterality: N/A;   EYE SURGERY     HEMOSTASIS CLIP PLACEMENT  11/26/2021   Procedure: HEMOSTASIS CLIP PLACEMENT;  Surgeon: Carol Ada, MD;  Location: [facility];  Service: Endoscopy;;   HOT HEMOSTASIS N/A 11/26/2021   Procedure: HOT HEMOSTASIS (ARGON PLASMA COAGULATION/BICAP);  Surgeon: Carol Ada, MD;  Location: [facility];  Service: Endoscopy;  Laterality: N/A;  EGD and COLON   INCISION AND DRAINAGE PERIRECTAL ABSCESS N/A 02/27/2020   Procedure: IRRIGATION AND DEBRIDEMENT PERIRECTAL ABSCESS;  Surgeon: Donnie Mesa, MD;  Location: [facility];  Service: General;  Laterality: N/A;   IRRIGATION AND DEBRIDEMENT ABSCESS  02/27/2020   Procedure(s) Performed:  IRRIGATION AND DEBRIDEMENT PERIRECTAL ABSCESS (N/A Perineum)   POLYPECTOMY  11/26/2021   Procedure: POLYPECTOMY;  Surgeon: Carol Ada, MD;  Location: [facility];  Service: Endoscopy;;    Allergies  No Known Allergies  History of Present Illness    Maureen Rodgers is a 71 y.o. female with a hx of aortic atherosclerosis, IDA, tobacco use, GERD, EtOH abuse, hyperlipidemia, tobacco abuse, depression  last seen 03/01/22.  Seen in consult by Dr. Oval Linsey 11/2021 at the request of her PCP due to dyspnea.  It was noted that anemia may have been contributing.  Echo 09/2019 LVEF 60 to 65%, mild LVH, grade 1 diastolic dysfunction, mild posterior valve leaflet prolapse and no regurgitation.  At clinic visit she noted exertional dyspnea though was not getting much exercise.  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  She smokes 0.5 PPD starting at age 38 and also reported drinking 3 large beers daily. Her BP was elevated and she was recommended to monitor at home. She was previously able to stop drinking for 6 years.Myoview ordered and performed 12/27/21 was low risk study.   At follow up 03/01/22 noted elevated BP at home but was normal in clinic. Home monitoring encouraged. She was working to quit smoking.   She presents today for follow up. Unable to participate in PREP due to right rotator cuff injury for which she has been doing physical therapy. Reports no shortness of breath nor dyspnea on exertion. Reports no chest pain, pressure, or tightness. No edema, orthopnea, PND. Reports  no palpitations.  BP at home 130s-140/90s but home cuff found to read 10 points higher systolic and diastolic. She does have a good neighborhood to walk in but cites her inactivity is related to her depression.  Follows with counselor.  EKGs/Labs/Other Studies Reviewed:   The following studies were reviewed today:  Myoview 12/07/21   The study is normal. The study is low  risk.   horizontal ST depression was noted in inferior leads during stress   LV perfusion is normal. There is no evidence of ischemia. There is no evidence of infarction.   Left ventricular function is normal. End diastolic cavity size is normal. End systolic cavity size is normal.   Prior study not available for comparison.    Echo 09/17/2021: 1. Left ventricular ejection fraction, by estimation, is 60 to 65%. Left  ventricular ejection fraction by PLAX is 63 %. The left ventricle has  normal function. The left ventricle has no regional wall motion  abnormalities. There is mild concentric left  ventricular hypertrophy. Left ventricular diastolic parameters are  consistent with Grade I diastolic dysfunction (impaired relaxation). The  average left ventricular global longitudinal strain is -19.9 %. The global  longitudinal strain is normal.   2. Right ventricular systolic function is normal. The right ventricular  size is normal. There is normal pulmonary artery systolic pressure.   3. Left atrial size was severely dilated.   4. The mitral valve is normal in structure. No evidence of mitral valve  regurgitation. No evidence of mitral stenosis. There is mild prolapse of  posterior leaflet of the mitral valve.   5. The aortic valve is tricuspid. Aortic valve regurgitation is not  visualized. No aortic stenosis is present.   6. Aortic dilatation noted. There is borderline dilatation of the  ascending aorta, measuring 36 mm.   7. The inferior vena cava is normal in size with greater than 50%  respiratory variability, suggesting right atrial pressure of 3 mmHg.     EKG:   no EKG today  Recent Labs: 03/10/2022: ALT 14; BUN 8; Creatinine, Ser 0.83; Hemoglobin 15.0; Platelets 331; Potassium 4.5; Sodium 135  Recent Lipid Panel    Component Value Date/Time   CHOL 179 03/10/2022 1208   TRIG 50 03/10/2022 1208   HDL 110 03/10/2022 1208   CHOLHDL 1.6 03/10/2022 1208   LDLCALC 59 03/10/2022  1208   Home Medications   Current Meds  Medication Sig   alendronate (FOSAMAX) 70 MG tablet TAKE 1 TABLET BY MOUTH ONE TIME PER WEEK (Patient taking differently: Take 70 mg by mouth every Sunday.)   ALPRAZolam (XANAX) 1 MG tablet Take 1 mg by mouth 3 (three) times daily as needed for anxiety or sleep.   Calcium Carbonate-Vit D-Min (CALCIUM 1200 PO) Take 1 tablet by mouth daily.   Cholecalciferol (VITAMIN D) 50 MCG (2000 UT) tablet Take 2,000 Units by mouth daily.   DULoxetine (CYMBALTA) 30 MG capsule Take 30 mg by mouth at bedtime.   esomeprazole (NEXIUM) 40 MG capsule Take 40 mg by mouth daily at 12 noon.   fluticasone (FLONASE) 50 MCG/ACT nasal spray Place 1 spray into both nostrils daily. (Patient taking differently: Place 1 spray into both nostrils daily as needed for allergies.)   Iron-FA-B Cmp-C-Biot-Probiotic (FUSION PLUS) CAPS TAKE 1 CAPSULE Mon- Fri   Magnesium 500 MG CAPS Take 500 mg by mouth daily.   meloxicam (MOBIC) 7.5 MG tablet Take 7.5 mg by mouth daily. Prescribed 1 a day for two weeks  Omega-3 Fatty Acids (FISH OIL) 1000 MG CAPS Take 1,000 mg by mouth daily.    traZODone (DESYREL) 100 MG tablet Take 50-100 mg by mouth at bedtime.     Review of Systems      All other systems reviewed and are otherwise negative except as noted above.  Physical Exam    VS:  BP 128/76 (BP Location: Left Arm, Patient Position: Sitting)   Pulse 96   Ht '5\' 2"'$  (1.575 m)   Wt 118 lb (53.5 kg)   SpO2 97%   BMI 21.58 kg/m  , BMI Body mass index is 21.58 kg/m.  Wt Readings from Last 3 Encounters:  05/02/22 118 lb (53.5 kg)  03/24/22 120 lb 12.8 oz (54.8 kg)  03/21/22 122 lb (55.3 kg)    GEN: Well nourished, well developed, in no acute distress. HEENT: normal. Neck: Supple, no JVD, carotid bruits, or masses. Cardiac: RRR, no murmurs, rubs, or gallops. No clubbing, cyanosis, edema.  Radials/PT 2+ and equal bilaterally.  Respiratory:  Respirations regular and unlabored, clear to  auscultation bilaterally. GI: Soft, nontender, nondistended. MS: No deformity or atrophy. Skin: Warm and dry, no rash. Neuro:  Strength and sensation are intact. Psych: Normal affect.  Assessment & Plan    Shortness of breath - Myoview 12/2021 low risk study. Echo 09/2021 normal LVEF, gr1DD, mild MVP. Likely multifactorial deconditioning, IDA, tobacco use. She is motivated to quit smoking, detailed below.  No indication for additional cardiovascular work-up at this time.  Elevated BP without diagnosis of HTN - BP at home 130s-140s/90s with arm cuff. Arm cuff in clinic found to read 10 points higher systolic and diastolic. Her BP at home with correction is often <130/80. She wishes to avoid additional medications. She will monitor at home once per day and keep a log. Phone call f/u in 2 weeks. If BP medication needed, plan for Amlodipine.   Mitral valve prolapse -echo 09/21/2021 normal LVEF, mild prolapse of posterior leaflet of the mitral valve.  Continue to monitor with periodic echo.  Repeat echo ordered for 09/2022. Continue optimal blood pressure control.  Ascending aortic aneurysm -CT chest 03/2021 4.0 cm. Echo 09/2021 with borderline dilation 74m. Plan for repeat imaging 09/2022.   Depression - Continue to follow with PCP and counselor.   Tobacco use - Smoking cessation encouraged. Recommend utilization of [id number]. CT chest 03/2021 with Lung-RADS 2 recommended for repeat CT 03/2022. Planning to use nicotine patches.   Alcohol use - Previously able to quit for 6 years. Now 4 large beers daily.  Cessation encouraged.  Aortic atherosclerosis / HLD - 03/2021 CT with aortic atherosclerosis. 01/2021 total cholesterol 157, HDL 88, LDL 57, triglycerides 59. As lipid already at goal, defer statin.  Disposition: Follow up in 6 months with TSkeet Latch MD or APP.  Signed, CLoel Dubonnet NP 05/02/2022, 11:10 AM CDe Graff

## 2022-05-06 DIAGNOSIS — F411 Generalized anxiety disorder: Secondary | ICD-10-CM | POA: Diagnosis not present

## 2022-05-16 ENCOUNTER — Encounter (HOSPITAL_BASED_OUTPATIENT_CLINIC_OR_DEPARTMENT_OTHER): Payer: Self-pay

## 2022-05-24 ENCOUNTER — Other Ambulatory Visit: Payer: Medicare Other

## 2022-05-24 DIAGNOSIS — Z23 Encounter for immunization: Secondary | ICD-10-CM | POA: Diagnosis not present

## 2022-06-27 DIAGNOSIS — F411 Generalized anxiety disorder: Secondary | ICD-10-CM | POA: Diagnosis not present

## 2022-06-27 DIAGNOSIS — F331 Major depressive disorder, recurrent, moderate: Secondary | ICD-10-CM | POA: Diagnosis not present

## 2022-08-15 ENCOUNTER — Other Ambulatory Visit: Payer: Medicare Other

## 2022-08-22 ENCOUNTER — Other Ambulatory Visit: Payer: Self-pay | Admitting: Internal Medicine

## 2022-09-05 ENCOUNTER — Ambulatory Visit (INDEPENDENT_AMBULATORY_CARE_PROVIDER_SITE_OTHER): Payer: Medicare Other

## 2022-09-05 DIAGNOSIS — I700 Atherosclerosis of aorta: Secondary | ICD-10-CM

## 2022-09-05 DIAGNOSIS — I341 Nonrheumatic mitral (valve) prolapse: Secondary | ICD-10-CM

## 2022-09-05 LAB — ECHOCARDIOGRAM COMPLETE
Area-P 1/2: 3.53 cm2
S' Lateral: 1.72 cm

## 2022-09-06 DIAGNOSIS — H04123 Dry eye syndrome of bilateral lacrimal glands: Secondary | ICD-10-CM | POA: Diagnosis not present

## 2022-09-06 DIAGNOSIS — H5351 Achromatopsia: Secondary | ICD-10-CM | POA: Diagnosis not present

## 2022-09-06 DIAGNOSIS — R519 Headache, unspecified: Secondary | ICD-10-CM | POA: Diagnosis not present

## 2022-09-06 DIAGNOSIS — Z23 Encounter for immunization: Secondary | ICD-10-CM | POA: Diagnosis not present

## 2022-09-12 ENCOUNTER — Encounter: Payer: Self-pay | Admitting: Internal Medicine

## 2022-09-12 ENCOUNTER — Ambulatory Visit (INDEPENDENT_AMBULATORY_CARE_PROVIDER_SITE_OTHER): Payer: Medicare Other | Admitting: Internal Medicine

## 2022-09-12 VITALS — BP 124/78 | HR 98 | Temp 98.1°F | Ht 62.0 in | Wt 115.4 lb

## 2022-09-12 DIAGNOSIS — Z23 Encounter for immunization: Secondary | ICD-10-CM | POA: Diagnosis not present

## 2022-09-12 DIAGNOSIS — Z862 Personal history of diseases of the blood and blood-forming organs and certain disorders involving the immune mechanism: Secondary | ICD-10-CM

## 2022-09-12 DIAGNOSIS — I700 Atherosclerosis of aorta: Secondary | ICD-10-CM | POA: Diagnosis not present

## 2022-09-12 DIAGNOSIS — F1020 Alcohol dependence, uncomplicated: Secondary | ICD-10-CM

## 2022-09-12 DIAGNOSIS — D649 Anemia, unspecified: Secondary | ICD-10-CM

## 2022-09-12 DIAGNOSIS — H532 Diplopia: Secondary | ICD-10-CM | POA: Diagnosis not present

## 2022-09-12 DIAGNOSIS — Z6821 Body mass index (BMI) 21.0-21.9, adult: Secondary | ICD-10-CM

## 2022-09-12 DIAGNOSIS — Z1231 Encounter for screening mammogram for malignant neoplasm of breast: Secondary | ICD-10-CM

## 2022-09-12 DIAGNOSIS — H4710 Unspecified papilledema: Secondary | ICD-10-CM

## 2022-09-12 DIAGNOSIS — I7121 Aneurysm of the ascending aorta, without rupture: Secondary | ICD-10-CM | POA: Diagnosis not present

## 2022-09-12 DIAGNOSIS — F17210 Nicotine dependence, cigarettes, uncomplicated: Secondary | ICD-10-CM

## 2022-09-12 NOTE — Progress Notes (Signed)
Maureen Rodgers,acting as a Education administrator for Maureen Greenland, MD.,have documented all relevant documentation on the behalf of Maureen Greenland, MD,as directed by  Maureen Greenland, MD while in the presence of Maureen Greenland, MD.    Subjective:     Patient ID: Maureen Rodgers , female    DOB: 16-Sep-1951 , 71 y.o.   MRN: [id number]   Chief Complaint  Patient presents with   Anemia    HPI  Patient presents today for f/u anemia and med check. Patient reports compliance with her meds.  She denies palpitations, chest pain and shortness of breath. She was recently evaluated by Ophthalmology, exam revealed optic nerve edema. They would like for her to have specific labs drawn today.   Anemia Presents for follow-up visit. There has been no anorexia, malaise/fatigue, pallor or palpitations.  Hyperlipidemia This is a chronic problem. The current episode started more than 1 year ago. She has no history of diabetes. Pertinent negatives include no leg pain, myalgias or shortness of breath. Current antihyperlipidemic treatment includes statins. Compliance problems include adherence to exercise.  Risk factors for coronary artery disease include a sedentary lifestyle and post-menopausal.     Past Medical History:  Diagnosis Date   Ascending aortic aneurysm (Maureen Rodgers) 11/29/2021   Depression    Essential hypertension 11/29/2021   GERD (gastroesophageal reflux disease)    Tobacco use      Family History  Problem Relation Age of Onset   Heart disease Mother    Dementia Mother    Alcohol abuse Mother    Alcohol abuse Father    Heart disease Brother    Diabetes Brother    Heart attack Maternal Grandmother      Current Outpatient Medications:    alendronate (FOSAMAX) 70 MG tablet, TAKE 1 TABLET BY MOUTH ONE TIME PER WEEK (Patient taking differently: Take 70 mg by mouth every Sunday.), Disp: 12 tablet, Rfl: 3   ALPRAZolam (XANAX) 1 MG tablet, Take 1 mg by mouth 3 (three) times daily as needed for  anxiety or sleep., Disp: , Rfl:    Calcium Carbonate-Vit D-Min (CALCIUM 1200 PO), Take 1 tablet by mouth daily., Disp: , Rfl:    Cholecalciferol (VITAMIN D) 50 MCG (2000 UT) tablet, Take 2,000 Units by mouth daily., Disp: , Rfl:    DULoxetine (CYMBALTA) 30 MG capsule, Take 30 mg by mouth at bedtime., Disp: , Rfl:    esomeprazole (NEXIUM) 40 MG capsule, Take 40 mg by mouth daily at 12 noon., Disp: , Rfl:    fluticasone (FLONASE) 50 MCG/ACT nasal spray, Place 1 spray into both nostrils daily. (Patient taking differently: Place 1 spray into both nostrils daily as needed for allergies.), Disp: 16 g, Rfl: 5   Iron-FA-B Cmp-C-Biot-Probiotic (FUSION PLUS) CAPS, TAKE 1 CAPSULE BY MOUTH MONDAY THROUGH FRIDAYS, Disp: 30 capsule, Rfl: 3   Magnesium 500 MG CAPS, Take 500 mg by mouth daily., Disp: , Rfl:    Omega-3 Fatty Acids (FISH OIL) 1000 MG CAPS, Take 1,000 mg by mouth daily. , Disp: , Rfl:    traZODone (DESYREL) 100 MG tablet, Take 50-100 mg by mouth at bedtime., Disp: , Rfl:    meloxicam (MOBIC) 7.5 MG tablet, Take 7.5 mg by mouth daily. Prescribed 1 a day for two weeks (Patient not taking: Reported on 09/12/2022), Disp: , Rfl:    No Known Allergies   Review of Systems  Constitutional: Negative.  Negative for malaise/fatigue.  Respiratory: Negative.  Negative for shortness of breath.  Cardiovascular: Negative.  Negative for palpitations.  Gastrointestinal: Negative.  Negative for anorexia.  Musculoskeletal:  Negative for myalgias.  Skin:  Negative for pallor.  Neurological: Negative.   Psychiatric/Behavioral: Negative.       Today's Vitals   09/12/22 1143  BP: 124/78  Pulse: 98  Temp: 98.1 F (36.7 C)  Weight: 115 lb 6.4 oz (52.3 kg)  Height: _0  (1.575 m)  PainSc: 0-No pain   Body mass index is 21.11 kg/m.  Wt Readings from Last 3 Encounters:  09/12/22 115 lb 6.4 oz (52.3 kg)  05/02/22 118 lb (53.5 kg)  03/24/22 120 lb 12.8 oz (54.8 kg)     Objective:  Physical Exam Vitals  and nursing note reviewed.  Constitutional:      Appearance: Normal appearance.  HENT:     Head: Normocephalic and atraumatic.     Nose:     Comments: Masked     Mouth/Throat:     Comments: Masked  Eyes:     Extraocular Movements: Extraocular movements intact.  Cardiovascular:     Rate and Rhythm: Normal rate and regular rhythm.     Heart sounds: Normal heart sounds.  Pulmonary:     Effort: Pulmonary effort is normal.     Breath sounds: Normal breath sounds.  Musculoskeletal:     Cervical back: Normal range of motion.  Skin:    General: Skin is warm.  Neurological:     General: No focal deficit present.     Mental Status: She is alert.  Psychiatric:        Mood and Affect: Mood normal.        Behavior: Behavior normal.      Assessment And Plan:     1. Atherosclerosis of aorta (HCC) Comments: Chronic, LDL goal <70. LDL goal 59 in June 2023.  Per Cardiology, statin not needed since she is at goal. She is encouraged to follow a heart healthy lifestyle. - CMP14+EGFR - Amb Referral To Provider Referral Exercise Program (P.R.E.P)  2. Aneurysm of ascending aorta without rupture Maureen Rodgers) Comments: Cardiology notes reviewed.  CT chest 03/2021 4.0 cm. Echo 09/2021 with borderline dilation 48m. Plan for repeat imaging 09/2022  3. Optic nerve edema Comments: I will check labs as below. Results will be forwarded to Dr. GKaty Rodgers - Sed Rate (ESR) - CRP (C-Reactive Protein) - CBC with Diff  4. Diplopia Comments: Pt denies having these sx. This is diagnosis code given by Ophthalmology to order labs as below. - Sed Rate (ESR) - CRP (C-Reactive Protein) - CBC with Diff  5. Alcohol use disorder, moderate, dependence ([place] Comments: She declines referral to ADS. Pt advised drinking 3beers/day is at risk drinking for women. Declines disulfiram/acamprosate at this time.  6. Cigarette nicotine dependence without complication Comments: Importance of cessation d/w patient. Previous LDCT  results reviewed, next one scheduled for Jan 2024. - Amb Referral To Provider Referral Exercise Program (P.R.E.P)  7. BMI 21.0-21.9, adult Comments: She is encouraged to aim for at least 150 minutes of exercise/wk. I will refer her to PREP. - Amb Referral To Provider Referral Exercise Program (P.R.E.P)  8. Immunization due - Flu Vaccine QUAD High Dose(Fluad)  9. Encounter for screening mammogram for malignant neoplasm of breast Comments: She agrees to referral for mammogram, encouraged to perform monthly self breast exams. - MM DIGITAL SCREENING BILATERAL; Future  10. History of anemia Comments: I will check labs as below. - CBC with Diff - Iron, TIBC and Ferritin Panel   Patient  was given opportunity to ask questions. Patient verbalized understanding of the plan and was able to repeat key elements of the plan. All questions were answered to their satisfaction.    I, Maureen Greenland, MD, have reviewed all documentation for this visit. The documentation on 09/17/22 for the exam, diagnosis, procedures, and orders are all accurate and complete.   IF YOU HAVE BEEN REFERRED TO A SPECIALIST, IT MAY TAKE 1-2 WEEKS TO SCHEDULE/PROCESS THE REFERRAL. IF YOU HAVE NOT HEARD FROM US/SPECIALIST IN TWO WEEKS, PLEASE GIVE [place] A CALL AT [phone].   THE PATIENT IS ENCOURAGED TO PRACTICE SOCIAL DISTANCING DUE TO THE COVID-19 PANDEMIC.

## 2022-09-13 LAB — IRON,TIBC AND FERRITIN PANEL
Ferritin: 150 ng/mL (ref 15–150)
Iron Saturation: 48 % (ref 15–55)
Iron: 130 ug/dL (ref 27–139)
Total Iron Binding Capacity: 273 ug/dL (ref 250–450)
UIBC: 143 ug/dL (ref 118–369)

## 2022-09-13 LAB — CMP14+EGFR
ALT: 15 IU/L (ref 0–32)
AST: 17 IU/L (ref 0–40)
Albumin/Globulin Ratio: 2 (ref 1.2–2.2)
Albumin: 4.5 g/dL (ref 3.8–4.8)
Alkaline Phosphatase: 65 IU/L (ref 44–121)
BUN/Creatinine Ratio: 6 — ABNORMAL LOW (ref 12–28)
BUN: 4 mg/dL — ABNORMAL LOW (ref 8–27)
Bilirubin Total: <0.2 mg/dL (ref 0.0–1.2)
CO2: 23 mmol/L (ref 20–29)
Calcium: 9.8 mg/dL (ref 8.7–10.3)
Chloride: 98 mmol/L (ref 96–106)
Creatinine, Ser: 0.67 mg/dL (ref 0.57–1.00)
Globulin, Total: 2.3 g/dL (ref 1.5–4.5)
Glucose: 164 mg/dL — ABNORMAL HIGH (ref 70–99)
Potassium: 4.6 mmol/L (ref 3.5–5.2)
Sodium: 138 mmol/L (ref 134–144)
Total Protein: 6.8 g/dL (ref 6.0–8.5)
eGFR: 93 mL/min/{1.73_m2} (ref >59)

## 2022-09-13 LAB — CBC WITH DIFFERENTIAL/PLATELET
Basophils Absolute: 0 10*3/uL (ref 0.0–0.2)
Basos: 1 %
EOS (ABSOLUTE): 0 10*3/uL (ref 0.0–0.4)
Eos: 1 %
Hematocrit: 42.9 % (ref 34.0–46.6)
Hemoglobin: 14.9 g/dL (ref 11.1–15.9)
Immature Grans (Abs): 0 10*3/uL (ref 0.0–0.1)
Immature Granulocytes: 0 %
Lymphocytes Absolute: 1.8 10*3/uL (ref 0.7–3.1)
Lymphs: 43 %
MCH: 32.4 pg (ref 26.6–33.0)
MCHC: 34.7 g/dL (ref 31.5–35.7)
MCV: 93 fL (ref 79–97)
Monocytes Absolute: 0.3 10*3/uL (ref 0.1–0.9)
Monocytes: 6 %
Neutrophils Absolute: 2.1 10*3/uL (ref 1.4–7.0)
Neutrophils: 49 %
Platelets: 433 10*3/uL (ref 150–450)
RBC: 4.6 x10E6/uL (ref 3.77–5.28)
RDW: 11.8 % (ref 11.7–15.4)
WBC: 4.2 10*3/uL (ref 3.4–10.8)

## 2022-09-13 LAB — C-REACTIVE PROTEIN: CRP: 10 mg/L (ref 0–10)

## 2022-09-13 LAB — SEDIMENTATION RATE: Sed Rate: 19 mm/hr (ref 0–40)

## 2022-09-14 DIAGNOSIS — R519 Headache, unspecified: Secondary | ICD-10-CM | POA: Diagnosis not present

## 2022-09-14 DIAGNOSIS — H5711 Ocular pain, right eye: Secondary | ICD-10-CM | POA: Diagnosis not present

## 2022-09-14 DIAGNOSIS — H5351 Achromatopsia: Secondary | ICD-10-CM | POA: Diagnosis not present

## 2022-09-14 DIAGNOSIS — H04123 Dry eye syndrome of bilateral lacrimal glands: Secondary | ICD-10-CM | POA: Diagnosis not present

## 2022-09-15 DIAGNOSIS — F411 Generalized anxiety disorder: Secondary | ICD-10-CM | POA: Diagnosis not present

## 2022-09-17 ENCOUNTER — Encounter: Payer: Self-pay | Admitting: Internal Medicine

## 2022-09-19 DIAGNOSIS — F331 Major depressive disorder, recurrent, moderate: Secondary | ICD-10-CM | POA: Diagnosis not present

## 2022-09-19 DIAGNOSIS — F411 Generalized anxiety disorder: Secondary | ICD-10-CM | POA: Diagnosis not present

## 2022-09-19 LAB — LIPID PANEL
Chol/HDL Ratio: 2 ratio (ref 0.0–4.4)
Cholesterol, Total: 188 mg/dL (ref 100–199)
HDL: 92 mg/dL (ref >39)
LDL Chol Calc (NIH): 83 mg/dL (ref 0–99)
Triglycerides: 69 mg/dL (ref 0–149)
VLDL Cholesterol Cal: 13 mg/dL (ref 5–40)

## 2022-09-19 LAB — SPECIMEN STATUS REPORT

## 2022-10-04 ENCOUNTER — Ambulatory Visit
Admission: RE | Admit: 2022-10-04 | Discharge: 2022-10-04 | Disposition: A | Discharge status: Home / Self Care | Payer: Medicare Other | Source: Ambulatory Visit | Attending: Internal Medicine | Admitting: Internal Medicine

## 2022-10-04 DIAGNOSIS — F17210 Nicotine dependence, cigarettes, uncomplicated: Secondary | ICD-10-CM

## 2022-10-04 DIAGNOSIS — D3502 Benign neoplasm of left adrenal gland: Secondary | ICD-10-CM | POA: Diagnosis not present

## 2022-10-04 DIAGNOSIS — I2510 Atherosclerotic heart disease of native coronary artery without angina pectoris: Secondary | ICD-10-CM | POA: Diagnosis not present

## 2022-10-04 DIAGNOSIS — E041 Nontoxic single thyroid nodule: Secondary | ICD-10-CM | POA: Diagnosis not present

## 2022-10-11 DIAGNOSIS — F411 Generalized anxiety disorder: Secondary | ICD-10-CM | POA: Diagnosis not present

## 2022-10-21 ENCOUNTER — Other Ambulatory Visit: Payer: Self-pay | Admitting: Internal Medicine

## 2022-11-08 DIAGNOSIS — F411 Generalized anxiety disorder: Secondary | ICD-10-CM | POA: Diagnosis not present

## 2022-11-14 DIAGNOSIS — H5711 Ocular pain, right eye: Secondary | ICD-10-CM | POA: Diagnosis not present

## 2022-11-14 DIAGNOSIS — H04123 Dry eye syndrome of bilateral lacrimal glands: Secondary | ICD-10-CM | POA: Diagnosis not present

## 2022-11-14 DIAGNOSIS — Z961 Presence of intraocular lens: Secondary | ICD-10-CM | POA: Diagnosis not present

## 2022-11-17 ENCOUNTER — Other Ambulatory Visit (HOSPITAL_BASED_OUTPATIENT_CLINIC_OR_DEPARTMENT_OTHER): Payer: Self-pay | Admitting: Gastroenterology

## 2022-11-17 ENCOUNTER — Encounter: Payer: Self-pay | Admitting: Gastroenterology

## 2022-11-17 DIAGNOSIS — R1033 Periumbilical pain: Secondary | ICD-10-CM | POA: Diagnosis not present

## 2022-11-17 DIAGNOSIS — K5730 Diverticulosis of large intestine without perforation or abscess without bleeding: Secondary | ICD-10-CM | POA: Diagnosis not present

## 2022-11-17 DIAGNOSIS — K449 Diaphragmatic hernia without obstruction or gangrene: Secondary | ICD-10-CM | POA: Diagnosis not present

## 2022-11-17 DIAGNOSIS — K219 Gastro-esophageal reflux disease without esophagitis: Secondary | ICD-10-CM | POA: Diagnosis not present

## 2022-11-17 DIAGNOSIS — R634 Abnormal weight loss: Secondary | ICD-10-CM | POA: Diagnosis not present

## 2022-11-17 DIAGNOSIS — Z86010 Personal history of colonic polyps: Secondary | ICD-10-CM | POA: Diagnosis not present

## 2022-11-18 ENCOUNTER — Ambulatory Visit (HOSPITAL_COMMUNITY)
Admission: RE | Admit: 2022-11-18 | Discharge: 2022-11-18 | Disposition: A | Discharge status: Home / Self Care | Payer: Medicare Other | Source: Ambulatory Visit | Attending: Gastroenterology | Admitting: Gastroenterology

## 2022-11-18 ENCOUNTER — Other Ambulatory Visit: Payer: Self-pay

## 2022-11-18 ENCOUNTER — Inpatient Hospital Stay (HOSPITAL_COMMUNITY)
Admission: EM | Admit: 2022-11-18 | Discharge: 2022-11-22 | DRG: 392 | Disposition: A | Discharge status: Home / Self Care | Payer: Medicare Other | Attending: Family Medicine | Admitting: Family Medicine

## 2022-11-18 ENCOUNTER — Telehealth: Payer: Self-pay | Admitting: Gastroenterology

## 2022-11-18 DIAGNOSIS — R1033 Periumbilical pain: Secondary | ICD-10-CM

## 2022-11-18 DIAGNOSIS — K219 Gastro-esophageal reflux disease without esophagitis: Secondary | ICD-10-CM | POA: Diagnosis present

## 2022-11-18 DIAGNOSIS — Z811 Family history of alcohol abuse and dependence: Secondary | ICD-10-CM

## 2022-11-18 DIAGNOSIS — Z79899 Other long term (current) drug therapy: Secondary | ICD-10-CM

## 2022-11-18 DIAGNOSIS — I7121 Aneurysm of the ascending aorta, without rupture: Secondary | ICD-10-CM | POA: Diagnosis present

## 2022-11-18 DIAGNOSIS — F331 Major depressive disorder, recurrent, moderate: Secondary | ICD-10-CM | POA: Diagnosis present

## 2022-11-18 DIAGNOSIS — Z8249 Family history of ischemic heart disease and other diseases of the circulatory system: Secondary | ICD-10-CM

## 2022-11-18 DIAGNOSIS — E782 Mixed hyperlipidemia: Secondary | ICD-10-CM | POA: Diagnosis present

## 2022-11-18 DIAGNOSIS — E860 Dehydration: Secondary | ICD-10-CM | POA: Diagnosis present

## 2022-11-18 DIAGNOSIS — K5720 Diverticulitis of large intestine with perforation and abscess without bleeding: Principal | ICD-10-CM

## 2022-11-18 DIAGNOSIS — F1020 Alcohol dependence, uncomplicated: Secondary | ICD-10-CM | POA: Diagnosis present

## 2022-11-18 DIAGNOSIS — E871 Hypo-osmolality and hyponatremia: Secondary | ICD-10-CM | POA: Insufficient documentation

## 2022-11-18 DIAGNOSIS — F419 Anxiety disorder, unspecified: Secondary | ICD-10-CM | POA: Diagnosis present

## 2022-11-18 DIAGNOSIS — Z833 Family history of diabetes mellitus: Secondary | ICD-10-CM

## 2022-11-18 DIAGNOSIS — K5730 Diverticulosis of large intestine without perforation or abscess without bleeding: Secondary | ICD-10-CM | POA: Diagnosis not present

## 2022-11-18 DIAGNOSIS — N281 Cyst of kidney, acquired: Secondary | ICD-10-CM | POA: Diagnosis not present

## 2022-11-18 DIAGNOSIS — Z1152 Encounter for screening for COVID-19: Secondary | ICD-10-CM

## 2022-11-18 DIAGNOSIS — I10 Essential (primary) hypertension: Secondary | ICD-10-CM | POA: Diagnosis present

## 2022-11-18 DIAGNOSIS — F17210 Nicotine dependence, cigarettes, uncomplicated: Secondary | ICD-10-CM | POA: Diagnosis present

## 2022-11-18 DIAGNOSIS — R001 Bradycardia, unspecified: Secondary | ICD-10-CM

## 2022-11-18 LAB — CBC WITH DIFFERENTIAL/PLATELET
Abs Immature Granulocytes: 0.03 10*3/uL (ref 0.00–0.07)
Basophils Absolute: 0 10*3/uL (ref 0.0–0.1)
Basophils Relative: 1 %
Eosinophils Absolute: 0.2 10*3/uL (ref 0.0–0.5)
Eosinophils Relative: 2 %
HCT: 38 % (ref 36.0–46.0)
Hemoglobin: 13.1 g/dL (ref 12.0–15.0)
Immature Granulocytes: 0 %
Lymphocytes Relative: 34 %
Lymphs Abs: 2.5 10*3/uL (ref 0.7–4.0)
MCH: 32.5 pg (ref 26.0–34.0)
MCHC: 34.5 g/dL (ref 30.0–36.0)
MCV: 94.3 fL (ref 80.0–100.0)
Monocytes Absolute: 0.6 10*3/uL (ref 0.1–1.0)
Monocytes Relative: 8 %
Neutro Abs: 4 10*3/uL (ref 1.7–7.7)
Neutrophils Relative %: 55 %
Platelets: 380 10*3/uL (ref 150–400)
RBC: 4.03 MIL/uL (ref 3.87–5.11)
RDW: 12.3 % (ref 11.5–15.5)
WBC: 7.2 10*3/uL (ref 4.0–10.5)
nRBC: 0 % (ref 0.0–0.2)

## 2022-11-18 LAB — COMPREHENSIVE METABOLIC PANEL
ALT: 13 U/L (ref 0–44)
AST: 13 U/L — ABNORMAL LOW (ref 15–41)
Albumin: 3.5 g/dL (ref 3.5–5.0)
Alkaline Phosphatase: 54 U/L (ref 38–126)
Anion gap: 11 (ref 5–15)
BUN: 5 mg/dL — ABNORMAL LOW (ref 8–23)
CO2: 21 mmol/L — ABNORMAL LOW (ref 22–32)
Calcium: 8.7 mg/dL — ABNORMAL LOW (ref 8.9–10.3)
Chloride: 93 mmol/L — ABNORMAL LOW (ref 98–111)
Creatinine, Ser: 0.53 mg/dL (ref 0.44–1.00)
GFR, Estimated: >60 mL/min (ref >60)
Glucose, Bld: 86 mg/dL (ref 70–99)
Potassium: 3.6 mmol/L (ref 3.5–5.1)
Sodium: 125 mmol/L — ABNORMAL LOW (ref 135–145)
Total Bilirubin: 0.2 mg/dL — ABNORMAL LOW (ref 0.3–1.2)
Total Protein: 6.8 g/dL (ref 6.5–8.1)

## 2022-11-18 LAB — PROTIME-INR
INR: 1 (ref 0.8–1.2)
Prothrombin Time: 13.2 seconds (ref 11.4–15.2)

## 2022-11-18 LAB — LACTIC ACID, PLASMA: Lactic Acid, Venous: 1.1 mmol/L (ref 0.5–1.9)

## 2022-11-18 LAB — APTT: aPTT: 37 seconds — ABNORMAL HIGH (ref 24–36)

## 2022-11-18 MED ORDER — LACTATED RINGERS IV BOLUS (SEPSIS)
1000.0000 mL | Freq: Once | INTRAVENOUS | Status: AC
  Administered 2022-11-18: 1000 mL via INTRAVENOUS

## 2022-11-18 MED ORDER — IOHEXOL 300 MG/ML  SOLN
100.0000 mL | Freq: Once | INTRAMUSCULAR | Status: AC | PRN
  Administered 2022-11-18: 100 mL via INTRAVENOUS

## 2022-11-18 MED ORDER — SODIUM CHLORIDE 0.9 % IV SOLN
2.0000 g | Freq: Once | INTRAVENOUS | Status: AC
  Administered 2022-11-18: 2 g via INTRAVENOUS
  Filled 2022-11-18: qty 12.5

## 2022-11-18 MED ORDER — IOHEXOL 9 MG/ML PO SOLN
ORAL | Status: AC
  Administered 2022-11-18: 1000 mL
  Filled 2022-11-18: qty 1000

## 2022-11-18 MED ORDER — METRONIDAZOLE 500 MG/100ML IV SOLN
500.0000 mg | Freq: Once | INTRAVENOUS | Status: AC
  Administered 2022-11-18: 500 mg via INTRAVENOUS
  Filled 2022-11-18: qty 100

## 2022-11-18 MED ORDER — LACTATED RINGERS IV SOLN: INTRAVENOUS | Status: DC

## 2022-11-18 NOTE — ED Notes (Signed)
No answer for triage x1

## 2022-11-18 NOTE — Telephone Encounter (Signed)
Received a call from radiology about CT scan ordered by Dr. Lorie Apley office for this patient.  CT scan done for abdominal pain showing diverticulitis with a contained perforation. I tried calling the patients home phone, no answer, left a message explaining results and recommended she go to the ED for further evaluation. I called her cell phone twice, no answer, left similar message.   I then called patient contacts all 3 in the system - no answer on any of them.   She needs to go to the ED for further evaluation and likely admission given CT scan results. I explained this in my messages, will try to call her later as well to see if we can get ahold of her.  Maureen Rodgers, trying to get ahold of this patient

## 2022-11-18 NOTE — Telephone Encounter (Signed)
Called the patient back this evening, finally got ahold of her.  She was seen by Dr. Collene Mares yesterday for abdominal pain, states she has had this in the past.  Relayed results of CT Scan with her. She has complicated diverticulitis with a contained perforation.  She has ongoing abdominal pain. I recommend she go to the ED tonight for evaluation and admission to manage this. I discussed my concerns about her findings, risks for severe infection, she understands and will go to the ED tonight for further evaluation / admission

## 2022-11-18 NOTE — ED Triage Notes (Signed)
Pt via POV. Follow up on CT abd d/t abdominal pain. Found to have complicated diverticulitis with a contained perforation. Recommended ED visit with admission

## 2022-11-19 ENCOUNTER — Encounter (HOSPITAL_COMMUNITY): Payer: Self-pay | Admitting: Internal Medicine

## 2022-11-19 DIAGNOSIS — I7121 Aneurysm of the ascending aorta, without rupture: Secondary | ICD-10-CM | POA: Diagnosis present

## 2022-11-19 DIAGNOSIS — Z79899 Other long term (current) drug therapy: Secondary | ICD-10-CM | POA: Diagnosis not present

## 2022-11-19 DIAGNOSIS — Z1152 Encounter for screening for COVID-19: Secondary | ICD-10-CM | POA: Diagnosis not present

## 2022-11-19 DIAGNOSIS — K5720 Diverticulitis of large intestine with perforation and abscess without bleeding: Secondary | ICD-10-CM | POA: Diagnosis not present

## 2022-11-19 DIAGNOSIS — K219 Gastro-esophageal reflux disease without esophagitis: Secondary | ICD-10-CM | POA: Diagnosis present

## 2022-11-19 DIAGNOSIS — F17210 Nicotine dependence, cigarettes, uncomplicated: Secondary | ICD-10-CM | POA: Diagnosis present

## 2022-11-19 DIAGNOSIS — E871 Hypo-osmolality and hyponatremia: Secondary | ICD-10-CM

## 2022-11-19 DIAGNOSIS — F1020 Alcohol dependence, uncomplicated: Secondary | ICD-10-CM

## 2022-11-19 DIAGNOSIS — E782 Mixed hyperlipidemia: Secondary | ICD-10-CM | POA: Diagnosis present

## 2022-11-19 DIAGNOSIS — I10 Essential (primary) hypertension: Secondary | ICD-10-CM | POA: Diagnosis present

## 2022-11-19 DIAGNOSIS — Z811 Family history of alcohol abuse and dependence: Secondary | ICD-10-CM | POA: Diagnosis not present

## 2022-11-19 DIAGNOSIS — Z833 Family history of diabetes mellitus: Secondary | ICD-10-CM | POA: Diagnosis not present

## 2022-11-19 DIAGNOSIS — Z8249 Family history of ischemic heart disease and other diseases of the circulatory system: Secondary | ICD-10-CM | POA: Diagnosis not present

## 2022-11-19 DIAGNOSIS — R001 Bradycardia, unspecified: Secondary | ICD-10-CM | POA: Diagnosis not present

## 2022-11-19 DIAGNOSIS — F331 Major depressive disorder, recurrent, moderate: Secondary | ICD-10-CM | POA: Diagnosis present

## 2022-11-19 DIAGNOSIS — E860 Dehydration: Secondary | ICD-10-CM | POA: Diagnosis present

## 2022-11-19 DIAGNOSIS — F419 Anxiety disorder, unspecified: Secondary | ICD-10-CM | POA: Diagnosis present

## 2022-11-19 LAB — CBC WITH DIFFERENTIAL/PLATELET
Abs Immature Granulocytes: 0.03 10*3/uL (ref 0.00–0.07)
Basophils Absolute: 0.1 10*3/uL (ref 0.0–0.1)
Basophils Relative: 1 %
Eosinophils Absolute: 0.2 10*3/uL (ref 0.0–0.5)
Eosinophils Relative: 3 %
HCT: 36.9 % (ref 36.0–46.0)
Hemoglobin: 12.6 g/dL (ref 12.0–15.0)
Immature Granulocytes: 1 %
Lymphocytes Relative: 45 %
Lymphs Abs: 2.4 10*3/uL (ref 0.7–4.0)
MCH: 32.5 pg (ref 26.0–34.0)
MCHC: 34.1 g/dL (ref 30.0–36.0)
MCV: 95.1 fL (ref 80.0–100.0)
Monocytes Absolute: 0.4 10*3/uL (ref 0.1–1.0)
Monocytes Relative: 7 %
Neutro Abs: 2.3 10*3/uL (ref 1.7–7.7)
Neutrophils Relative %: 43 %
Platelets: 400 10*3/uL (ref 150–400)
RBC: 3.88 MIL/uL (ref 3.87–5.11)
RDW: 12.3 % (ref 11.5–15.5)
WBC: 5.2 10*3/uL (ref 4.0–10.5)
nRBC: 0 % (ref 0.0–0.2)

## 2022-11-19 LAB — BASIC METABOLIC PANEL
Anion gap: 10 (ref 5–15)
Anion gap: 12 (ref 5–15)
Anion gap: 11 (ref 5–15)
BUN: <5 mg/dL — ABNORMAL LOW (ref 8–23)
BUN: <5 mg/dL — ABNORMAL LOW (ref 8–23)
BUN: 5 mg/dL — ABNORMAL LOW (ref 8–23)
CO2: 25 mmol/L (ref 22–32)
CO2: 24 mmol/L (ref 22–32)
CO2: 22 mmol/L (ref 22–32)
Calcium: 8.8 mg/dL — ABNORMAL LOW (ref 8.9–10.3)
Calcium: 8.9 mg/dL (ref 8.9–10.3)
Calcium: 8.4 mg/dL — ABNORMAL LOW (ref 8.9–10.3)
Chloride: 100 mmol/L (ref 98–111)
Chloride: 99 mmol/L (ref 98–111)
Chloride: 94 mmol/L — ABNORMAL LOW (ref 98–111)
Creatinine, Ser: 0.65 mg/dL (ref 0.44–1.00)
Creatinine, Ser: 0.6 mg/dL (ref 0.44–1.00)
Creatinine, Ser: 0.77 mg/dL (ref 0.44–1.00)
GFR, Estimated: >60 mL/min (ref >60)
GFR, Estimated: >60 mL/min (ref >60)
GFR, Estimated: >60 mL/min (ref >60)
Glucose, Bld: 88 mg/dL (ref 70–99)
Glucose, Bld: 90 mg/dL (ref 70–99)
Glucose, Bld: 90 mg/dL (ref 70–99)
Potassium: 3.7 mmol/L (ref 3.5–5.1)
Potassium: 3.9 mmol/L (ref 3.5–5.1)
Potassium: 3.7 mmol/L (ref 3.5–5.1)
Sodium: 135 mmol/L (ref 135–145)
Sodium: 135 mmol/L (ref 135–145)
Sodium: 127 mmol/L — ABNORMAL LOW (ref 135–145)

## 2022-11-19 LAB — RESP PANEL BY RT-PCR (RSV, FLU A&B, COVID)  RVPGX2
Influenza A by PCR: NEGATIVE
Influenza B by PCR: NEGATIVE
Resp Syncytial Virus by PCR: NEGATIVE
SARS Coronavirus 2 by RT PCR: NEGATIVE

## 2022-11-19 LAB — HEPATIC FUNCTION PANEL
ALT: 12 U/L (ref 0–44)
AST: 18 U/L (ref 15–41)
Albumin: 3.3 g/dL — ABNORMAL LOW (ref 3.5–5.0)
Alkaline Phosphatase: 47 U/L (ref 38–126)
Bilirubin, Direct: 0.1 mg/dL (ref 0.0–0.2)
Indirect Bilirubin: 0.3 mg/dL (ref 0.3–0.9)
Total Bilirubin: 0.4 mg/dL (ref 0.3–1.2)
Total Protein: 6 g/dL — ABNORMAL LOW (ref 6.5–8.1)

## 2022-11-19 LAB — URINALYSIS, ROUTINE W REFLEX MICROSCOPIC
Bilirubin Urine: NEGATIVE
Glucose, UA: NEGATIVE mg/dL
Hgb urine dipstick: NEGATIVE
Ketones, ur: NEGATIVE mg/dL
Leukocytes,Ua: NEGATIVE
Nitrite: NEGATIVE
Protein, ur: NEGATIVE mg/dL
Specific Gravity, Urine: 1.011 (ref 1.005–1.030)
pH: 6 (ref 5.0–8.0)

## 2022-11-19 LAB — CBG MONITORING, ED
Glucose-Capillary: 103 mg/dL — ABNORMAL HIGH (ref 70–99)
Glucose-Capillary: 103 mg/dL — ABNORMAL HIGH (ref 70–99)

## 2022-11-19 LAB — LACTIC ACID, PLASMA: Lactic Acid, Venous: 0.9 mmol/L (ref 0.5–1.9)

## 2022-11-19 MED ORDER — TRAZODONE HCL 100 MG PO TABS
100.0000 mg | ORAL_TABLET | Freq: Every day | ORAL | Status: DC
  Administered 2022-11-19 – 2022-11-21 (×3): 100 mg via ORAL
  Filled 2022-11-19 (×4): qty 1

## 2022-11-19 MED ORDER — FENTANYL CITRATE PF 50 MCG/ML IJ SOSY
25.0000 ug | PREFILLED_SYRINGE | Freq: Once | INTRAMUSCULAR | Status: AC
  Administered 2022-11-19: 25 ug via INTRAVENOUS
  Filled 2022-11-19: qty 1

## 2022-11-19 MED ORDER — FOLIC ACID 1 MG PO TABS: 1.0000 mg | ORAL_TABLET | Freq: Every day | ORAL | Status: DC

## 2022-11-19 MED ORDER — ADULT MULTIVITAMIN W/MINERALS CH: 1.0000 | ORAL_TABLET | Freq: Every day | ORAL | Status: DC

## 2022-11-19 MED ORDER — THIAMINE HCL 100 MG/ML IJ SOLN: 100.0000 mg | Freq: Every day | INTRAMUSCULAR | Status: DC

## 2022-11-19 MED ORDER — PIPERACILLIN-TAZOBACTAM 3.375 G IVPB
3.3750 g | Freq: Three times a day (TID) | INTRAVENOUS | Status: DC
  Administered 2022-11-19 – 2022-11-22 (×10): 3.375 g via INTRAVENOUS
  Filled 2022-11-19 (×10): qty 50

## 2022-11-19 MED ORDER — DULOXETINE HCL 60 MG PO CPEP
60.0000 mg | ORAL_CAPSULE | Freq: Every day | ORAL | Status: DC
  Administered 2022-11-19 – 2022-11-21 (×3): 60 mg via ORAL
  Filled 2022-11-19 (×3): qty 1

## 2022-11-19 MED ORDER — LORAZEPAM 2 MG/ML IJ SOLN
1.0000 mg | INTRAMUSCULAR | Status: AC | PRN
  Administered 2022-11-21: 1 mg via INTRAVENOUS
  Filled 2022-11-19: qty 1

## 2022-11-19 MED ORDER — LORAZEPAM 2 MG/ML IJ SOLN: 0.0000 mg | Freq: Two times a day (BID) | INTRAMUSCULAR | Status: DC

## 2022-11-19 MED ORDER — THIAMINE MONONITRATE 100 MG PO TABS
100.0000 mg | ORAL_TABLET | Freq: Every day | ORAL | Status: DC
  Administered 2022-11-19 – 2022-11-22 (×4): 100 mg via ORAL
  Filled 2022-11-19 (×4): qty 1

## 2022-11-19 MED ORDER — LACTATED RINGERS IV SOLN: INTRAVENOUS | Status: AC

## 2022-11-19 MED ORDER — FOLIC ACID 1 MG PO TABS
1.0000 mg | ORAL_TABLET | Freq: Every day | ORAL | Status: DC
  Administered 2022-11-19 – 2022-11-22 (×4): 1 mg via ORAL
  Filled 2022-11-19 (×4): qty 1

## 2022-11-19 MED ORDER — FENTANYL CITRATE PF 50 MCG/ML IJ SOSY: 25.0000 ug | PREFILLED_SYRINGE | INTRAMUSCULAR | Status: DC | PRN

## 2022-11-19 MED ORDER — LORAZEPAM 1 MG PO TABS: 1.0000 mg | ORAL_TABLET | ORAL | Status: DC | PRN

## 2022-11-19 MED ORDER — THIAMINE MONONITRATE 100 MG PO TABS: 100.0000 mg | ORAL_TABLET | Freq: Every day | ORAL | Status: DC

## 2022-11-19 MED ORDER — AMLODIPINE BESYLATE 5 MG PO TABS
5.0000 mg | ORAL_TABLET | Freq: Every day | ORAL | Status: DC
  Administered 2022-11-19 – 2022-11-22 (×4): 5 mg via ORAL
  Filled 2022-11-19 (×4): qty 1

## 2022-11-19 MED ORDER — ADULT MULTIVITAMIN W/MINERALS CH
1.0000 | ORAL_TABLET | Freq: Every day | ORAL | Status: DC
  Administered 2022-11-19 – 2022-11-22 (×4): 1 via ORAL
  Filled 2022-11-19 (×4): qty 1

## 2022-11-19 MED ORDER — LORAZEPAM 2 MG/ML PO CONC: 1.0000 mg | ORAL | Status: DC | PRN

## 2022-11-19 MED ORDER — LORAZEPAM 2 MG/ML IJ SOLN: 0.0000 mg | Freq: Four times a day (QID) | INTRAMUSCULAR | Status: AC

## 2022-11-19 MED ORDER — PANTOPRAZOLE SODIUM 40 MG PO TBEC
40.0000 mg | DELAYED_RELEASE_TABLET | Freq: Every day | ORAL | Status: DC
  Administered 2022-11-19 – 2022-11-22 (×4): 40 mg via ORAL
  Filled 2022-11-19 (×4): qty 1

## 2022-11-19 MED ORDER — LORAZEPAM 1 MG PO TABS
1.0000 mg | ORAL_TABLET | ORAL | Status: AC | PRN
  Administered 2022-11-19 – 2022-11-21 (×9): 1 mg via ORAL
  Filled 2022-11-19 (×9): qty 1

## 2022-11-19 NOTE — Hospital Course (Signed)
Maureen Rodgers is a 72 y.o. female with a history of depression, anxiety, alcohol abuse, hypertension, diverticulitis. Patient presented secondary to abdominal pain and was found to have evidence of acute diverticulitis with associated contained perforation. General surgery consulted. Conservative management.

## 2022-11-19 NOTE — ED Notes (Signed)
Patient ambulatory to restroom with steady gait.

## 2022-11-19 NOTE — Progress Notes (Signed)
A consult was received from an ED physician for Cefepime per pharmacy dosing.  The patient's profile has been reviewed for ht/wt/allergies/indication/available labs.    A one time order has been placed for Cefepime 2gm IV.    Further antibiotics/pharmacy consults should be ordered by admitting physician if indicated.                       Thank you, Everette Rank, PharmD 11/19/2022  1:09 AM

## 2022-11-19 NOTE — H&P (Signed)
History and Physical    Maureen Rodgers [id number] DOB: 01-21-1951 DOA: 11/18/2022  Patient coming from: Home.  Chief Complaint: Abdominal pain.  HPI: Maureen Rodgers is a 72 y.o. female with history of depression anxiety alcohol abuse presents to the ER because of abdominal pain.  Patient states about 2 weeks ago she started having left lower quadrant abdomen pain which was severe at first but later became more mild.  But over the last 24 hours it acutely worsened had gone to a gastroenterologist Dr. Collene Mares who advised her to come to the ER.  Denies any nausea vomiting.  Has been having some loose stools but not definite diarrhea.  ED Course: In the ER CT scan of the abdomen pelvis showed sigmoid diverticulitis with possible contained perforation.  On-call general surgeon Dr. Malachi Paradise was consulted.  Patient was started on empiric antibiotics.  Labs are significant for hyponatremia.  Review of Systems: As per HPI, rest all negative.   Past Medical History:  Diagnosis Date   Ascending aortic aneurysm ([place]) 11/29/2021   Depression    Essential hypertension 11/29/2021   GERD (gastroesophageal reflux disease)    Tobacco use     Past Surgical History:  Procedure Laterality Date   ABDOMINAL HYSTERECTOMY     BILATERAL CARPAL TUNNEL RELEASE  2018   2018 on Right, many years ago on left   COLONOSCOPY WITH PROPOFOL N/A 11/26/2021   Procedure: COLONOSCOPY WITH PROPOFOL;  Surgeon: Carol Ada, MD;  Location: [facility];  Service: Endoscopy;  Laterality: N/A;   ESOPHAGOGASTRODUODENOSCOPY (EGD) WITH PROPOFOL N/A 11/26/2021   Procedure: ESOPHAGOGASTRODUODENOSCOPY (EGD) WITH PROPOFOL;  Surgeon: Carol Ada, MD;  Location: [facility];  Service: Endoscopy;  Laterality: N/A;   EYE SURGERY     HEMOSTASIS CLIP PLACEMENT  11/26/2021   Procedure: HEMOSTASIS CLIP PLACEMENT;  Surgeon: Carol Ada, MD;  Location: [facility];  Service: Endoscopy;;   HOT HEMOSTASIS N/A 11/26/2021   Procedure: HOT  HEMOSTASIS (ARGON PLASMA COAGULATION/BICAP);  Surgeon: Carol Ada, MD;  Location: [facility];  Service: Endoscopy;  Laterality: N/A;  EGD and COLON   INCISION AND DRAINAGE PERIRECTAL ABSCESS N/A 02/27/2020   Procedure: IRRIGATION AND DEBRIDEMENT PERIRECTAL ABSCESS;  Surgeon: Donnie Mesa, MD;  Location: [facility];  Service: General;  Laterality: N/A;   IRRIGATION AND DEBRIDEMENT ABSCESS  02/27/2020   Procedure(s) Performed: [place] (N/A Perineum)   POLYPECTOMY  11/26/2021   Procedure: POLYPECTOMY;  Surgeon: Carol Ada, MD;  Location: [facility];  Service: Endoscopy;;     reports that she has been smoking cigarettes. She started smoking about 48 years ago. She has a 33.00 pack-year smoking history. She has never used smokeless tobacco. She reports current alcohol use of about 2.0 standard drinks of alcohol per week. She reports that she does not use drugs.  No Known Allergies  Family History  Problem Relation Age of Onset   Heart disease Mother    Dementia Mother    Alcohol abuse Mother    Alcohol abuse Father    Heart disease Brother    Diabetes Brother    Heart attack Maternal Grandmother     Prior to Admission medications   Medication Sig Start Date End Date Taking? Authorizing Provider  alendronate (FOSAMAX) 70 MG tablet Take 1 tablet (70 mg total) by mouth every Sunday. 10/23/22  Yes Glendale Chard, MD  ALPRAZolam Duanne Moron) 1 MG tablet Take 1 mg by mouth 3 (three) times daily as needed for anxiety or sleep.  Yes [provider]  Calcium Carbonate-Vit D-Min (CALCIUM 1200 PO) Take 1 tablet by mouth daily.   Yes [provider]  Carboxymethylcellulose Sodium (EYE DROPS OP) Place 1 drop into both eyes 3 (three) times daily as needed (dry eyes).   Yes [provider]  DULoxetine (CYMBALTA) 60 MG capsule Take 60 mg by mouth at bedtime.   Yes [provider]  esomeprazole (NEXIUM) 40 MG capsule Take 40 mg by  mouth daily at 12 noon.   Yes [provider]  fluticasone (FLONASE) 50 MCG/ACT nasal spray Place 1 spray into both nostrils daily. Patient taking differently: Place 1 spray into both nostrils daily as needed for allergies. 09/07/18  Yes Minette Brine, FNP  Iron-FA-B Cmp-C-Biot-Probiotic (FUSION PLUS) CAPS TAKE 1 CAPSULE BY MOUTH MONDAY THROUGH [facility] 08/22/22  Yes Glendale Chard, MD  Magnesium 400 MG TABS Take 1 tablet by mouth daily.   Yes [provider]  Omega-3 Fatty Acids (FISH OIL) 1000 MG CAPS Take 1,000 mg by mouth daily.    Yes [provider]  traZODone (DESYREL) 100 MG tablet Take 100 mg by mouth at bedtime.   Yes [provider]    Physical Exam: Constitutional: Moderately built and nourished. Vitals:   11/18/22 2226 11/18/22 2237 11/19/22 0239 11/19/22 0500  BP: 119/77 129/89  (!) 154/89  Pulse: 85 73  (!) 50  Resp: 16 16  20  $ Temp: 98 F (36.7 C) (!) 97.4 F (36.3 C) 97.9 F (36.6 C)   TempSrc: Oral Axillary Oral   SpO2: 97% 96%  100%   Eyes: Anicteric no pallor. ENMT: No discharge from the ears eyes nose and mouth. Neck: No mass felt.  No neck rigidity. Respiratory: No rhonchi or crepitations. Cardiovascular: S1-S2 heard. Abdomen: Soft tenderness in the left lower quadrant. Musculoskeletal: No edema. Skin: No rash. Neurologic: Alert awake oriented time place and person.  Moves all extremities. Psychiatric: Appears normal.  Normal affect.   Labs on Admission: I have personally reviewed following labs and imaging studies  CBC: Recent Labs  Lab 11/18/22 2300  WBC 7.2  NEUTROABS 4.0  HGB 13.1  HCT 38.0  MCV 94.3  PLT [id number]   Basic Metabolic Panel: Recent Labs  Lab 11/18/22 2300  NA 125*  K 3.6  CL 93*  CO2 21*  GLUCOSE 86  BUN 5*  CREATININE 0.53  CALCIUM 8.7*   GFR: CrCl cannot be calculated (Unknown ideal weight.). Liver Function Tests: Recent Labs  Lab 11/18/22 2300  AST 13*  ALT 13  ALKPHOS 54   BILITOT 0.2*  PROT 6.8  ALBUMIN 3.5   No results for input(s): "LIPASE", "AMYLASE" in the last 168 hours. No results for input(s): "AMMONIA" in the last 168 hours. Coagulation Profile: Recent Labs  Lab 11/18/22 2300  INR 1.0   Cardiac Enzymes: No results for input(s): "CKTOTAL", "CKMB", "CKMBINDEX", "TROPONINI" in the last 168 hours. BNP (last 3 results) No results for input(s): "PROBNP" in the last 8760 hours. HbA1C: No results for input(s): "HGBA1C" in the last 72 hours. CBG: No results for input(s): "GLUCAP" in the last 168 hours. Lipid Profile: No results for input(s): "CHOL", "HDL", "LDLCALC", "TRIG", "CHOLHDL", "LDLDIRECT" in the last 72 hours. Thyroid Function Tests: No results for input(s): "TSH", "T4TOTAL", "FREET4", "T3FREE", "THYROIDAB" in the last 72 hours. Anemia Panel: No results for input(s): "VITAMINB12", "FOLATE", "FERRITIN", "TIBC", "IRON", "RETICCTPCT" in the last 72 hours. Urine analysis:    Component Value Date/Time   COLORURINE STRAW (A) 11/18/2022  [place] 11/18/2022 2337   LABSPEC 1.011 11/18/2022 2337   PHURINE 6.0 11/18/2022 [place] 11/18/2022 2337   [place] 11/18/2022 [place] 11/18/2022 2337   BILIRUBINUR negative 12/24/2018 [place] 11/18/2022 2337   PROTEINUR NEGATIVE 11/18/2022 2337   UROBILINOGEN 0.2 12/24/2018 1722   UROBILINOGEN 0.2 01/09/2011 1359   NITRITE NEGATIVE 11/18/2022 2337   LEUKOCYTESUR NEGATIVE 11/18/2022 2337   Sepsis Labs: @LABRCNTIP$ (procalcitonin:4,lacticidven:4) ) Recent Results (from the past 240 hour(s))  Resp panel by RT-PCR (RSV, Flu A&B, Covid) Anterior Nasal Swab     Status: None   Collection Time: 11/18/22 11:37 PM   Specimen: Anterior Nasal Swab  Result Value Ref Range Status   SARS Coronavirus 2 by RT PCR NEGATIVE NEGATIVE Final    Comment: (NOTE) SARS-CoV-2 target nucleic acids are NOT DETECTED.  The SARS-CoV-2 RNA is  generally detectable in upper respiratory specimens during the acute phase of infection. The lowest concentration of SARS-CoV-2 viral copies this assay can detect is 138 copies/mL. A negative result does not preclude SARS-Cov-2 infection and should not be used as the sole basis for treatment or other patient management decisions. A negative result may occur with  improper specimen collection/handling, submission of specimen other than nasopharyngeal swab, presence of viral mutation(s) within the areas targeted by this assay, and inadequate number of viral copies(<138 copies/mL). A negative result must be combined with clinical observations, patient history, and epidemiological information. The expected result is Negative.  Fact Sheet for Patients:  EntrepreneurPulse.com.au  Fact Sheet for Healthcare Providers:  IncredibleEmployment.be  This test is no t yet approved or cleared by the [place] FDA and  has been authorized for detection and/or diagnosis of SARS-CoV-2 by FDA under an Emergency Use Authorization (EUA). This EUA will remain  in effect (meaning this test can be used) for the duration of the COVID-19 declaration under Section 564(b)(1) of the Act, 21 U.S.C.section 360bbb-3(b)(1), unless the authorization is terminated  or revoked sooner.       Influenza A by PCR NEGATIVE NEGATIVE Final   Influenza B by PCR NEGATIVE NEGATIVE Final    Comment: (NOTE) The Xpert Xpress SARS-CoV-2/FLU/RSV plus assay is intended as an aid in the diagnosis of influenza from Nasopharyngeal swab specimens and should not be used as a sole basis for treatment. Nasal washings and aspirates are unacceptable for Xpert Xpress SARS-CoV-2/FLU/RSV testing.  Fact Sheet for Patients: EntrepreneurPulse.com.au  Fact Sheet for Healthcare Providers: IncredibleEmployment.be  This test is not yet approved or cleared by the [place] FDA and has been authorized for detection and/or diagnosis of SARS-CoV-2 by FDA under an Emergency Use Authorization (EUA). This EUA will remain in effect (meaning this test can be used) for the duration of the COVID-19 declaration under Section 564(b)(1) of the Act, 21 U.S.C. section 360bbb-3(b)(1), unless the authorization is terminated or revoked.     Resp Syncytial Virus by PCR NEGATIVE NEGATIVE Final    Comment: (NOTE) Fact Sheet for Patients: EntrepreneurPulse.com.au  Fact Sheet for Healthcare Providers: IncredibleEmployment.be  This test is not yet approved or cleared by the [place] FDA and has been authorized for detection and/or diagnosis of SARS-CoV-2 by FDA under an Emergency Use Authorization (EUA). This EUA will remain in effect (meaning this test can be used) for the duration of the COVID-19 declaration under Section 564(b)(1) of the Act, 21 U.S.C. section 360bbb-3(b)(1), unless the authorization is terminated or revoked.  Performed at  [facility]  [address] [place], [place] [address]      Radiological Exams on Admission: CT ABDOMEN PELVIS W CONTRAST  Result Date: 11/18/2022 CLINICAL DATA:  Periumbilical pain for 2-3 months EXAM: CT ABDOMEN AND PELVIS WITH CONTRAST TECHNIQUE: Multidetector CT imaging of the abdomen and pelvis was performed using the standard protocol following bolus administration of intravenous contrast. RADIATION DOSE REDUCTION: This exam was performed according to the departmental dose-optimization program which includes automated exposure control, adjustment of the mA and/or kV according to patient size and/or use of iterative reconstruction technique. CONTRAST:  153m OMNIPAQUE IOHEXOL 300 MG/ML  SOLN COMPARISON:  04/07/2017 FINDINGS: Lower chest: No acute abnormality. Hepatobiliary: No solid liver abnormality is seen. No gallstones, gallbladder wall thickening, or biliary  dilatation. Pancreas: Unremarkable. No pancreatic ductal dilatation or surrounding inflammatory changes. Spleen: Normal in size without significant abnormality. Adrenals/Urinary Tract: Adrenal glands are unremarkable. Simple, benign right renal cortical cyst, for which no further follow-up or characterization is required. Kidneys are otherwise normal, without renal calculi, solid lesion, or hydronephrosis. Bladder is unremarkable. Stomach/Bowel: Stomach is within normal limits. Normal appendix. Severe descending and sigmoid diverticulosis. Long segment wall thickening of the mid to distal sigmoid colon (series 2, image 50). Probable contained perforation arising from the superior aspect of the distal sigmoid measuring 3.5 x 2.2 x 2.2 cm (series 3, image 47, series 5, image 47). Vascular/Lymphatic: Aortic atherosclerosis. No enlarged abdominal or pelvic lymph nodes. Reproductive: Status post hysterectomy. Other: No abdominal wall hernia or abnormality. No ascites. Musculoskeletal: No acute or significant osseous findings. IMPRESSION: 1. Severe descending and sigmoid diverticulosis. Long segment wall thickening of the mid to distal sigmoid colon, consistent with diverticulitis, possibly both acute and chronic inflammation. 2. Probable contained perforation arising from the superior aspect of the distal sigmoid measuring 3.5 x 2.2 x 2.2 cm. 3. Status post hysterectomy. These results were called by telephone at the time of interpretation on 11/18/2022 at 5:36 pm to Dr. JJuanita Craver, who verbally acknowledged these results. Aortic Atherosclerosis (ICD10-I70.0). Electronically Signed   By: ADelanna AhmadiM.D.   On: 11/18/2022 17:36    EKG: Independently reviewed.  Normal sinus rhythm.  Assessment/Plan Principal Problem:   Diverticulitis of colon with perforation Active Problems:   Mixed hyperlipidemia   Moderate episode of recurrent major depressive disorder (HCC)   Cigarette smoker   Ascending aortic aneurysm  (HCC)   Alcohol use disorder, moderate, dependence (HCC)   Hyponatremia    Sigmoid diverticulitis with possible contained perforation for which general surgery has been consulted will keep patient n.p.o. IV fluids antibiotics.  Pain relief medications. Alcohol use disorder for which patient is on CIWA protocol advised about quitting.  IV thiamine. History of anxiety and depression presently NPO. Hyponatremia could be from dehydration.  Will gently hydrate and follow metabolic panel.  Check urine studies. History of ascending aortic aneurysm mentioed in the chart but denies any chest pain.  Since patient has sigmoid diverticulitis with possible contained perforation will need close monitoring and more than 2 midnight stay inpatient status.   DVT prophylaxis: SCDs. Code Status: Full code. Family Communication: Discussed with patient. Disposition Plan: Admit to floor. Consults called: General surgery. Admission status: Inpatient.

## 2022-11-19 NOTE — ED Provider Notes (Signed)
[facility] Provider Note   CSN: [id number] Arrival date & time: 11/18/22  2147     History  Chief Complaint  Patient presents with   Abdominal Pain    Maureen Rodgers is a 72 y.o. female who presents to the emergency department at the direction of Outpatient gastroenterologist with concern for abnormal CT findings in the outpatient setting today.  Patient has been seen recently by her gastroenterologist with concern for severe lower abdominal pain for the last week with decreased appetite.  No fevers or chills at home.  Patient had outpatient CT of the abdomen pelvis with contrast today which revealed severe descending and sigmoid diverticulosis with sigmoid diverticulitis and probable contained perforation from the superior aspect of the distal sigmoid measuring 3 x 2 x 2 cm.  Patient endorses persistent lower abdominal pain, some loose stools today following CT but no nausea vomiting fevers or chills at this time.  Pain 3 out of 10 per patient at this time.  I personally reviewed her medical records.  She is history of ascending aortic aneurysm, hyperlipidemia, tobacco use, hypertension, anxiety.  She is not on anticoagulation.  EGD in February 2023 revealed hiatal hernia, duodenitis, angiodysplastic lesions in the duodenum and erosive gastropathy.  Colonoscopy same day review 3 mm descending colonic polyp, sessile removed with cold snare and angiodysplastic lesion in the ascending colon which was coagulated.  HPI     Home Medications Prior to Admission medications   Medication Sig Start Date End Date Taking? Authorizing Provider  alendronate (FOSAMAX) 70 MG tablet Take 1 tablet (70 mg total) by mouth every Sunday. 10/23/22  Yes Glendale Chard, MD  ALPRAZolam Duanne Moron) 1 MG tablet Take 1 mg by mouth 3 (three) times daily as needed for anxiety or sleep.   Yes [provider]  Calcium Carbonate-Vit D-Min (CALCIUM 1200 PO) Take 1  tablet by mouth daily.   Yes [provider]  Carboxymethylcellulose Sodium (EYE DROPS OP) Place 1 drop into both eyes 3 (three) times daily as needed (dry eyes).   Yes [provider]  DULoxetine (CYMBALTA) 60 MG capsule Take 60 mg by mouth at bedtime.   Yes [provider]  esomeprazole (NEXIUM) 40 MG capsule Take 40 mg by mouth daily at 12 noon.   Yes [provider]  fluticasone (FLONASE) 50 MCG/ACT nasal spray Place 1 spray into both nostrils daily. Patient taking differently: Place 1 spray into both nostrils daily as needed for allergies. 09/07/18  Yes Minette Brine, FNP  Iron-FA-B Cmp-C-Biot-Probiotic (FUSION PLUS) CAPS TAKE 1 CAPSULE BY MOUTH MONDAY THROUGH [facility] 08/22/22  Yes Glendale Chard, MD  Magnesium 400 MG TABS Take 1 tablet by mouth daily.   Yes [provider]  Omega-3 Fatty Acids (FISH OIL) 1000 MG CAPS Take 1,000 mg by mouth daily.    Yes [provider]  traZODone (DESYREL) 100 MG tablet Take 100 mg by mouth at bedtime.   Yes [provider]      Allergies    Patient has no known allergies.    Review of Systems   Review of Systems  Constitutional:  Positive for appetite change and fatigue.  HENT: Negative.    Respiratory: Negative.    Cardiovascular: Negative.   Gastrointestinal:  Positive for abdominal pain.  Genitourinary:  Positive for dysuria. Negative for decreased urine volume, urgency, vaginal bleeding, vaginal discharge and vaginal pain.  Musculoskeletal: Negative.   Skin: Negative.   Neurological: Negative.  Physical Exam Updated Vital Signs BP 129/89 (BP Location: Left Arm)   Pulse 73   Temp (!) 97.4 F (36.3 C) (Axillary)   Resp 16   SpO2 96%  Physical Exam Vitals and nursing note reviewed.  Constitutional:      Appearance: She is not ill-appearing or toxic-appearing.  HENT:     Head: Normocephalic and atraumatic.     Mouth/Throat:     Mouth: Mucous membranes are moist.      Pharynx: No oropharyngeal exudate or posterior oropharyngeal erythema.  Eyes:     General:        Right eye: No discharge.        Left eye: No discharge.     Conjunctiva/sclera: Conjunctivae normal.  Cardiovascular:     Rate and Rhythm: Normal rate and regular rhythm.     Pulses: Normal pulses.     Heart sounds: Normal heart sounds. No murmur heard. Pulmonary:     Effort: Pulmonary effort is normal. No respiratory distress.     Breath sounds: Normal breath sounds. No wheezing or rales.  Abdominal:     General: Bowel sounds are normal. There is no distension.     Palpations: Abdomen is soft.     Tenderness: There is abdominal tenderness in the right lower quadrant, periumbilical area, suprapubic area and left lower quadrant. There is no right CVA tenderness, left CVA tenderness, guarding or rebound.  Musculoskeletal:        General: No deformity.     Cervical back: Neck supple.  Skin:    General: Skin is warm and dry.     Capillary Refill: Capillary refill takes less than 2 seconds.  Neurological:     General: No focal deficit present.     Mental Status: She is alert and oriented to person, place, and time. Mental status is at baseline.  Psychiatric:        Mood and Affect: Mood normal.     ED Results / Procedures / Treatments   Labs (all labs ordered are listed, but only abnormal results are displayed) Labs Reviewed  COMPREHENSIVE METABOLIC PANEL - Abnormal; Notable for the following components:      Result Value   Sodium 125 (*)    Chloride 93 (*)    CO2 21 (*)    BUN 5 (*)    Calcium 8.7 (*)    AST 13 (*)    Total Bilirubin 0.2 (*)    All other components within normal limits  APTT - Abnormal; Notable for the following components:   aPTT 37 (*)    All other components within normal limits  URINALYSIS, ROUTINE W REFLEX MICROSCOPIC - Abnormal; Notable for the following components:   Color, Urine STRAW (*)    All other components within normal limits  RESP PANEL BY  RT-PCR (RSV, FLU A&B, COVID)  RVPGX2  CULTURE, BLOOD (ROUTINE X 2)  CULTURE, BLOOD (ROUTINE X 2)  LACTIC ACID, PLASMA  CBC WITH DIFFERENTIAL/PLATELET  PROTIME-INR  LACTIC ACID, PLASMA    EKG EKG Interpretation  Date/Time:  Friday November 18 2022 22:45:29 EST Ventricular Rate:  66 PR Interval:  144 QRS Duration: 82 QT Interval:  401 QTC Calculation: 421 R Axis:   45 Text Interpretation: Ectopic atrial rhythm No previous ECGs available Confirmed by Shanon Rosser [phone]) on 11/18/2022 10:49:23 PM  Radiology CT ABDOMEN PELVIS W CONTRAST  Result Date: 11/18/2022 CLINICAL DATA:  Periumbilical pain for 2-3 months EXAM: CT ABDOMEN AND PELVIS WITH CONTRAST TECHNIQUE:  Multidetector CT imaging of the abdomen and pelvis was performed using the standard protocol following bolus administration of intravenous contrast. RADIATION DOSE REDUCTION: This exam was performed according to the departmental dose-optimization program which includes automated exposure control, adjustment of the mA and/or kV according to patient size and/or use of iterative reconstruction technique. CONTRAST:  156m OMNIPAQUE IOHEXOL 300 MG/ML  SOLN COMPARISON:  04/07/2017 FINDINGS: Lower chest: No acute abnormality. Hepatobiliary: No solid liver abnormality is seen. No gallstones, gallbladder wall thickening, or biliary dilatation. Pancreas: Unremarkable. No pancreatic ductal dilatation or surrounding inflammatory changes. Spleen: Normal in size without significant abnormality. Adrenals/Urinary Tract: Adrenal glands are unremarkable. Simple, benign right renal cortical cyst, for which no further follow-up or characterization is required. Kidneys are otherwise normal, without renal calculi, solid lesion, or hydronephrosis. Bladder is unremarkable. Stomach/Bowel: Stomach is within normal limits. Normal appendix. Severe descending and sigmoid diverticulosis. Long segment wall thickening of the mid to distal sigmoid colon (series 2, image  50). Probable contained perforation arising from the superior aspect of the distal sigmoid measuring 3.5 x 2.2 x 2.2 cm (series 3, image 47, series 5, image 47). Vascular/Lymphatic: Aortic atherosclerosis. No enlarged abdominal or pelvic lymph nodes. Reproductive: Status post hysterectomy. Other: No abdominal wall hernia or abnormality. No ascites. Musculoskeletal: No acute or significant osseous findings. IMPRESSION: 1. Severe descending and sigmoid diverticulosis. Long segment wall thickening of the mid to distal sigmoid colon, consistent with diverticulitis, possibly both acute and chronic inflammation. 2. Probable contained perforation arising from the superior aspect of the distal sigmoid measuring 3.5 x 2.2 x 2.2 cm. 3. Status post hysterectomy. These results were called by telephone at the time of interpretation on 11/18/2022 at 5:36 pm to Dr. JJuanita Craver, who verbally acknowledged these results. Aortic Atherosclerosis (ICD10-I70.0). Electronically Signed   By: ADelanna AhmadiM.D.   On: 11/18/2022 17:36    Procedures Procedures    Medications Ordered in ED Medications  lactated ringers infusion ( Intravenous New Bag/Given 11/18/22 2339)  lactated ringers bolus 1,000 mL (0 mLs Intravenous Stopped 11/19/22 0049)  ceFEPIme (MAXIPIME) 2 g in sodium chloride 0.9 % 100 mL IVPB (0 g Intravenous Stopped 11/18/22 2339)  metroNIDAZOLE (FLAGYL) IVPB 500 mg (0 mg Intravenous Stopped 11/19/22 0050)    ED Course/ Medical Decision Making/ A&P Clinical Course as of 11/19/22 0129  Sat Nov 19, 2022  0128 Consult to Dr. CBrantley Stagefrom general surgery who will see the patient in the morning.  Consult Dr. KHal Hope hospitalist who is agreeable to meet this patient to his service.  Appreciate their collaboration in the care of this patient. [RS]    Clinical Course User Index [RS] Json Koelzer, RGypsy Balsam PA-C                             Medical Decision Making Amount and/or Complexity of Data Reviewed Labs:  ordered.    Details: CBC without leukocytosis or anemia, CMP with hyponatremia of 125, normal renal function.  INR is normal.  UA without evidence of infection, lactic acid is normal.   Radiology:     Details: CT of the abdomen pelvis in the outpatient setting today with severe descending and sigmoid diverticulosis with sigmoid diverticulitis and probable contained perforation from the superior aspect of the distal sigmoid measuring 3 x 2 x 2 cm. ECG/medicine tests: ordered.  Risk Prescription drug management. Decision regarding hospitalization.    Patient will require admission to the hospital for IV  antibiotic therapy and stabilization in context of acute complicated diverticulitis with ? perforation. Consult to general surgery and hospitalist as above.  Maureen Rodgers voiced understanding of her medical evaluation and treatment plan. Each of their questions answered to their expressed satisfaction.  She is amenable to plan for admission at this time.   This chart was dictated using voice recognition software, Dragon. Despite the best efforts of this provider to proofread and correct errors, errors may still occur which can change documentation meaning. Final Clinical Impression(s) / ED Diagnoses Final diagnoses:  Perforation of sigmoid colon due to diverticulitis    Rx / DC Orders ED Discharge Orders     None         Aura Dials 11/19/22 0129    Molpus, Jenny Reichmann, MD 11/19/22 [phone]

## 2022-11-19 NOTE — Progress Notes (Signed)
PROGRESS NOTE    Maureen Rodgers  [id number] DOB: 30-Nov-1950 DOA: 11/18/2022 PCP: Glendale Chard, MD   Brief Narrative: Maureen Rodgers is a 72 y.o. female with a history of depression, anxiety, alcohol abuse, hypertension, diverticulitis. Patient presented secondary to abdominal pain and was found to have evidence of acute diverticulitis with associated contained perforation. General surgery consulted. Conservative management.   Assessment and Plan:  Sigmoid diverticulitis with contained perforation Noted on CT abdomen/pelvis. Patient started empirically on Cefepime/Flagyl in the ED and transitioned to Zosyn on admission. Blood cultures obtained. General surgery consulted. -General surgery recommendations: NPO, bowel rest, conservative management  Hyponatremia Sodium of 125 on admission. Secondary to dehydration. Hyponatremia resolved with IV fluids.  Alcohol use disorder Noted. Patient started on CIWA protocol. -Continue CIWA  Anxiety Depression Patient is on Xanax, Cymbalta and trazodone as an outpatient. -Resume home Cymbalta and trazodone  Hypertension Noted in history. Patient is not on medication therapy. Would recommend treatment secondary to presence of thoracic AA. -Start amlodipine 5 mg daily  Ascending aortic aneurysm Noted.   GERD Patient is on Nexium as an outpatient.   DVT prophylaxis: SCDs Code Status:   Code Status: Full Code Family Communication: None at bedside Disposition Plan: Discharge home likely in 2-3 days   Consultants:  General surgery  Procedures:  None  Antimicrobials: Cefepime Flagyl Zosyn    Subjective: Patient reports improvement of abdominal pain since starting antibiotics  Objective: BP 125/84   Pulse (!) 49   Temp 97.8 F (36.6 C)   Resp 17   Ht 5' 2"$  (1.575 m)   Wt 52.6 kg   SpO2 97%   BMI 21.22 kg/m   Examination:  General exam: Appears calm and comfortable Respiratory system: Clear to  auscultation. Respiratory effort normal. Cardiovascular system: S1 & S2 heard, RRR. No murmurs, rubs, gallops or clicks. Gastrointestinal system: Abdomen is nondistended, soft. Normal bowel sounds heard. Central nervous system: Alert and oriented. No focal neurological deficits. Musculoskeletal: No edema. No calf tenderness Skin: No cyanosis. No rashes Psychiatry: Judgement and insight appear normal. Mood & affect appropriate.    Data Reviewed: I have personally reviewed following labs and imaging studies  CBC Lab Results  Component Value Date   WBC 5.2 11/19/2022   RBC 3.88 11/19/2022   HGB 12.6 11/19/2022   HCT 36.9 11/19/2022   MCV 95.1 11/19/2022   MCH 32.5 11/19/2022   PLT 400 11/19/2022   MCHC 34.1 11/19/2022   RDW 12.3 11/19/2022   LYMPHSABS 2.4 11/19/2022   MONOABS 0.4 11/19/2022   EOSABS 0.2 11/19/2022   BASOSABS 0.1 [id number]     Last metabolic panel Lab Results  Component Value Date   NA 135 11/19/2022   K 3.7 11/19/2022   CL 100 11/19/2022   CO2 25 11/19/2022   BUN <5 (L) 11/19/2022   CREATININE 0.65 11/19/2022   GLUCOSE 88 11/19/2022   GFRNONAA >60 11/19/2022   GFRAA 90 07/27/2020   CALCIUM 8.8 (L) 11/19/2022   PROT 6.0 (L) 11/19/2022   ALBUMIN 3.3 (L) 11/19/2022   LABGLOB 2.3 09/12/2022   AGRATIO 2.0 09/12/2022   BILITOT 0.4 11/19/2022   ALKPHOS 47 11/19/2022   AST 18 11/19/2022   ALT 12 11/19/2022   ANIONGAP 10 11/19/2022    GFR: Estimated Creatinine Clearance: 51 mL/min (by C-G formula based on SCr of 0.65 mg/dL).  Recent Results (from the past 240 hour(s))  Resp panel by RT-PCR (RSV, Flu A&B, Covid) Anterior Nasal Swab  Status: None   Collection Time: 11/18/22 11:37 PM   Specimen: Anterior Nasal Swab  Result Value Ref Range Status   SARS Coronavirus 2 by RT PCR NEGATIVE NEGATIVE Final    Comment: (NOTE) SARS-CoV-2 target nucleic acids are NOT DETECTED.  The SARS-CoV-2 RNA is generally detectable in upper respiratory specimens  during the acute phase of infection. The lowest concentration of SARS-CoV-2 viral copies this assay can detect is 138 copies/mL. A negative result does not preclude SARS-Cov-2 infection and should not be used as the sole basis for treatment or other patient management decisions. A negative result may occur with  improper specimen collection/handling, submission of specimen other than nasopharyngeal swab, presence of viral mutation(s) within the areas targeted by this assay, and inadequate number of viral copies(<138 copies/mL). A negative result must be combined with clinical observations, patient history, and epidemiological information. The expected result is Negative.  Fact Sheet for Patients:  EntrepreneurPulse.com.au  Fact Sheet for Healthcare Providers:  IncredibleEmployment.be  This test is no t yet approved or cleared by the [place] FDA and  has been authorized for detection and/or diagnosis of SARS-CoV-2 by FDA under an Emergency Use Authorization (EUA). This EUA will remain  in effect (meaning this test can be used) for the duration of the COVID-19 declaration under Section 564(b)(1) of the Act, 21 U.S.C.section 360bbb-3(b)(1), unless the authorization is terminated  or revoked sooner.       Influenza A by PCR NEGATIVE NEGATIVE Final   Influenza B by PCR NEGATIVE NEGATIVE Final    Comment: (NOTE) The Xpert Xpress SARS-CoV-2/FLU/RSV plus assay is intended as an aid in the diagnosis of influenza from Nasopharyngeal swab specimens and should not be used as a sole basis for treatment. Nasal washings and aspirates are unacceptable for Xpert Xpress SARS-CoV-2/FLU/RSV testing.  Fact Sheet for Patients: EntrepreneurPulse.com.au  Fact Sheet for Healthcare Providers: IncredibleEmployment.be  This test is not yet approved or cleared by the [place] FDA and has been authorized for detection  and/or diagnosis of SARS-CoV-2 by FDA under an Emergency Use Authorization (EUA). This EUA will remain in effect (meaning this test can be used) for the duration of the COVID-19 declaration under Section 564(b)(1) of the Act, 21 U.S.C. section 360bbb-3(b)(1), unless the authorization is terminated or revoked.     Resp Syncytial Virus by PCR NEGATIVE NEGATIVE Final    Comment: (NOTE) Fact Sheet for Patients: EntrepreneurPulse.com.au  Fact Sheet for Healthcare Providers: IncredibleEmployment.be  This test is not yet approved or cleared by the [place] FDA and has been authorized for detection and/or diagnosis of SARS-CoV-2 by FDA under an Emergency Use Authorization (EUA). This EUA will remain in effect (meaning this test can be used) for the duration of the COVID-19 declaration under Section 564(b)(1) of the Act, 21 U.S.C. section 360bbb-3(b)(1), unless the authorization is terminated or revoked.  Performed at [facility], [place] [address] [place], [place] [address]       Radiology Studies: CT ABDOMEN PELVIS W CONTRAST  Result Date: 11/18/2022 CLINICAL DATA:  Periumbilical pain for 2-3 months EXAM: CT ABDOMEN AND PELVIS WITH CONTRAST TECHNIQUE: Multidetector CT imaging of the abdomen and pelvis was performed using the standard protocol following bolus administration of intravenous contrast. RADIATION DOSE REDUCTION: This exam was performed according to the departmental dose-optimization program which includes automated exposure control, adjustment of the mA and/or kV according to patient size and/or use of iterative reconstruction technique. CONTRAST:  14m OMNIPAQUE IOHEXOL 300 MG/ML  SOLN COMPARISON:  04/07/2017 FINDINGS: Lower chest: No acute abnormality. Hepatobiliary: No solid liver abnormality is seen. No gallstones, gallbladder wall thickening, or biliary dilatation. Pancreas: Unremarkable. No pancreatic ductal  dilatation or surrounding inflammatory changes. Spleen: Normal in size without significant abnormality. Adrenals/Urinary Tract: Adrenal glands are unremarkable. Simple, benign right renal cortical cyst, for which no further follow-up or characterization is required. Kidneys are otherwise normal, without renal calculi, solid lesion, or hydronephrosis. Bladder is unremarkable. Stomach/Bowel: Stomach is within normal limits. Normal appendix. Severe descending and sigmoid diverticulosis. Long segment wall thickening of the mid to distal sigmoid colon (series 2, image 50). Probable contained perforation arising from the superior aspect of the distal sigmoid measuring 3.5 x 2.2 x 2.2 cm (series 3, image 47, series 5, image 47). Vascular/Lymphatic: Aortic atherosclerosis. No enlarged abdominal or pelvic lymph nodes. Reproductive: Status post hysterectomy. Other: No abdominal wall hernia or abnormality. No ascites. Musculoskeletal: No acute or significant osseous findings. IMPRESSION: 1. Severe descending and sigmoid diverticulosis. Long segment wall thickening of the mid to distal sigmoid colon, consistent with diverticulitis, possibly both acute and chronic inflammation. 2. Probable contained perforation arising from the superior aspect of the distal sigmoid measuring 3.5 x 2.2 x 2.2 cm. 3. Status post hysterectomy. These results were called by telephone at the time of interpretation on 11/18/2022 at 5:36 pm to Dr. Juanita Craver , who verbally acknowledged these results. Aortic Atherosclerosis (ICD10-I70.0). Electronically Signed   By: Delanna Ahmadi M.D.   On: 11/18/2022 17:36      LOS: 0 days    Cordelia Poche, MD [facility] 11/19/2022, 7:43 AM   If 7PM-7AM, please contact night-coverage [email or web address]

## 2022-11-19 NOTE — Consult Note (Signed)
Reason for Consult: Diverticulitis Referring Physician: [facility] Maureen Rodgers is an 72 y.o. female.  HPI: Patient presents emergency room with a 2-week history of lower abdominal pain.  She was seen by her gastroenterologist who ordered a CT scan as an outpatient.  She drank the contrast she said and went for the scan.  She went home and felt fine but she was told to go to the emergency room immediately after CT scan.  She has developed lower abdominal pain 3 out of 10.  Location left lower quadrant described as crampy and sharp with intermittent bouts of shooting pain which last for a few minutes.  No nausea or vomiting.  Last bowel movement was this morning.  Currently, her pain is at about a 4.  CT scan showed acute diverticulitis with questionable perforation.  Her vital signs have been stable.  Past Medical History:  Diagnosis Date   Ascending aortic aneurysm ([place]) 11/29/2021   Depression    Essential hypertension 11/29/2021   GERD (gastroesophageal reflux disease)    Tobacco use     Past Surgical History:  Procedure Laterality Date   ABDOMINAL HYSTERECTOMY     BILATERAL CARPAL TUNNEL RELEASE  2018   2018 on Right, many years ago on left   COLONOSCOPY WITH PROPOFOL N/A 11/26/2021   Procedure: COLONOSCOPY WITH PROPOFOL;  Surgeon: Carol Ada, MD;  Location: [facility];  Service: Endoscopy;  Laterality: N/A;   ESOPHAGOGASTRODUODENOSCOPY (EGD) WITH PROPOFOL N/A 11/26/2021   Procedure: ESOPHAGOGASTRODUODENOSCOPY (EGD) WITH PROPOFOL;  Surgeon: Carol Ada, MD;  Location: [facility];  Service: Endoscopy;  Laterality: N/A;   EYE SURGERY     HEMOSTASIS CLIP PLACEMENT  11/26/2021   Procedure: HEMOSTASIS CLIP PLACEMENT;  Surgeon: Carol Ada, MD;  Location: [facility];  Service: Endoscopy;;   HOT HEMOSTASIS N/A 11/26/2021   Procedure: HOT HEMOSTASIS (ARGON PLASMA COAGULATION/BICAP);  Surgeon: Carol Ada, MD;  Location: [facility];  Service: Endoscopy;  Laterality: N/A;  EGD and  COLON   INCISION AND DRAINAGE PERIRECTAL ABSCESS N/A 02/27/2020   Procedure: IRRIGATION AND DEBRIDEMENT PERIRECTAL ABSCESS;  Surgeon: Donnie Mesa, MD;  Location: [facility];  Service: General;  Laterality: N/A;   IRRIGATION AND DEBRIDEMENT ABSCESS  02/27/2020   Procedure(s) Performed: IRRIGATION AND DEBRIDEMENT PERIRECTAL ABSCESS (N/A Perineum)   POLYPECTOMY  11/26/2021   Procedure: POLYPECTOMY;  Surgeon: Carol Ada, MD;  [facility];  Service: Endoscopy;;    Family History  Problem Relation Age of Onset   Heart disease Mother    Dementia Mother    Alcohol abuse Mother    Alcohol abuse Father    Heart disease Brother    Diabetes Brother    Heart attack Maternal Grandmother     Social History:  reports that she has been smoking cigarettes. She started smoking about 48 years ago. She has a 33.00 pack-year smoking history. She has never used smokeless tobacco. She reports current alcohol use of about 2.0 standard drinks of alcohol per week. She reports that she does not use drugs.  Allergies: No Known Allergies  Medications: I have reviewed the patient's current medications.  Results for orders placed or performed during the hospital encounter of 11/18/22 (from the past 48 hour(s))  Lactic acid, plasma     Status: None   Collection Time: 11/18/22 11:00 PM  Result Value Ref Range   Lactic Acid, Venous 1.1 0.5 - 1.9 mmol/L    Comment: Performed at [facility], [place] [address], [place], [place] [address]  Comprehensive metabolic panel     Status: Abnormal   Collection Time: 11/18/22 11:00 PM  Result Value Ref Range   Sodium 125 (L) 135 - 145 mmol/L   Potassium 3.6 3.5 - 5.1 mmol/L   Chloride 93 (L) 98 - 111 mmol/L   CO2 21 (L) 22 - 32 mmol/L   Glucose, Bld 86 70 - 99 mg/dL    Comment: Glucose reference range applies only to samples taken after fasting for at least 8 hours.   BUN 5 (L) 8 - 23 mg/dL   Creatinine, Ser 0.53 0.44 - 1.00 mg/dL   Calcium 8.7  (L) 8.9 - 10.3 mg/dL   Total Protein 6.8 6.5 - 8.1 g/dL   Albumin 3.5 3.5 - 5.0 g/dL   AST 13 (L) 15 - 41 U/L   ALT 13 0 - 44 U/L   Alkaline Phosphatase 54 38 - 126 U/L   Total Bilirubin 0.2 (L) 0.3 - 1.2 mg/dL   GFR, Estimated >60 >60 mL/min    Comment: (NOTE) Calculated using the CKD-EPI Creatinine Equation (2021)    Anion gap 11 5 - 15    Comment: Performed at [facility], [place] [address], [place], [place] [address]  CBC with Differential     Status: None   Collection Time: 11/18/22 11:00 PM  Result Value Ref Range   WBC 7.2 4.0 - 10.5 K/uL   RBC 4.03 3.87 - 5.11 MIL/uL   Hemoglobin 13.1 12.0 - 15.0 g/dL   HCT 38.0 36.0 - 46.0 %   MCV 94.3 80.0 - 100.0 fL   MCH 32.5 26.0 - 34.0 pg   MCHC 34.5 30.0 - 36.0 g/dL   RDW 12.3 11.5 - 15.5 %   Platelets 380 150 - 400 K/uL   nRBC 0.0 0.0 - 0.2 %   Neutrophils Relative % 55 %   Neutro Abs 4.0 1.7 - 7.7 K/uL   Lymphocytes Relative 34 %   Lymphs Abs 2.5 0.7 - 4.0 K/uL   Monocytes Relative 8 %   Monocytes Absolute 0.6 0.1 - 1.0 K/uL   Eosinophils Relative 2 %   Eosinophils Absolute 0.2 0.0 - 0.5 K/uL   Basophils Relative 1 %   Basophils Absolute 0.0 0.0 - 0.1 K/uL   Immature Granulocytes 0 %   Abs Immature Granulocytes 0.03 0.00 - 0.07 K/uL    Comment: Performed at [facility], [place] [address], [place], [place] [address]  Protime-INR     Status: None   Collection Time: 11/18/22 11:00 PM  Result Value Ref Range   Prothrombin Time 13.2 11.4 - 15.2 seconds   INR 1.0 0.8 - 1.2    Comment: (NOTE) INR goal varies based on device and disease states. Performed at [facility], [place] [address], [place], [place] [address]   APTT     Status: Abnormal   Collection Time: 11/18/22 11:00 PM  Result Value Ref Range   aPTT 37 (H) 24 - 36 seconds    Comment:        IF BASELINE aPTT IS ELEVATED, SUGGEST PATIENT RISK ASSESSMENT BE USED TO DETERMINE APPROPRIATE ANTICOAGULANT  THERAPY. Performed at [facility], [place] [address] [place], [place] [address]   Resp panel by RT-PCR (RSV, Flu A&B, Covid) Anterior Nasal Swab     Status: None   Collection Time: 11/18/22 11:37 PM   Specimen: Anterior Nasal Swab  Result Value Ref Range   SARS Coronavirus 2 by RT PCR  NEGATIVE NEGATIVE    Comment: (NOTE) SARS-CoV-2 target nucleic acids are NOT DETECTED.  The SARS-CoV-2 RNA is generally detectable in upper respiratory specimens during the acute phase of infection. The lowest concentration of SARS-CoV-2 viral copies this assay can detect is 138 copies/mL. A negative result does not preclude SARS-Cov-2 infection and should not be used as the sole basis for treatment or other patient management decisions. A negative result may occur with  improper specimen collection/handling, submission of specimen other than nasopharyngeal swab, presence of viral mutation(s) within the areas targeted by this assay, and inadequate number of viral copies(<138 copies/mL). A negative result must be combined with clinical observations, patient history, and epidemiological information. The expected result is Negative.  Fact Sheet for Patients:  EntrepreneurPulse.com.au  Fact Sheet for Healthcare Providers:  IncredibleEmployment.be  This test is no t yet approved or cleared by the [place] FDA and  has been authorized for detection and/or diagnosis of SARS-CoV-2 by FDA under an Emergency Use Authorization (EUA). This EUA will remain  in effect (meaning this test can be used) for the duration of the COVID-19 declaration under Section 564(b)(1) of the Act, 21 U.S.C.section 360bbb-3(b)(1), unless the authorization is terminated  or revoked sooner.       Influenza A by PCR NEGATIVE NEGATIVE   Influenza B by PCR NEGATIVE NEGATIVE    Comment: (NOTE) The Xpert Xpress SARS-CoV-2/FLU/RSV plus assay is intended as an aid in the  diagnosis of influenza from Nasopharyngeal swab specimens and should not be used as a sole basis for treatment. Nasal washings and aspirates are unacceptable for Xpert Xpress SARS-CoV-2/FLU/RSV testing.  Fact Sheet for Patients: EntrepreneurPulse.com.au  Fact Sheet for Healthcare Providers: IncredibleEmployment.be  This test is not yet approved or cleared by the [place] FDA and has been authorized for detection and/or diagnosis of SARS-CoV-2 by FDA under an Emergency Use Authorization (EUA). This EUA will remain in effect (meaning this test can be used) for the duration of the COVID-19 declaration under Section 564(b)(1) of the Act, 21 U.S.C. section 360bbb-3(b)(1), unless the authorization is terminated or revoked.     Resp Syncytial Virus by PCR NEGATIVE NEGATIVE    Comment: (NOTE) Fact Sheet for Patients: EntrepreneurPulse.com.au  Fact Sheet for Healthcare Providers: IncredibleEmployment.be  This test is not yet approved or cleared by the [place] FDA and has been authorized for detection and/or diagnosis of SARS-CoV-2 by FDA under an Emergency Use Authorization (EUA). This EUA will remain in effect (meaning this test can be used) for the duration of the COVID-19 declaration under Section 564(b)(1) of the Act, 21 U.S.C. section 360bbb-3(b)(1), unless the authorization is terminated or revoked.  Performed at [facility], [place] [address] [place], [place] [address]   Urinalysis, Routine w reflex microscopic -Urine, Clean Catch     Status: Abnormal   Collection Time: 11/18/22 11:37 PM  Result Value Ref Range   Color, Urine STRAW (A) YELLOW   APPearance CLEAR CLEAR   Specific Gravity, Urine 1.011 1.005 - 1.030   pH 6.0 5.0 - 8.0   Glucose, UA NEGATIVE NEGATIVE mg/dL   Hgb urine dipstick NEGATIVE NEGATIVE   Bilirubin Urine NEGATIVE NEGATIVE   Ketones, ur NEGATIVE NEGATIVE  mg/dL   Protein, ur NEGATIVE NEGATIVE mg/dL   Nitrite NEGATIVE NEGATIVE   Leukocytes,Ua NEGATIVE NEGATIVE    Comment: Performed at [place] [address] [place], [place] [id number]  Basic metabolic panel     Status: Abnormal   Collection  Time: 11/19/22  6:07 AM  Result Value Ref Range   Sodium 135 135 - 145 mmol/L    Comment: DELTA CHECK NOTED   Potassium 3.7 3.5 - 5.1 mmol/L   Chloride 100 98 - 111 mmol/L   CO2 25 22 - 32 mmol/L   Glucose, Bld 88 70 - 99 mg/dL    Comment: Glucose reference range applies only to samples taken after fasting for at least 8 hours.   BUN <5 (L) 8 - 23 mg/dL   Creatinine, Ser 0.65 0.44 - 1.00 mg/dL   Calcium 8.8 (L) 8.9 - 10.3 mg/dL   GFR, Estimated >60 >60 mL/min    Comment: (NOTE) Calculated using the CKD-EPI Creatinine Equation (2021)    Anion gap 10 5 - 15    Comment: Performed at [facility], [place] [address], [place], [place] [address]  Hepatic function panel     Status: Abnormal   Collection Time: 11/19/22  6:07 AM  Result Value Ref Range   Total Protein 6.0 (L) 6.5 - 8.1 g/dL   Albumin 3.3 (L) 3.5 - 5.0 g/dL   AST 18 15 - 41 U/L   ALT 12 0 - 44 U/L   Alkaline Phosphatase 47 38 - 126 U/L   Total Bilirubin 0.4 0.3 - 1.2 mg/dL   Bilirubin, Direct 0.1 0.0 - 0.2 mg/dL   Indirect Bilirubin 0.3 0.3 - 0.9 mg/dL    Comment: Performed at [facility], [place] [address] [place], [place] [address]  CBC with Differential/Platelet     Status: None   Collection Time: 11/19/22  6:07 AM  Result Value Ref Range   WBC 5.2 4.0 - 10.5 K/uL   RBC 3.88 3.87 - 5.11 MIL/uL   Hemoglobin 12.6 12.0 - 15.0 g/dL   HCT 36.9 36.0 - 46.0 %   MCV 95.1 80.0 - 100.0 fL   MCH 32.5 26.0 - 34.0 pg   MCHC 34.1 30.0 - 36.0 g/dL   RDW 12.3 11.5 - 15.5 %   Platelets 400 150 - 400 K/uL   nRBC 0.0 0.0 - 0.2 %   Neutrophils Relative % 43 %   Neutro Abs 2.3 1.7 - 7.7 K/uL   Lymphocytes Relative 45 %   Lymphs Abs  2.4 0.7 - 4.0 K/uL   Monocytes Relative 7 %   Monocytes Absolute 0.4 0.1 - 1.0 K/uL   Eosinophils Relative 3 %   Eosinophils Absolute 0.2 0.0 - 0.5 K/uL   Basophils Relative 1 %   Basophils Absolute 0.1 0.0 - 0.1 K/uL   Immature Granulocytes 1 %   Abs Immature Granulocytes 0.03 0.00 - 0.07 K/uL    Comment: Performed at [facility], [place] [address] [place], [place] [address]  CBG monitoring, ED     Status: Abnormal   Collection Time: 11/19/22  6:40 AM  Result Value Ref Range   Glucose-Capillary 103 (H) 70 - 99 mg/dL    Comment: Glucose reference range applies only to samples taken after fasting for at least 8 hours.    CT ABDOMEN PELVIS W CONTRAST  Result Date: 11/18/2022 CLINICAL DATA:  Periumbilical pain for 2-3 months EXAM: CT ABDOMEN AND PELVIS WITH CONTRAST TECHNIQUE: Multidetector CT imaging of the abdomen and pelvis was performed using the standard protocol following bolus administration of intravenous contrast. RADIATION DOSE REDUCTION: This exam was performed according to the departmental dose-optimization program which includes automated exposure control, adjustment of the mA and/or kV according to patient size  and/or use of iterative reconstruction technique. CONTRAST:  134m OMNIPAQUE IOHEXOL 300 MG/ML  SOLN COMPARISON:  04/07/2017 FINDINGS: Lower chest: No acute abnormality. Hepatobiliary: No solid liver abnormality is seen. No gallstones, gallbladder wall thickening, or biliary dilatation. Pancreas: Unremarkable. No pancreatic ductal dilatation or surrounding inflammatory changes. Spleen: Normal in size without significant abnormality. Adrenals/Urinary Tract: Adrenal glands are unremarkable. Simple, benign right renal cortical cyst, for which no further follow-up or characterization is required. Kidneys are otherwise normal, without renal calculi, solid lesion, or hydronephrosis. Bladder is unremarkable. Stomach/Bowel: Stomach is within normal limits. Normal  appendix. Severe descending and sigmoid diverticulosis. Long segment wall thickening of the mid to distal sigmoid colon (series 2, image 50). Probable contained perforation arising from the superior aspect of the distal sigmoid measuring 3.5 x 2.2 x 2.2 cm (series 3, image 47, series 5, image 47). Vascular/Lymphatic: Aortic atherosclerosis. No enlarged abdominal or pelvic lymph nodes. Reproductive: Status post hysterectomy. Other: No abdominal wall hernia or abnormality. No ascites. Musculoskeletal: No acute or significant osseous findings. IMPRESSION: 1. Severe descending and sigmoid diverticulosis. Long segment wall thickening of the mid to distal sigmoid colon, consistent with diverticulitis, possibly both acute and chronic inflammation. 2. Probable contained perforation arising from the superior aspect of the distal sigmoid measuring 3.5 x 2.2 x 2.2 cm. 3. Status post hysterectomy. These results were called by telephone at the time of interpretation on 11/18/2022 at 5:36 pm to Dr. JJuanita Craver, who verbally acknowledged these results. Aortic Atherosclerosis (ICD10-I70.0). Electronically Signed   By: ADelanna AhmadiM.D.   On: 11/18/2022 17:36    Review of Systems  Gastrointestinal:  Positive for abdominal pain. Negative for nausea and vomiting.  Musculoskeletal:  Positive for arthralgias and back pain.  All other systems reviewed and are negative.  Blood pressure 137/81, pulse (!) 55, temperature 97.8 F (36.6 C), resp. rate 17, height 5' 2"$  (1.575 m), weight 52.6 kg, SpO2 99 %. Physical Exam HENT:     Head: Normocephalic.  Eyes:     Extraocular Movements: Extraocular movements intact.  Cardiovascular:     Rate and Rhythm: Normal rate.  Pulmonary:     Effort: Pulmonary effort is normal.     Breath sounds: No stridor.  Abdominal:     General: Abdomen is flat.     Palpations: Abdomen is soft.     Tenderness: There is abdominal tenderness in the left lower quadrant. There is guarding. There is no  rebound.  Skin:    General: Skin is warm and dry.  Neurological:     General: No focal deficit present.     Mental Status: She is alert and oriented to person, place, and time.  Psychiatric:        Mood and Affect: Mood normal.        Behavior: Behavior normal.     Assessment/Plan: Acute diverticulitis with questionable perforation  EtOH use  CT scan reviewed and patient examined.  Chart reviewed.  Signs of acute diverticulitis.  She has had this for about 2 weeks with a waxing and waning course.  She may have had about many years ago but is unsure.  No abscess on CT scan nor acute surgical need at this point in time.  Recommend IV antibiotics, bowel rest and IV fluids.  Recommend CIWA protocol given alcohol use  Okay to have medications with sips of water  I personally saw the patient and performed a the above.   I personally reviewed images of CT  showing  DIVERTICULITIS . I personally discussed this patient's care with [place]  about pt care . I reviewed recent lab values, vitals, and notes.  This care required moderate level of medical decision making.   Joyice Faster Mykiah Schmuck MD  11/19/2022, 8:52 AM

## 2022-11-19 NOTE — Plan of Care (Signed)
  Problem: Clinical Measurements: Goal: Ability to maintain clinical measurements within normal limits will improve Outcome: Progressing Goal: Will remain free from infection Outcome: Progressing   Problem: Activity: Goal: Risk for activity intolerance will decrease Outcome: Progressing   Problem: Coping: Goal: Level of anxiety will decrease Outcome: Progressing   Problem: Safety: Goal: Ability to remain free from injury will improve Outcome: Progressing

## 2022-11-19 NOTE — Progress Notes (Signed)
Pharmacy Antibiotic Note  RENEZMEE Rodgers is a 72 y.o. female admitted on 11/18/2022 with diverticulitis of colon with perforation.  In the ED patient received Cefepime 2gm IV and Metronidazole 537m IV x 1 dose each.  Pharmacy has been consulted for Zosyn dosing.  Plan: Zosyn 3.375g IV q8h (4 hour infusion). Need for further dosage adjustment appears unlikely at present.    Will sign off at this time.  Please reconsult if a change in clinical status warrants re-evaluation of dosage.    Temp (24hrs), Avg:97.8 F (36.6 C), Min:97.4 F (36.3 C), Max:98 F (36.7 C)  Recent Labs  Lab 11/18/22 2300  WBC 7.2  CREATININE 0.53  LATICACIDVEN 1.1    CrCl cannot be calculated (Unknown ideal weight.).    No Known Allergies   Thank you for allowing pharmacy to be a part of this patient's care.  PEverette Rank PharmD 11/19/2022 5:26 AM

## 2022-11-20 DIAGNOSIS — F1020 Alcohol dependence, uncomplicated: Secondary | ICD-10-CM | POA: Diagnosis not present

## 2022-11-20 DIAGNOSIS — E871 Hypo-osmolality and hyponatremia: Secondary | ICD-10-CM | POA: Diagnosis not present

## 2022-11-20 DIAGNOSIS — I7121 Aneurysm of the ascending aorta, without rupture: Secondary | ICD-10-CM

## 2022-11-20 DIAGNOSIS — K5720 Diverticulitis of large intestine with perforation and abscess without bleeding: Secondary | ICD-10-CM | POA: Diagnosis not present

## 2022-11-20 LAB — GLUCOSE, CAPILLARY
Glucose-Capillary: 122 mg/dL — ABNORMAL HIGH (ref 70–99)
Glucose-Capillary: 123 mg/dL — ABNORMAL HIGH (ref 70–99)
Glucose-Capillary: 126 mg/dL — ABNORMAL HIGH (ref 70–99)
Glucose-Capillary: 85 mg/dL (ref 70–99)
Glucose-Capillary: 86 mg/dL (ref 70–99)

## 2022-11-20 LAB — SODIUM: Sodium: 136 mmol/L (ref 135–145)

## 2022-11-20 MED ORDER — LACTATED RINGERS IV SOLN: INTRAVENOUS | Status: DC

## 2022-11-20 NOTE — Progress Notes (Signed)
PROGRESS NOTE    Maureen Rodgers  [id number] DOB: 1951/05/14 DOA: 11/18/2022 PCP: Glendale Chard, MD   Brief Narrative: Maureen Rodgers is a 72 y.o. female with a history of depression, anxiety, alcohol abuse, hypertension, diverticulitis. Patient presented secondary to abdominal pain and was found to have evidence of acute diverticulitis with associated contained perforation. General surgery consulted. Conservative management.   Assessment and Plan:  Sigmoid diverticulitis with contained perforation Noted on CT abdomen/pelvis. Patient started empirically on Cefepime/Flagyl in the ED and transitioned to Zosyn on admission. Blood cultures obtained. General surgery consulted. -General surgery recommendations: CLD, IV fluids, conservative management  Hyponatremia Sodium of 125 on admission. Secondary to dehydration. Hyponatremia resolved with IV fluids.  Alcohol use disorder Noted. Patient started on CIWA protocol. -Continue CIWA  Anxiety Depression Patient is on Xanax, Cymbalta and trazodone as an outpatient. -Continue home Cymbalta and trazodone  Hypertension Noted in history. Patient is not on medication therapy. Would recommend treatment secondary to presence of thoracic AA. -Continue amlodipine 5 mg daily  Ascending aortic aneurysm Noted.   GERD Patient is on Nexium as an outpatient.   DVT prophylaxis: SCDs Code Status:   Code Status: Full Code Family Communication: None at bedside Disposition Plan: Discharge home likely in 2 days   Consultants:  General surgery  Procedures:  None  Antimicrobials: Cefepime Flagyl Zosyn    Subjective: Patient without nausea/vomiting. Still with some LLQ abdominal pain. Patient had a bowel movement this morning.   Objective: BP (!) 149/81 (BP Location: Left Arm)   Pulse (!) 52   Temp 98.3 F (36.8 C) (Oral)   Resp 16   Ht 5' 2"$  (1.575 m)   Wt 52.6 kg   SpO2 99%   BMI 21.22 kg/m    Examination:  General exam: Appears calm and comfortable  Respiratory system: Clear to auscultation. Respiratory effort normal. Cardiovascular system: S1 & S2 heard, RRR. Gastrointestinal system: Abdomen is nondistended, soft and tender in LLQ. Normal bowel sounds heard. Central nervous system: Alert and oriented. No focal neurological deficits. Musculoskeletal: No edema. No calf tenderness Skin: No cyanosis. No rashes Psychiatry: Judgement and insight appear normal. Mood & affect appropriate.    Data Reviewed: I have personally reviewed following labs and imaging studies  CBC Lab Results  Component Value Date   WBC 5.2 11/19/2022   RBC 3.88 11/19/2022   HGB 12.6 11/19/2022   HCT 36.9 11/19/2022   MCV 95.1 11/19/2022   MCH 32.5 11/19/2022   PLT 400 11/19/2022   MCHC 34.1 11/19/2022   RDW 12.3 11/19/2022   LYMPHSABS 2.4 11/19/2022   MONOABS 0.4 11/19/2022   EOSABS 0.2 11/19/2022   BASOSABS 0.1 [id number]     Last metabolic panel Lab Results  Component Value Date   NA 136 11/20/2022   K 3.7 11/19/2022   CL 94 (L) 11/19/2022   CO2 22 11/19/2022   BUN 5 (L) 11/19/2022   CREATININE 0.77 11/19/2022   GLUCOSE 90 11/19/2022   GFRNONAA >60 11/19/2022   GFRAA 90 07/27/2020   CALCIUM 8.4 (L) 11/19/2022   PROT 6.0 (L) 11/19/2022   ALBUMIN 3.3 (L) 11/19/2022   LABGLOB 2.3 09/12/2022   AGRATIO 2.0 09/12/2022   BILITOT 0.4 11/19/2022   ALKPHOS 47 11/19/2022   AST 18 11/19/2022   ALT 12 11/19/2022   ANIONGAP 11 11/19/2022    GFR: Estimated Creatinine Clearance: 51 mL/min (by C-G formula based on SCr of 0.77 mg/dL).  Recent Results (from the past 240 hour(s))  Blood Culture (routine x 2)     Status: None (Preliminary result)   Collection Time: 11/18/22 11:00 PM   Specimen: BLOOD RIGHT WRIST  Result Value Ref Range Status   Specimen Description   Final    BLOOD RIGHT WRIST Performed at [facility], [address], [place], [place] [address]    Special  Requests   Final    BOTTLES DRAWN AEROBIC AND ANAEROBIC Blood Culture adequate volume Performed at [facility] [address] [place], [place] [address]    Culture   Final    NO GROWTH 1 DAY Performed at [facility], [place] [address] [place], [place] [address]    Report Status PENDING  Incomplete  Blood Culture (routine x 2)     Status: None (Preliminary result)   Collection Time: 11/18/22 11:06 PM   Specimen: BLOOD RIGHT FOREARM  Result Value Ref Range Status   Specimen Description   Final    BLOOD RIGHT FOREARM Performed at [facility], [place] [address], [place], [place] [address]    Special Requests   Final    BOTTLES DRAWN AEROBIC AND ANAEROBIC Blood Culture adequate volume Performed at [facility] [address], [place], [place] [address]    Culture   Final    NO GROWTH 1 DAY Performed at [facility], [place] [address], [place], [place] [address]    Report Status PENDING  Incomplete  Resp panel by RT-PCR (RSV, Flu A&B, Covid) Anterior Nasal Swab     Status: None   Collection Time: 11/18/22 11:37 PM   Specimen: Anterior Nasal Swab  Result Value Ref Range Status   SARS Coronavirus 2 by RT PCR NEGATIVE NEGATIVE Final    Comment: (NOTE) SARS-CoV-2 target nucleic acids are NOT DETECTED.  The SARS-CoV-2 RNA is generally detectable in upper respiratory specimens during the acute phase of infection. The lowest concentration of SARS-CoV-2 viral copies this assay can detect is 138 copies/mL. A negative result does not preclude SARS-Cov-2 infection and should not be used as the sole basis for treatment or other patient management decisions. A negative result may occur with  improper specimen collection/handling, submission of specimen other than nasopharyngeal swab, presence of viral mutation(s) within the areas targeted by this assay, and inadequate number of viral copies(<138 copies/mL). A negative result must be  combined with clinical observations, patient history, and epidemiological information. The expected result is Negative.  Fact Sheet for Patients:  EntrepreneurPulse.com.au  Fact Sheet for Healthcare Providers:  IncredibleEmployment.be  This test is no t yet approved or cleared by the [place] FDA and  has been authorized for detection and/or diagnosis of SARS-CoV-2 by FDA under an Emergency Use Authorization (EUA). This EUA will remain  in effect (meaning this test can be used) for the duration of the COVID-19 declaration under Section 564(b)(1) of the Act, 21 U.S.C.section 360bbb-3(b)(1), unless the authorization is terminated  or revoked sooner.       Influenza A by PCR NEGATIVE NEGATIVE Final   Influenza B by PCR NEGATIVE NEGATIVE Final    Comment: (NOTE) The Xpert Xpress SARS-CoV-2/FLU/RSV plus assay is intended as an aid in the diagnosis of influenza from Nasopharyngeal swab specimens and should not be used as a sole basis for treatment. Nasal washings and aspirates are unacceptable for Xpert Xpress SARS-CoV-2/FLU/RSV testing.  Fact Sheet for Patients: EntrepreneurPulse.com.au  Fact Sheet for Healthcare Providers: IncredibleEmployment.be  This test is not yet approved  or cleared by the [place] and has been authorized for detection and/or diagnosis of SARS-CoV-2 by FDA under an Emergency Use Authorization (EUA). This EUA will remain in effect (meaning this test can be used) for the duration of the COVID-19 declaration under Section 564(b)(1) of the Act, 21 U.S.C. section 360bbb-3(b)(1), unless the authorization is terminated or revoked.     Resp Syncytial Virus by PCR NEGATIVE NEGATIVE Final    Comment: (NOTE) Fact Sheet for Patients: EntrepreneurPulse.com.au  Fact Sheet for Healthcare Providers: IncredibleEmployment.be  This test is not yet  approved or cleared by the [place] FDA and has been authorized for detection and/or diagnosis of SARS-CoV-2 by FDA under an Emergency Use Authorization (EUA). This EUA will remain in effect (meaning this test can be used) for the duration of the COVID-19 declaration under Section 564(b)(1) of the Act, 21 U.S.C. section 360bbb-3(b)(1), unless the authorization is terminated or revoked.  Performed at [facility], [place] [address], [place], [place] [address]       Radiology Studies: CT ABDOMEN PELVIS W CONTRAST  Result Date: 11/18/2022 CLINICAL DATA:  Periumbilical pain for 2-3 months EXAM: CT ABDOMEN AND PELVIS WITH CONTRAST TECHNIQUE: Multidetector CT imaging of the abdomen and pelvis was performed using the standard protocol following bolus administration of intravenous contrast. RADIATION DOSE REDUCTION: This exam was performed according to the departmental dose-optimization program which includes automated exposure control, adjustment of the mA and/or kV according to patient size and/or use of iterative reconstruction technique. CONTRAST:  140m OMNIPAQUE IOHEXOL 300 MG/ML  SOLN COMPARISON:  04/07/2017 FINDINGS: Lower chest: No acute abnormality. Hepatobiliary: No solid liver abnormality is seen. No gallstones, gallbladder wall thickening, or biliary dilatation. Pancreas: Unremarkable. No pancreatic ductal dilatation or surrounding inflammatory changes. Spleen: Normal in size without significant abnormality. Adrenals/Urinary Tract: Adrenal glands are unremarkable. Simple, benign right renal cortical cyst, for which no further follow-up or characterization is required. Kidneys are otherwise normal, without renal calculi, solid lesion, or hydronephrosis. Bladder is unremarkable. Stomach/Bowel: Stomach is within normal limits. Normal appendix. Severe descending and sigmoid diverticulosis. Long segment wall thickening of the mid to distal sigmoid colon (series 2, image 50).  Probable contained perforation arising from the superior aspect of the distal sigmoid measuring 3.5 x 2.2 x 2.2 cm (series 3, image 47, series 5, image 47). Vascular/Lymphatic: Aortic atherosclerosis. No enlarged abdominal or pelvic lymph nodes. Reproductive: Status post hysterectomy. Other: No abdominal wall hernia or abnormality. No ascites. Musculoskeletal: No acute or significant osseous findings. IMPRESSION: 1. Severe descending and sigmoid diverticulosis. Long segment wall thickening of the mid to distal sigmoid colon, consistent with diverticulitis, possibly both acute and chronic inflammation. 2. Probable contained perforation arising from the superior aspect of the distal sigmoid measuring 3.5 x 2.2 x 2.2 cm. 3. Status post hysterectomy. These results were called by telephone at the time of interpretation on 11/18/2022 at 5:36 pm to Dr. JJuanita Craver, who verbally acknowledged these results. Aortic Atherosclerosis (ICD10-I70.0). Electronically Signed   By: ADelanna AhmadiM.D.   On: 11/18/2022 17:36      LOS: 1 day    RCordelia Poche MD [facility] 11/20/2022, 12:22 PM   If 7PM-7AM, please contact night-coverage [email or web address]

## 2022-11-20 NOTE — Progress Notes (Signed)
Subjective/Chief Complaint: Patient states pain is better.  No nausea or vomiting.   Objective: Vital signs in last 24 hours: Temp:  [97.9 F (36.6 C)-98.8 F (37.1 C)] 98.3 F (36.8 C) (02/18 0402) Pulse Rate:  [46-95] 55 (02/18 0402) Resp:  [15-19] 16 (02/18 0402) BP: (121-166)/(72-111) 146/106 (02/18 0402) SpO2:  [98 %-100 %] 99 % (02/18 0402) Last BM Date : 11/19/22  Intake/Output from previous day: 02/17 0701 - 02/18 0700 In: 2389.8 [I.V.:2246.7; IV Piggyback:143.1] Out: -  Intake/Output this shift: No intake/output data recorded.  General appearance: alert and cooperative Resp: clear to auscultation bilaterally Cardio: Normal sinus rhythm GI: Soft nontender with minimal left lower quadrant pain to palpation no rebound or guarding  Lab Results:  Recent Labs    11/18/22 2300 11/19/22 0607  WBC 7.2 5.2  HGB 13.1 12.6  HCT 38.0 36.9  PLT 380 400   BMET Recent Labs    11/19/22 1255 11/19/22 1905  NA 135 127*  K 3.9 3.7  CL 99 94*  CO2 24 22  GLUCOSE 90 90  BUN <5* 5*  CREATININE 0.60 0.77  CALCIUM 8.9 8.4*   PT/INR Recent Labs    11/18/22 2300  LABPROT 13.2  INR 1.0   ABG No results for input(s): "PHART", "HCO3" in the last 72 hours.  Invalid input(s): "PCO2", "PO2"  Studies/Results: CT ABDOMEN PELVIS W CONTRAST  Result Date: 11/18/2022 CLINICAL DATA:  Periumbilical pain for 2-3 months EXAM: CT ABDOMEN AND PELVIS WITH CONTRAST TECHNIQUE: Multidetector CT imaging of the abdomen and pelvis was performed using the standard protocol following bolus administration of intravenous contrast. RADIATION DOSE REDUCTION: This exam was performed according to the departmental dose-optimization program which includes automated exposure control, adjustment of the mA and/or kV according to patient size and/or use of iterative reconstruction technique. CONTRAST:  11m OMNIPAQUE IOHEXOL 300 MG/ML  SOLN COMPARISON:  04/07/2017 FINDINGS: Lower chest: No acute  abnormality. Hepatobiliary: No solid liver abnormality is seen. No gallstones, gallbladder wall thickening, or biliary dilatation. Pancreas: Unremarkable. No pancreatic ductal dilatation or surrounding inflammatory changes. Spleen: Normal in size without significant abnormality. Adrenals/Urinary Tract: Adrenal glands are unremarkable. Simple, benign right renal cortical cyst, for which no further follow-up or characterization is required. Kidneys are otherwise normal, without renal calculi, solid lesion, or hydronephrosis. Bladder is unremarkable. Stomach/Bowel: Stomach is within normal limits. Normal appendix. Severe descending and sigmoid diverticulosis. Long segment wall thickening of the mid to distal sigmoid colon (series 2, image 50). Probable contained perforation arising from the superior aspect of the distal sigmoid measuring 3.5 x 2.2 x 2.2 cm (series 3, image 47, series 5, image 47). Vascular/Lymphatic: Aortic atherosclerosis. No enlarged abdominal or pelvic lymph nodes. Reproductive: Status post hysterectomy. Other: No abdominal wall hernia or abnormality. No ascites. Musculoskeletal: No acute or significant osseous findings. IMPRESSION: 1. Severe descending and sigmoid diverticulosis. Long segment wall thickening of the mid to distal sigmoid colon, consistent with diverticulitis, possibly both acute and chronic inflammation. 2. Probable contained perforation arising from the superior aspect of the distal sigmoid measuring 3.5 x 2.2 x 2.2 cm. 3. Status post hysterectomy. These results were called by telephone at the time of interpretation on 11/18/2022 at 5:36 pm to Dr. JJuanita Craver, who verbally acknowledged these results. Aortic Atherosclerosis (ICD10-I70.0). Electronically Signed   By: ADelanna AhmadiM.D.   On: 11/18/2022 17:36    Anti-infectives: Anti-infectives (From admission, onward)    Start     Dose/Rate Route Frequency Ordered Stop  11/19/22 0800  piperacillin-tazobactam (ZOSYN) IVPB 3.375  g        3.375 g 12.5 mL/hr over 240 Minutes Intravenous Every 8 hours 11/19/22 0526     11/18/22 2245  ceFEPIme (MAXIPIME) 2 g in sodium chloride 0.9 % 100 mL IVPB        2 g 200 mL/hr over 30 Minutes Intravenous  Once 11/18/22 2230 11/18/22 2339   11/18/22 2245  metroNIDAZOLE (FLAGYL) IVPB 500 mg        500 mg 100 mL/hr over 60 Minutes Intravenous  Once 11/18/22 2230 11/19/22 0050       Assessment/Plan: Acute diverticulitis with possible perforation without abscess or diffuse peritonitis   Improved on antibiotics today  Allow clear liquids  Continue IV fluids  CCS will follow hopefully she progresses over the next day or 2 to oral antibiotics and discharge  Recommend CT scanning in 5 days to reassess.  This can be done as outpatient if patient clinically improved significantly.  CCS to follow up on Monday   I  I personally reviewed images of CT scan showing diverticulitis with questionable perforation. I reviewed recent lab values, vitals, and notes.  This care required straight-forward level of medical decision making.    LOS: 1 day    Turner Daniels MD 11/20/2022

## 2022-11-21 DIAGNOSIS — E871 Hypo-osmolality and hyponatremia: Secondary | ICD-10-CM | POA: Diagnosis not present

## 2022-11-21 DIAGNOSIS — I7121 Aneurysm of the ascending aorta, without rupture: Secondary | ICD-10-CM | POA: Diagnosis not present

## 2022-11-21 DIAGNOSIS — F1020 Alcohol dependence, uncomplicated: Secondary | ICD-10-CM | POA: Diagnosis not present

## 2022-11-21 DIAGNOSIS — K5720 Diverticulitis of large intestine with perforation and abscess without bleeding: Secondary | ICD-10-CM | POA: Diagnosis not present

## 2022-11-21 LAB — GLUCOSE, CAPILLARY
Glucose-Capillary: 136 mg/dL — ABNORMAL HIGH (ref 70–99)
Glucose-Capillary: 120 mg/dL — ABNORMAL HIGH (ref 70–99)
Glucose-Capillary: 159 mg/dL — ABNORMAL HIGH (ref 70–99)

## 2022-11-21 LAB — BASIC METABOLIC PANEL
Anion gap: 7 (ref 5–15)
BUN: <5 mg/dL — ABNORMAL LOW (ref 8–23)
CO2: 25 mmol/L (ref 22–32)
Calcium: 8.6 mg/dL — ABNORMAL LOW (ref 8.9–10.3)
Chloride: 101 mmol/L (ref 98–111)
Creatinine, Ser: 0.78 mg/dL (ref 0.44–1.00)
GFR, Estimated: >60 mL/min (ref >60)
Glucose, Bld: 144 mg/dL — ABNORMAL HIGH (ref 70–99)
Potassium: 3.9 mmol/L (ref 3.5–5.1)
Sodium: 133 mmol/L — ABNORMAL LOW (ref 135–145)

## 2022-11-21 NOTE — Progress Notes (Signed)
Subjective: CC: Doing well. 2/10 LLQ pain which is much improved from admission. Tolerating cld without n/v. Passing flatus. Small liquid bm yesterday.   Objective: Vital signs in last 24 hours: Temp:  [98.4 F (36.9 C)-98.9 F (37.2 C)] 98.7 F (37.1 C) (02/19 0509) Pulse Rate:  [44-57] 44 (02/19 0509) Resp:  [18-20] 18 (02/19 0509) BP: (139-165)/(82-100) 165/82 (02/19 0509) SpO2:  [93 %-100 %] 93 % (02/19 0509) Last BM Date : 11/20/22  Intake/Output from previous day: 02/18 0701 - 02/19 0700 In: 1780 [P.O.:1780] Out: 4725 [Urine:4725] Intake/Output this shift: Total I/O In: 240 [P.O.:240] Out: 800 [Urine:800]  PE: Gen:  Alert, NAD, pleasant Abd: Soft, ND, very mild llq ttp without rigidity or guarding, +BS  Lab Results:  Recent Labs    11/18/22 2300 11/19/22 0607  WBC 7.2 5.2  HGB 13.1 12.6  HCT 38.0 36.9  PLT 380 400   BMET Recent Labs    11/19/22 1255 11/19/22 1905 11/20/22 1101  NA 135 127* 136  K 3.9 3.7  --   CL 99 94*  --   CO2 24 22  --   GLUCOSE 90 90  --   BUN <5* 5*  --   CREATININE 0.60 0.77  --   CALCIUM 8.9 8.4*  --    PT/INR Recent Labs    11/18/22 2300  LABPROT 13.2  INR 1.0   CMP     Component Value Date/Time   NA 136 11/20/2022 1101   NA 138 09/12/2022 1301   K 3.7 11/19/2022 1905   CL 94 (L) 11/19/2022 1905   CO2 22 11/19/2022 1905   GLUCOSE 90 11/19/2022 1905   BUN 5 (L) 11/19/2022 1905   BUN 4 (L) 09/12/2022 1301   CREATININE 0.77 11/19/2022 1905   CALCIUM 8.4 (L) 11/19/2022 1905   PROT 6.0 (L) 11/19/2022 0607   PROT 6.8 09/12/2022 1301   ALBUMIN 3.3 (L) 11/19/2022 0607   ALBUMIN 4.5 09/12/2022 1301   AST 18 11/19/2022 0607   ALT 12 11/19/2022 0607   ALKPHOS 47 11/19/2022 0607   BILITOT 0.4 11/19/2022 0607   BILITOT <0.2 09/12/2022 1301   GFRNONAA >60 11/19/2022 1905   GFRAA 90 07/27/2020 1513   Lipase     Component Value Date/Time   LIPASE 17 10/23/2015 1250    Studies/Results: No results  found.  Anti-infectives: Anti-infectives (From admission, onward)    Start     Dose/Rate Route Frequency Ordered Stop   11/19/22 0800  piperacillin-tazobactam (ZOSYN) IVPB 3.375 g        3.375 g 12.5 mL/hr over 240 Minutes Intravenous Every 8 hours 11/19/22 0526     11/18/22 2245  ceFEPIme (MAXIPIME) 2 g in sodium chloride 0.9 % 100 mL IVPB        2 g 200 mL/hr over 30 Minutes Intravenous  Once 11/18/22 2230 11/18/22 2339   11/18/22 2245  metroNIDAZOLE (FLAGYL) IVPB 500 mg        500 mg 100 mL/hr over 60 Minutes Intravenous  Once 11/18/22 2230 11/19/22 0050        Assessment/Plan Contained perforated sigmoid diverticulitis - Patient doing well. Abdominal pain improved, tolerating cld, minimally ttp in LLQ, wbc normalized 2/17. Will ADAT. If tolerates diet advancement, okay for d/c later today on total of 10-14d of abx since admission. Will reach out to [facility] to let them know. Discussed case with attending. No need for repeat CT scan as outpatient unless patient has  clinical change. She also does not need direct f/u with us/CRS but can be referred by Dr. Hoyt Koch prn. Discussed discharge instructions, restrictions and return/call back precautions   FEN - FLD, ADAT. IVF per [facility] VTE - SCDs, okay for chem ppx from a general surgery standpoint ID - Zosyn   I reviewed nursing notes, hospitalist notes, last 24 h vitals and pain scores, last 48 h intake and output, last 24 h labs and trends, and last 24 h imaging results.    LOS: 2 days    Jillyn Ledger , [facility] 11/21/2022, 10:03 AM Please see Amion for pager number during day hours 7:00am-4:30pm

## 2022-11-21 NOTE — Progress Notes (Signed)
PROGRESS NOTE    Maureen Rodgers  [id number] DOB: 10-Dec-1950 DOA: 11/18/2022 PCP: Maureen Chard, MD   Brief Narrative: Maureen Rodgers is a 72 y.o. female with a history of depression, anxiety, alcohol abuse, hypertension, diverticulitis. Patient presented secondary to abdominal pain and was found to have evidence of acute diverticulitis with associated contained perforation. General surgery consulted. Conservative management.   Assessment and Plan:  Sigmoid diverticulitis with contained perforation Noted on CT abdomen/pelvis. Patient started empirically on Cefepime/Flagyl in the ED and transitioned to Zosyn on admission. Blood cultures obtained. General surgery consulted. -General surgery recommendations: Advance diet, recommendation for antibiotics x 10-14 days -Continue Zosyn  Bradycardia Asymptomatic. EKG with junctional rhythm.  -Cardiology consult -Telemetry  Hyponatremia Sodium of 125 on admission. Secondary to dehydration. Hyponatremia resolved with IV fluids.  Alcohol use disorder Noted. Patient started on CIWA protocol. -Continue CIWA  Anxiety Depression Patient is on Xanax, Cymbalta and trazodone as an outpatient. -Continue home Cymbalta and trazodone  Hypertension Noted in history. Patient is not on medication therapy. Would recommend treatment secondary to presence of thoracic AA. -Continue amlodipine 5 mg daily  Ascending aortic aneurysm Noted.   GERD Patient is on Nexium as an outpatient.   DVT prophylaxis: SCDs Code Status:   Code Status: Full Code Family Communication: None at bedside Disposition Plan: Discharge home likely in 2 days   Consultants:  General surgery  Procedures:  None  Antimicrobials: Cefepime Flagyl Zosyn    Subjective: Patient with improvement of abdominal pain. No issues overnight.  Objective: BP 138/79 (BP Location: Right Arm)   Pulse (!) 56   Temp 98.8 F (37.1 C) (Oral)   Resp 18   Ht 5' 2"$   (1.575 m)   Wt 52.6 kg   SpO2 100%   BMI 21.22 kg/m   Examination:  General exam: Appears calm and comfortable Respiratory system: Clear to auscultation. Respiratory effort normal. Cardiovascular system: S1 & S2 heard, RRR. No murmurs, rubs, gallops or clicks. Gastrointestinal system: Abdomen is nondistended, soft and with mild tenderness in LLQ. Normal bowel sounds heard. Central nervous system: Alert and oriented. Musculoskeletal: No edema. No calf tenderness Psychiatry: Judgement and insight appear normal. Mood & affect appropriate.    Data Reviewed: I have personally reviewed following labs and imaging studies  CBC Lab Results  Component Value Date   WBC 5.2 11/19/2022   RBC 3.88 11/19/2022   HGB 12.6 11/19/2022   HCT 36.9 11/19/2022   MCV 95.1 11/19/2022   MCH 32.5 11/19/2022   PLT 400 11/19/2022   MCHC 34.1 11/19/2022   RDW 12.3 11/19/2022   LYMPHSABS 2.4 11/19/2022   MONOABS 0.4 11/19/2022   EOSABS 0.2 11/19/2022   BASOSABS 0.1 [id number]     Last metabolic panel Lab Results  Component Value Date   NA 133 (L) 11/21/2022   K 3.9 11/21/2022   CL 101 11/21/2022   CO2 25 11/21/2022   BUN <5 (L) 11/21/2022   CREATININE 0.78 11/21/2022   GLUCOSE 144 (H) 11/21/2022   GFRNONAA >60 11/21/2022   GFRAA 90 07/27/2020   CALCIUM 8.6 (L) 11/21/2022   PROT 6.0 (L) 11/19/2022   ALBUMIN 3.3 (L) 11/19/2022   LABGLOB 2.3 09/12/2022   AGRATIO 2.0 09/12/2022   BILITOT 0.4 11/19/2022   ALKPHOS 47 11/19/2022   AST 18 11/19/2022   ALT 12 11/19/2022   ANIONGAP 7 11/21/2022    GFR: Estimated Creatinine Clearance: 51 mL/min (by C-G formula based on SCr of 0.78 mg/dL).  Recent  Results (from the past 240 hour(s))  Blood Culture (routine x 2)     Status: None (Preliminary result)   Collection Time: 11/18/22 11:00 PM   Specimen: BLOOD RIGHT WRIST  Result Value Ref Range Status   Specimen Description   Final    BLOOD RIGHT WRIST Performed at [facility], [address], [place], [place] [address]    Special Requests   Final    BOTTLES DRAWN AEROBIC AND ANAEROBIC Blood Culture adequate volume Performed at [facility] [address] [place], [place] [address]    Culture   Final    NO GROWTH 2 DAYS Performed at [facility] [address], [place], [place] [address]    Report Status PENDING  Incomplete  Blood Culture (routine x 2)     Status: None (Preliminary result)   Collection Time: 11/18/22 11:06 PM   Specimen: BLOOD RIGHT FOREARM  Result Value Ref Range Status   Specimen Description   Final    BLOOD RIGHT FOREARM Performed at [facility], [place] [address], [place], [place] [address]    Special Requests   Final    BOTTLES DRAWN AEROBIC AND ANAEROBIC Blood Culture adequate volume Performed at [facility] [address], [place], [place] [address]    Culture   Final    NO GROWTH 2 DAYS Performed at [facility] [address] [place], [place] [address]    Report Status PENDING  Incomplete  Resp panel by RT-PCR (RSV, Flu A&B, Covid) Anterior Nasal Swab     Status: None   Collection Time: 11/18/22 11:37 PM   Specimen: Anterior Nasal Swab  Result Value Ref Range Status   SARS Coronavirus 2 by RT PCR NEGATIVE NEGATIVE Final    Comment: (NOTE) SARS-CoV-2 target nucleic acids are NOT DETECTED.  The SARS-CoV-2 RNA is generally detectable in upper respiratory specimens during the acute phase of infection. The lowest concentration of SARS-CoV-2 viral copies this assay can detect is 138 copies/mL. A negative result does not preclude SARS-Cov-2 infection and should not be used as the sole basis for treatment or other patient management decisions. A negative result may occur with  improper specimen collection/handling, submission of specimen other than nasopharyngeal swab, presence of viral mutation(s) within the areas targeted by this assay, and inadequate number of  viral copies(<138 copies/mL). A negative result must be combined with clinical observations, patient history, and epidemiological information. The expected result is Negative.  Fact Sheet for Patients:  EntrepreneurPulse.com.au  Fact Sheet for Healthcare Providers:  IncredibleEmployment.be  This test is no t yet approved or cleared by the [place] FDA and  has been authorized for detection and/or diagnosis of SARS-CoV-2 by FDA under an Emergency Use Authorization (EUA). This EUA will remain  in effect (meaning this test can be used) for the duration of the COVID-19 declaration under Section 564(b)(1) of the Act, 21 U.S.C.section 360bbb-3(b)(1), unless the authorization is terminated  or revoked sooner.       Influenza A by PCR NEGATIVE NEGATIVE Final   Influenza B by PCR NEGATIVE NEGATIVE Final    Comment: (NOTE) The Xpert Xpress SARS-CoV-2/FLU/RSV plus assay is intended as an aid in the diagnosis of influenza from Nasopharyngeal swab specimens and should not be used as a sole basis for treatment. Nasal washings and aspirates are unacceptable for Xpert Xpress SARS-CoV-2/FLU/RSV testing.  Fact Sheet for Patients: EntrepreneurPulse.com.au  Fact Sheet for Healthcare Providers: IncredibleEmployment.be  This test is not yet approved or cleared by the [place] and has been authorized for detection and/or diagnosis of SARS-CoV-2 by FDA under an Emergency Use Authorization (EUA). This EUA will remain in effect (meaning this test can be used) for the duration of the COVID-19 declaration under Section 564(b)(1) of the Act, 21 U.S.C. section 360bbb-3(b)(1), unless the authorization is terminated or revoked.     Resp Syncytial Virus by PCR NEGATIVE NEGATIVE Final    Comment: (NOTE) Fact Sheet for Patients: EntrepreneurPulse.com.au  Fact Sheet for Healthcare  Providers: IncredibleEmployment.be  This test is not yet approved or cleared by the [place] FDA and has been authorized for detection and/or diagnosis of SARS-CoV-2 by FDA under an Emergency Use Authorization (EUA). This EUA will remain in effect (meaning this test can be used) for the duration of the COVID-19 declaration under Section 564(b)(1) of the Act, 21 U.S.C. section 360bbb-3(b)(1), unless the authorization is terminated or revoked.  Performed at [facility], [place] [address] [place], [place] [address]       Radiology Studies: No results found.    LOS: 2 days    Cordelia Poche, MD [facility] 11/21/2022, 3:22 PM   If 7PM-7AM, please contact night-coverage [email or web address]

## 2022-11-22 ENCOUNTER — Other Ambulatory Visit: Payer: Self-pay | Admitting: Physician Assistant

## 2022-11-22 ENCOUNTER — Other Ambulatory Visit: Payer: Self-pay

## 2022-11-22 DIAGNOSIS — R001 Bradycardia, unspecified: Secondary | ICD-10-CM

## 2022-11-22 DIAGNOSIS — K5720 Diverticulitis of large intestine with perforation and abscess without bleeding: Secondary | ICD-10-CM | POA: Diagnosis not present

## 2022-11-22 LAB — GLUCOSE, CAPILLARY
Glucose-Capillary: 100 mg/dL — ABNORMAL HIGH (ref 70–99)
Glucose-Capillary: 128 mg/dL — ABNORMAL HIGH (ref 70–99)

## 2022-11-22 MED ORDER — AMLODIPINE BESYLATE 10 MG PO TABS: 10.0000 mg | ORAL_TABLET | Freq: Every day | ORAL | 2 refills | Status: DC

## 2022-11-22 MED ORDER — AMOXICILLIN-POT CLAVULANATE 875-125 MG PO TABS: 1.0000 | ORAL_TABLET | Freq: Three times a day (TID) | ORAL | 0 refills | Status: AC

## 2022-11-22 MED ORDER — ALPRAZOLAM 0.5 MG PO TABS
1.0000 mg | ORAL_TABLET | Freq: Three times a day (TID) | ORAL | Status: DC | PRN
  Administered 2022-11-22: 1 mg via ORAL
  Filled 2022-11-22: qty 2

## 2022-11-22 NOTE — Plan of Care (Signed)
  Problem: Education: Goal: Knowledge of General Education information will improve Description: Including pain rating scale, medication(s)/side effects and non-pharmacologic comfort measures Outcome: Completed/Met   Problem: Health Behavior/Discharge Planning: Goal: Ability to manage health-related needs will improve 11/22/2022 1337 by Annie Sable, RN Outcome: Completed/Met 11/22/2022 0851 by Annie Sable, RN Outcome: Progressing   Problem: Clinical Measurements: Goal: Ability to maintain clinical measurements within normal limits will improve 11/22/2022 1337 by Annie Sable, RN Outcome: Completed/Met 11/22/2022 0851 by Annie Sable, RN Outcome: Progressing Goal: Will remain free from infection 11/22/2022 1337 by Annie Sable, RN Outcome: Completed/Met 11/22/2022 0851 by Annie Sable, RN Outcome: Progressing Goal: Diagnostic test results will improve 11/22/2022 1337 by Annie Sable, RN Outcome: Completed/Met 11/22/2022 0851 by Annie Sable, RN Outcome: Progressing Goal: Respiratory complications will improve Outcome: Completed/Met Goal: Cardiovascular complication will be avoided 11/22/2022 1337 by Annie Sable, RN Outcome: Completed/Met 11/22/2022 0851 by Annie Sable, RN Outcome: Progressing   Problem: Activity: Goal: Risk for activity intolerance will decrease Outcome: Completed/Met   Problem: Nutrition: Goal: Adequate nutrition will be maintained Outcome: Completed/Met   Problem: Coping: Goal: Level of anxiety will decrease 11/22/2022 1337 by Annie Sable, RN Outcome: Completed/Met 11/22/2022 0851 by Annie Sable, RN Outcome: Progressing   Problem: Elimination: Goal: Will not experience complications related to bowel motility Outcome: Completed/Met Goal: Will not experience complications related to urinary retention Outcome: Completed/Met   Problem: Pain Managment: Goal: General experience of comfort will improve Outcome:  Completed/Met   Problem: Safety: Goal: Ability to remain free from injury will improve 11/22/2022 1337 by Annie Sable, RN Outcome: Completed/Met 11/22/2022 0851 by Annie Sable, RN Outcome: Progressing   Problem: Skin Integrity: Goal: Risk for impaired skin integrity will decrease 11/22/2022 1337 by Annie Sable, RN Outcome: Completed/Met 11/22/2022 0851 by Annie Sable, RN Outcome: Progressing

## 2022-11-22 NOTE — Plan of Care (Signed)
  Problem: Health Behavior/Discharge Planning: Goal: Ability to manage health-related needs will improve Outcome: Progressing   Problem: Clinical Measurements: Goal: Ability to maintain clinical measurements within normal limits will improve Outcome: Progressing Goal: Will remain free from infection Outcome: Progressing Goal: Diagnostic test results will improve Outcome: Progressing Goal: Cardiovascular complication will be avoided Outcome: Progressing   Problem: Coping: Goal: Level of anxiety will decrease Outcome: Progressing   Problem: Safety: Goal: Ability to remain free from injury will improve Outcome: Progressing   Problem: Skin Integrity: Goal: Risk for impaired skin integrity will decrease Outcome: Progressing   Problem: Education: Goal: Knowledge of General Education information will improve Description: Including pain rating scale, medication(s)/side effects and non-pharmacologic comfort measures Outcome: Completed/Met   Problem: Clinical Measurements: Goal: Respiratory complications will improve Outcome: Completed/Met   Problem: Activity: Goal: Risk for activity intolerance will decrease Outcome: Completed/Met   Problem: Nutrition: Goal: Adequate nutrition will be maintained Outcome: Completed/Met   Problem: Elimination: Goal: Will not experience complications related to bowel motility Outcome: Completed/Met Goal: Will not experience complications related to urinary retention Outcome: Completed/Met   Problem: Pain Managment: Goal: General experience of comfort will improve Outcome: Completed/Met

## 2022-11-22 NOTE — Transitions of Care (Post Inpatient/ED Visit) (Signed)
   11/22/2022  Name: Maureen Rodgers MRN: [id number] DOB: Sep 15, 1951  Today's TOC FU Call Status: Today's TOC FU Call Status:: Unsuccessul Call (1st Attempt) Unsuccessful Call (1st Attempt) Date: 11/22/22  Attempted to reach the patient regarding the most recent Inpatient/ED visit.  Follow Up Plan: Additional outreach attempts will be made to reach the patient to complete the Transitions of Care (Post Inpatient/ED visit) call.   Signature Tami Lin, [place]

## 2022-11-22 NOTE — Consult Note (Addendum)
Cardiology Consultation   Patient ID: Maureen Rodgers MRN: [id number]; DOB: 07-01-1951  Admit date: 11/18/2022 Date of Consult: 11/22/2022  PCP:  Glendale Chard, [place] [facility] Cardiologist:  Skeet Latch, MD        Patient Profile:   Maureen Rodgers is a 72 y.o. female with a hx of aortic atherosclerosis, tobacco and alcohol abuse, hyperlipidemia, depression and dilated thoracic aorta who is being seen 11/22/2022 for the evaluation of bradycardia at the request of Dr. Lonny Prude.  History of Present Illness:   Maureen Rodgers is a 72 year old female with past medical history of aortic atherosclerosis, tobacco and alcohol abuse, hyperlipidemia, depression and dilated thoracic aorta.  Echocardiogram in December 2022 showed EF 60 to 65%, mild LVH, grade 1 DD, mild posterior valve leaflet prolapse with no regurgitation.  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  She quit smoking during pandemic.  She became quite sedentary after pandemic and at the loss of her mother.  Myoview performed in March 2023 was low risk study.  She was most recently seen by [name] on 05/02/2022 at which time she was doing well. Most recent echocardiogram obtained on 09/05/2022 showed EF of 60 to 65%, moderate LVH, trivial MR, ascending aorta measuring at 36 mm.   Patient was admitted to the hospital on 11/19/2022 at the recommendation of Dr. Collene Mares, her GI physician after abdominal CT obtained on 11/18/2022 demonstrated severe descending sigmoid diverticulosis was long segmental wall thickening consistent with diverticulitis, possible contained perforation arising from the superior aspect of the distal sigmoid measuring 3.5 x 2.2 x 2.2 cm.  Talking with the patient, 2 weeks ago, she had episode of abdominal discomfort which she managed conservatively.  After she was admitted to the hospital, general surgery was consulted and she was treated with antibiotic.  During  hospitalization, patient had asymptomatic bradycardia with heart rate in the 40s overnight, heart rate does brace to 50s during the day.  There is also possible intermittent junctional beats as well.  Cardiology service consulted for bradycardia.  She is not on any AV nodal blocking agent.  She is on Ativan 3 times a day for anxiety.  Past Medical History:  Diagnosis Date   Ascending aortic aneurysm ([place]) 11/29/2021   Depression    Essential hypertension 11/29/2021   GERD (gastroesophageal reflux disease)    Tobacco use     Past Surgical History:  Procedure Laterality Date   ABDOMINAL HYSTERECTOMY     BILATERAL CARPAL TUNNEL RELEASE  2018   2018 on Right, many years ago on left   COLONOSCOPY WITH PROPOFOL N/A 11/26/2021   Procedure: COLONOSCOPY WITH PROPOFOL;  Surgeon: Carol Ada, MD;  Location: [facility];  Service: Endoscopy;  Laterality: N/A;   ESOPHAGOGASTRODUODENOSCOPY (EGD) WITH PROPOFOL N/A 11/26/2021   Procedure: ESOPHAGOGASTRODUODENOSCOPY (EGD) WITH PROPOFOL;  Surgeon: Carol Ada, MD;  Location: [facility];  Service: Endoscopy;  Laterality: N/A;   EYE SURGERY     HEMOSTASIS CLIP PLACEMENT  11/26/2021   Procedure: HEMOSTASIS CLIP PLACEMENT;  Surgeon: Carol Ada, MD;  Location: [facility];  Service: Endoscopy;;   HOT HEMOSTASIS N/A 11/26/2021   Procedure: HOT HEMOSTASIS (ARGON PLASMA COAGULATION/BICAP);  Surgeon: Carol Ada, MD;  Location: [facility];  Service: Endoscopy;  Laterality: N/A;  EGD and COLON   INCISION AND DRAINAGE PERIRECTAL ABSCESS N/A 02/27/2020   Procedure: IRRIGATION AND DEBRIDEMENT PERIRECTAL ABSCESS;  Surgeon: Donnie Mesa, MD;  Location: [facility];  Service: General;  Laterality:  N/A;   IRRIGATION AND DEBRIDEMENT ABSCESS  02/27/2020   Procedure(s) Performed: IRRIGATION AND DEBRIDEMENT PERIRECTAL ABSCESS (N/A Perineum)   POLYPECTOMY  11/26/2021   Procedure: POLYPECTOMY;  Surgeon: Carol Ada, MD;  Location: [facility];  Service: Endoscopy;;      Home Medications:  Prior to Admission medications   Medication Sig Start Date End Date Taking? Authorizing Provider  alendronate (FOSAMAX) 70 MG tablet Take 1 tablet (70 mg total) by mouth every Sunday. 10/23/22  Yes Glendale Chard, MD  ALPRAZolam Duanne Moron) 1 MG tablet Take 1 mg by mouth 3 (three) times daily as needed for anxiety or sleep.   Yes [provider]  Calcium Carbonate-Vit D-Min (CALCIUM 1200 PO) Take 1 tablet by mouth daily.   Yes [provider]  Carboxymethylcellulose Sodium (EYE DROPS OP) Place 1 drop into both eyes 3 (three) times daily as needed (dry eyes).   Yes [provider]  DULoxetine (CYMBALTA) 60 MG capsule Take 60 mg by mouth at bedtime.   Yes [provider]  esomeprazole (NEXIUM) 40 MG capsule Take 40 mg by mouth daily at 12 noon.   Yes [provider]  fluticasone (FLONASE) 50 MCG/ACT nasal spray Place 1 spray into both nostrils daily. Patient taking differently: Place 1 spray into both nostrils daily as needed for allergies. 09/07/18  Yes Minette Brine, FNP  Iron-FA-B Cmp-C-Biot-Probiotic (FUSION PLUS) CAPS TAKE 1 CAPSULE BY MOUTH MONDAY THROUGH [facility] 08/22/22  Yes Glendale Chard, MD  Magnesium 400 MG TABS Take 1 tablet by mouth daily.   Yes [provider]  Omega-3 Fatty Acids (FISH OIL) 1000 MG CAPS Take 1,000 mg by mouth daily.    Yes [provider]  traZODone (DESYREL) 100 MG tablet Take 100 mg by mouth at bedtime.   Yes [provider]    Inpatient Medications: Scheduled Meds:  amLODipine  5 mg Oral Daily   DULoxetine  60 mg Oral QHS   folic acid  1 mg Oral Daily   LORazepam  0-4 mg Intravenous Q12H   multivitamin with minerals  1 tablet Oral Daily   pantoprazole  40 mg Oral Daily   thiamine  100 mg Oral Daily   Or   thiamine  100 mg Intravenous Daily   traZODone  100 mg Oral QHS   Continuous Infusions:  piperacillin-tazobactam (ZOSYN)  IV 3.375 g (11/22/22 0838)   PRN  Meds: ALPRAZolam, fentaNYL (SUBLIMAZE) injection  Allergies:   No Known Allergies  Social History:   Social History   Socioeconomic History   Marital status: Single    Spouse name: Not on file   Number of children: Not on file   Years of education: Not on file   Highest education level: Not on file  Occupational History   Occupation: retired  Tobacco Use   Smoking status: Every Day    Packs/day: 0.75    Years: 44.00    Total pack years: 33.00    Types: Cigarettes    Start date: 10/03/1974   Smokeless tobacco: Never   Tobacco comments:    has been smoking 1ppd x 18 months, in addition to above history. Right now, she is not ready to quit. Currently in therapy. She is smoking no more than 15 cigs/day.   Vaping Use   Vaping Use: Never used  Substance and Sexual Activity   Alcohol use: Yes    Alcohol/week: 2.0 standard drinks of alcohol    Types: 2 Cans of beer per week  Comment: 3 beers a day   Drug use: No   Sexual activity: Not Currently  Other Topics Concern   Not on file  Social History Narrative   Not on file   Social Determinants of Health   Financial Resource Strain: Low Risk  (03/24/2022)   Overall Financial Resource Strain (CARDIA)    Difficulty of Paying Living Expenses: Not hard at all  Food Insecurity: No Food Insecurity (11/19/2022)   Hunger Vital Sign    Worried About Running Out of Food in the Last Year: Never true    Ran Out of Food in the Last Year: Never true  Transportation Needs: No Transportation Needs (11/19/2022)   PRAPARE - Hydrologist (Medical): No    Lack of Transportation (Non-Medical): No  Physical Activity: Insufficiently Active (03/24/2022)   Exercise Vital Sign    Days of Exercise per Week: 2 days    Minutes of Exercise per Session: 60 min  Stress: Stress Concern Present (03/24/2022)   [place]    Feeling of Stress : To some extent  Social  Connections: Not on file  Intimate Partner Violence: Not At Risk (11/19/2022)   Humiliation, Afraid, Rape, and Kick questionnaire    Fear of Current or Ex-Partner: No    Emotionally Abused: No    Physically Abused: No    Sexually Abused: No    Family History:    Family History  Problem Relation Age of Onset   Heart disease Mother    Dementia Mother    Alcohol abuse Mother    Alcohol abuse Father    Heart disease Brother    Diabetes Brother    Heart attack Maternal Grandmother      ROS:  Please see the history of present illness.   All other ROS reviewed and negative.     Physical Exam/Data:   Vitals:   11/21/22 0509 11/21/22 1439 11/21/22 2025 11/22/22 0531  BP: (!) 165/82 138/79 (!) 154/90 (!) 168/83  Pulse: (!) 44 (!) 56 (!) 53 (!) 51  Resp: 18 18 20 20  $ Temp: 98.7 F (37.1 C) 98.8 F (37.1 C) 98.4 F (36.9 C) 98 F (36.7 C)  TempSrc: Oral Oral Oral Oral  SpO2: 93% 100% 100% 100%  Weight:      Height:        Intake/Output Summary (Last 24 hours) at 11/22/2022 1024 Last data filed at 11/22/2022 0800 Gross per 24 hour  Intake 5555.83 ml  Output 4600 ml  Net 955.83 ml      11/19/2022    7:31 AM 09/12/2022   11:43 AM 05/02/2022   10:00 AM  Last 3 Weights  Weight (lbs) 116 lb 115 lb 6.4 oz 118 lb  Weight (kg) 52.617 kg 52.345 kg 53.524 kg     Body mass index is 21.22 kg/m.  General:  Well nourished, well developed, in no acute distress HEENT: normal Neck: no JVD Vascular: No carotid bruits; Distal pulses 2+ bilaterally Cardiac:  normal S1, S2; RRR; no murmur  Lungs:  clear to auscultation bilaterally, no wheezing, rhonchi or rales  Abd: soft, nontender, no hepatomegaly  Ext: no edema Musculoskeletal:  No deformities, BUE and BLE strength normal and equal Skin: warm and dry  Neuro:  CNs 2-12 intact, no focal abnormalities noted Psych:  Normal affect   EKG:  The EKG was personally reviewed and demonstrates: Sinus bradycardia, occasional dropped P wave  consistent with intermittent  junctional beat. Telemetry:  Telemetry was personally reviewed and demonstrates: Sinus bradycardia with heart rate in the mid 40s overnight, heart rate does increase to 50s during the day.  Relevant CV Studies:  Echo 09/05/2022  1. Left ventricular ejection fraction, by estimation, is 60 to 65%. The  left ventricle has normal function. The left ventricle has no regional  wall motion abnormalities. There is moderate concentric left ventricular  hypertrophy. Left ventricular  diastolic parameters are indeterminate.   2. Right ventricular systolic function is normal. The right ventricular  size is normal. There is normal pulmonary artery systolic pressure.   3. The mitral valve is grossly normal. Trivial mitral valve  regurgitation. No evidence of mitral stenosis.   4. The aortic valve is grossly normal. Aortic valve regurgitation is not  visualized. No aortic stenosis is present.   5. The inferior vena cava is normal in size with greater than 50%  respiratory variability, suggesting right atrial pressure of 3 mmHg.   Comparison(s): No significant change from prior study. Ascending aorta  unchanged on current imaging (72m).   Conclusion(s)/Recommendation(s): Otherwise normal echocardiogram, with  minor abnormalities described in the report. No significant aortic  dilation on available images. Stable from prior echo. Consider following  by CT or MRI if there is concern for  significant dilation.   Laboratory Data:  High Sensitivity Troponin:  No results for input(s): "TROPONINIHS" in the last 720 hours.   Chemistry Recent Labs  Lab 11/19/22 1255 11/19/22 1905 11/20/22 1101 11/21/22 1009  NA 135 127* 136 133*  K 3.9 3.7  --  3.9  CL 99 94*  --  101  CO2 24 22  --  25  GLUCOSE 90 90  --  144*  BUN <5* 5*  --  <5*  CREATININE 0.60 0.77  --  0.78  CALCIUM 8.9 8.4*  --  8.6*  GFRNONAA >60 >60  --  >60  ANIONGAP 12 11  --  7    Recent Labs  Lab  11/18/22 2300 11/19/22 0607  PROT 6.8 6.0*  ALBUMIN 3.5 3.3*  AST 13* 18  ALT 13 12  ALKPHOS 54 47  BILITOT 0.2* 0.4   Lipids No results for input(s): "CHOL", "TRIG", "HDL", "LABVLDL", "LDLCALC", "CHOLHDL" in the last 168 hours.  Hematology Recent Labs  Lab 11/18/22 2300 11/19/22 0607  WBC 7.2 5.2  RBC 4.03 3.88  HGB 13.1 12.6  HCT 38.0 36.9  MCV 94.3 95.1  MCH 32.5 32.5  MCHC 34.5 34.1  RDW 12.3 12.3  PLT 380 400   Thyroid No results for input(s): "TSH", "FREET4" in the last 168 hours.  BNPNo results for input(s): "BNP", "PROBNP" in the last 168 hours.  DDimer No results for input(s): "DDIMER" in the last 168 hours.   Radiology/Studies:  CT ABDOMEN PELVIS W CONTRAST  Addendum Date: 11/21/2022   ADDENDUM REPORT: 11/21/2022 16:52 ADDENDUM: Addendum is made to correct communication information in the originally issued report. The incorrect call report macro text was inserted into the report, with auto population of the ordering physician Dr. MCollene Maresand not the on-call physician that received communication provided by the radiology assistant. Report was called by the radiology assistant to Dr. AHavery Moroson Friday 02/16/2023 at 5:55 p.m. Electronically Signed   By: ADelanna AhmadiM.D.   On: 11/21/2022 16:52   Result Date: 11/21/2022 CLINICAL DATA:  Periumbilical pain for 2-3 months EXAM: CT ABDOMEN AND PELVIS WITH CONTRAST TECHNIQUE: Multidetector CT imaging of the abdomen and pelvis  was performed using the standard protocol following bolus administration of intravenous contrast. RADIATION DOSE REDUCTION: This exam was performed according to the departmental dose-optimization program which includes automated exposure control, adjustment of the mA and/or kV according to patient size and/or use of iterative reconstruction technique. CONTRAST:  159m OMNIPAQUE IOHEXOL 300 MG/ML  SOLN COMPARISON:  04/07/2017 FINDINGS: Lower chest: No acute abnormality. Hepatobiliary: No solid liver  abnormality is seen. No gallstones, gallbladder wall thickening, or biliary dilatation. Pancreas: Unremarkable. No pancreatic ductal dilatation or surrounding inflammatory changes. Spleen: Normal in size without significant abnormality. Adrenals/Urinary Tract: Adrenal glands are unremarkable. Simple, benign right renal cortical cyst, for which no further follow-up or characterization is required. Kidneys are otherwise normal, without renal calculi, solid lesion, or hydronephrosis. Bladder is unremarkable. Stomach/Bowel: Stomach is within normal limits. Normal appendix. Severe descending and sigmoid diverticulosis. Long segment wall thickening of the mid to distal sigmoid colon (series 2, image 50). Probable contained perforation arising from the superior aspect of the distal sigmoid measuring 3.5 x 2.2 x 2.2 cm (series 3, image 47, series 5, image 47). Vascular/Lymphatic: Aortic atherosclerosis. No enlarged abdominal or pelvic lymph nodes. Reproductive: Status post hysterectomy. Other: No abdominal wall hernia or abnormality. No ascites. Musculoskeletal: No acute or significant osseous findings. IMPRESSION: 1. Severe descending and sigmoid diverticulosis. Long segment wall thickening of the mid to distal sigmoid colon, consistent with diverticulitis, possibly both acute and chronic inflammation. 2. Probable contained perforation arising from the superior aspect of the distal sigmoid measuring 3.5 x 2.2 x 2.2 cm. 3. Status post hysterectomy. These results were called by telephone at the time of interpretation on 11/18/2022 at 5:36 pm to Dr. JJuanita Craver, who verbally acknowledged these results. Aortic Atherosclerosis (ICD10-I70.0). Electronically Signed: By: ADelanna AhmadiM.D. On: 11/18/2022 17:36     Assessment and Plan:   Asymptomatic bradycardia  -Based on previous cardiology note and EKG, previous heart rate has been in the 80s and 90s.  During this hospitalization, heart rate overnight has been in the mid  40s to high 40s.  During the day, heart rate does increase to 50%.  Largely in sinus rhythm, however occasionally, P wave does disappear.  Will discuss with MD.  Her recent echocardiogram in December 2023 was normal.  She is asymptomatic with occasional bradycardia, will consider outpatient heart monitor.  Sigmoid diverticulitis with contained perforation: Patient was sent to the hospital by GI service as outpatient CT revealed severe diverticulitis with probable contained perforation.  Managed conservatively with antibiotic.  Followed by general surgery.  Overall doing well and only has 1 out of 10 residual abdominal discomfort at this time.  Hyponatremia: Sodium 125 on arrival, felt to be related to dehydration.  History of EtOH abuse  Hypertension: On home amlodipine    Risk Assessment/Risk Scores:                For questions or updates, please contact CAdamsPlease consult [email or web address] for contact info under    SHilbert Corrigan PUtah 11/22/2022 10:24 AM

## 2022-11-22 NOTE — Discharge Summary (Signed)
Physician Discharge Summary   Patient: Maureen Rodgers MRN: [id number] DOB: 1951-09-03  Admit date:     11/18/2022  Discharge date: 11/22/22  Discharge Physician: Cordelia Poche, MD   PCP: Glendale Chard, MD   Recommendations at discharge:  Hospital follow-up with PCP Continued antibiotics GI follow-up General surgery follow-up as needed Recommend antihypertensive management for aortic aneurysm Cardiology follow-up for consideration of cardiac monitor secondary to bradycardia  Discharge Diagnoses: Principal Problem:   Diverticulitis of colon with perforation Active Problems:   Mixed hyperlipidemia   Moderate episode of recurrent major depressive disorder (HCC)   Cigarette smoker   Ascending aortic aneurysm (HCC)   Alcohol use disorder, moderate, dependence ([place])   Hyponatremia  Resolved Problems:   * No resolved hospital problems. *  Hospital Course: Maureen Rodgers is a 72 y.o. female with a history of depression, anxiety, alcohol abuse, hypertension, diverticulitis. Patient presented secondary to abdominal pain and was found to have evidence of acute diverticulitis with associated contained perforation. General surgery consulted. Conservative management.  Assessment and Plan:  Sigmoid diverticulitis with contained perforation Noted on CT abdomen/pelvis. Patient started empirically on Cefepime/Flagyl in the ED and transitioned to Zosyn on admission. Blood cultures obtained. General surgery consulted and recommended conservative management. Diet advanced successfully. Patient discharged on Augmentin to complete a 10 day course of antibiotics.   Bradycardia Asymptomatic. Sinus bradycardia with occasional junctional rhythm. Cardiology consulted and recommended outpatient follow-up and consideration of monitor.   Hyponatremia Sodium of 125 on admission. Secondary to dehydration. Hyponatremia resolved with IV fluids.   Alcohol use disorder Noted. Patient started on CIWA  protocol.   Anxiety Depression Patient is on Xanax, Cymbalta and trazodone as an outpatient. Continue home Cymbalta and trazodone   Hypertension Noted in history. Patient is not on medication therapy. Would recommend treatment secondary to presence of thoracic AA. Discharge on amlodipine 10 mg daily.   Ascending aortic aneurysm Noted.    GERD Patient is on Nexium as an outpatient.   Consultants: General surgery, Cardiology Procedures performed: Transthoracic Echocardiogram   Disposition: Home Diet recommendation: Cardiac diet   DISCHARGE MEDICATION: Allergies as of 11/22/2022   No Known Allergies      Medication List     TAKE these medications    alendronate 70 MG tablet Commonly known as: FOSAMAX Take 1 tablet (70 mg total) by mouth every Sunday.   ALPRAZolam 1 MG tablet Commonly known as: XANAX Take 1 mg by mouth 3 (three) times daily as needed for anxiety or sleep.   amLODipine 10 MG tablet Commonly known as: NORVASC Take 1 tablet (10 mg total) by mouth daily. Start taking on: November 23, 2022   amoxicillin-clavulanate 875-125 MG tablet Commonly known as: AUGMENTIN Take 1 tablet by mouth 3 (three) times daily for 6 days.   CALCIUM 1200 PO Take 1 tablet by mouth daily.   DULoxetine 60 MG capsule Commonly known as: CYMBALTA Take 60 mg by mouth at bedtime.   esomeprazole 40 MG capsule Commonly known as: NEXIUM Take 40 mg by mouth daily at 12 noon.   EYE DROPS OP Place 1 drop into both eyes 3 (three) times daily as needed (dry eyes).   Fish Oil 1000 MG Caps Take 1,000 mg by mouth daily.   fluticasone 50 MCG/ACT nasal spray Commonly known as: Flonase Place 1 spray into both nostrils daily. What changed:  when to take this reasons to take this   Fusion Plus Caps TAKE [place]  Magnesium 400 MG Tabs Take 1 tablet by mouth daily.   traZODone 100 MG tablet Commonly known as: DESYREL Take 100 mg by mouth at  bedtime.        Discharge Exam: BP (!) 168/83 (BP Location: Left Arm)   Pulse (!) 51   Temp 98 F (36.7 C) (Oral)   Resp 20   Ht 5' 2"$  (1.575 m)   Wt 52.6 kg   SpO2 100%   BMI 21.22 kg/m   General exam: Appears calm and comfortable Respiratory system: Clear to auscultation. Respiratory effort normal. Cardiovascular system: S1 & S2 heard, RRR. No murmurs, rubs, gallops or clicks. Gastrointestinal system: Abdomen is nondistended, soft and nontender. No organomegaly or masses felt. Normal bowel sounds heard. Central nervous system: Alert and oriented. No focal neurological deficits. Musculoskeletal: No edema. No calf tenderness Skin: No cyanosis. No rashes Psychiatry: Judgement and insight appear normal. Mood & affect appropriate.   Condition at discharge: stable  The results of significant diagnostics from this hospitalization (including imaging, microbiology, ancillary and laboratory) are listed below for reference.   Imaging Studies: CT ABDOMEN PELVIS W CONTRAST  Addendum Date: 11/21/2022   ADDENDUM REPORT: 11/21/2022 16:52 ADDENDUM: Addendum is made to correct communication information in the originally issued report. The incorrect call report macro text was inserted into the report, with auto population of the ordering physician Dr. Collene Mares and not the on-call physician that received communication provided by the radiology assistant. Report was called by the radiology assistant to Dr. Havery Moros on Friday 02/16/2023 at 5:55 p.m. Electronically Signed   By: Delanna Ahmadi M.D.   On: 11/21/2022 16:52   Result Date: 11/21/2022 CLINICAL DATA:  Periumbilical pain for 2-3 months EXAM: CT ABDOMEN AND PELVIS WITH CONTRAST TECHNIQUE: Multidetector CT imaging of the abdomen and pelvis was performed using the standard protocol following bolus administration of intravenous contrast. RADIATION DOSE REDUCTION: This exam was performed according to the departmental dose-optimization program which  includes automated exposure control, adjustment of the mA and/or kV according to patient size and/or use of iterative reconstruction technique. CONTRAST:  126m OMNIPAQUE IOHEXOL 300 MG/ML  SOLN COMPARISON:  04/07/2017 FINDINGS: Lower chest: No acute abnormality. Hepatobiliary: No solid liver abnormality is seen. No gallstones, gallbladder wall thickening, or biliary dilatation. Pancreas: Unremarkable. No pancreatic ductal dilatation or surrounding inflammatory changes. Spleen: Normal in size without significant abnormality. Adrenals/Urinary Tract: Adrenal glands are unremarkable. Simple, benign right renal cortical cyst, for which no further follow-up or characterization is required. Kidneys are otherwise normal, without renal calculi, solid lesion, or hydronephrosis. Bladder is unremarkable. Stomach/Bowel: Stomach is within normal limits. Normal appendix. Severe descending and sigmoid diverticulosis. Long segment wall thickening of the mid to distal sigmoid colon (series 2, image 50). Probable contained perforation arising from the superior aspect of the distal sigmoid measuring 3.5 x 2.2 x 2.2 cm (series 3, image 47, series 5, image 47). Vascular/Lymphatic: Aortic atherosclerosis. No enlarged abdominal or pelvic lymph nodes. Reproductive: Status post hysterectomy. Other: No abdominal wall hernia or abnormality. No ascites. Musculoskeletal: No acute or significant osseous findings. IMPRESSION: 1. Severe descending and sigmoid diverticulosis. Long segment wall thickening of the mid to distal sigmoid colon, consistent with diverticulitis, possibly both acute and chronic inflammation. 2. Probable contained perforation arising from the superior aspect of the distal sigmoid measuring 3.5 x 2.2 x 2.2 cm. 3. Status post hysterectomy. These results were called by telephone at the time of interpretation on 11/18/2022 at 5:36 pm to Dr. JJuanita Craver, who verbally  acknowledged these results. Aortic Atherosclerosis  (ICD10-I70.0). Electronically Signed: By: Delanna Ahmadi M.D. On: 11/18/2022 17:36    Microbiology: Results for orders placed or performed during the hospital encounter of 11/18/22  Blood Culture (routine x 2)     Status: None (Preliminary result)   Collection Time: 11/18/22 11:00 PM   Specimen: BLOOD RIGHT WRIST  Result Value Ref Range Status   Specimen Description   Final    BLOOD RIGHT WRIST Performed at [facility] [address] [place], [place] [address]    Special Requests   Final    BOTTLES DRAWN AEROBIC AND ANAEROBIC Blood Culture adequate volume Performed at [facility] [address] [place], [place] [address]    Culture   Final    NO GROWTH 3 DAYS Performed at [facility], [place] [address], [place], [place] [address]    Report Status PENDING  Incomplete  Blood Culture (routine x 2)     Status: None (Preliminary result)   Collection Time: 11/18/22 11:06 PM   Specimen: BLOOD RIGHT FOREARM  Result Value Ref Range Status   Specimen Description   Final    BLOOD RIGHT FOREARM Performed at [facility], [place] [address], [place], [place] [address]    Special Requests   Final    BOTTLES DRAWN AEROBIC AND ANAEROBIC Blood Culture adequate volume Performed at [facility] [address], [place], [place] [address]    Culture   Final    NO GROWTH 3 DAYS Performed at [facility], [place] [address] [place],  [address]    Report Status PENDING  Incomplete  Resp panel by RT-PCR (RSV, Flu A&B, Covid) Anterior Nasal Swab     Status: None   Collection Time: 11/18/22 11:37 PM   Specimen: Anterior Nasal Swab  Result Value Ref Range Status   SARS Coronavirus 2 by RT PCR NEGATIVE NEGATIVE Final    Comment: (NOTE) SARS-CoV-2 target nucleic acids are NOT DETECTED.  The SARS-CoV-2 RNA is generally detectable in upper respiratory specimens during the acute phase of infection. The lowest concentration of SARS-CoV-2  viral copies this assay can detect is 138 copies/mL. A negative result does not preclude SARS-Cov-2 infection and should not be used as the sole basis for treatment or other patient management decisions. A negative result may occur with  improper specimen collection/handling, submission of specimen other than nasopharyngeal swab, presence of viral mutation(s) within the areas targeted by this assay, and inadequate number of viral copies(<138 copies/mL). A negative result must be combined with clinical observations, patient history, and epidemiological information. The expected result is Negative.  Fact Sheet for Patients:  EntrepreneurPulse.com.au  Fact Sheet for Healthcare Providers:  IncredibleEmployment.be  This test is no t yet approved or cleared by the [place] FDA and  has been authorized for detection and/or diagnosis of SARS-CoV-2 by FDA under an Emergency Use Authorization (EUA). This EUA will remain  in effect (meaning this test can be used) for the duration of the COVID-19 declaration under Section 564(b)(1) of the Act, 21 U.S.C.section 360bbb-3(b)(1), unless the authorization is terminated  or revoked sooner.       Influenza A by PCR NEGATIVE NEGATIVE Final   Influenza B by PCR NEGATIVE NEGATIVE Final    Comment: (NOTE) The Xpert Xpress SARS-CoV-2/FLU/RSV plus assay is intended as an aid in the diagnosis of influenza from Nasopharyngeal swab specimens and should not be used as a sole basis  for treatment. Nasal washings and aspirates are unacceptable for Xpert Xpress SARS-CoV-2/FLU/RSV testing.  Fact Sheet for Patients: EntrepreneurPulse.com.au  Fact Sheet for Healthcare Providers: IncredibleEmployment.be  This test is not yet approved or cleared by the [place] FDA and has been authorized for detection and/or diagnosis of SARS-CoV-2 by FDA under an Emergency Use Authorization  (EUA). This EUA will remain in effect (meaning this test can be used) for the duration of the COVID-19 declaration under Section 564(b)(1) of the Act, 21 U.S.C. section 360bbb-3(b)(1), unless the authorization is terminated or revoked.     Resp Syncytial Virus by PCR NEGATIVE NEGATIVE Final    Comment: (NOTE) Fact Sheet for Patients: EntrepreneurPulse.com.au  Fact Sheet for Healthcare Providers: IncredibleEmployment.be  This test is not yet approved or cleared by the [place] FDA and has been authorized for detection and/or diagnosis of SARS-CoV-2 by FDA under an Emergency Use Authorization (EUA). This EUA will remain in effect (meaning this test can be used) for the duration of the COVID-19 declaration under Section 564(b)(1) of the Act, 21 U.S.C. section 360bbb-3(b)(1), unless the authorization is terminated or revoked.  Performed at [facility], [address] [place], [place] [address]     Labs: CBC: Recent Labs  Lab 11/18/22 2300 11/19/22 0607  WBC 7.2 5.2  NEUTROABS 4.0 2.3  HGB 13.1 12.6  HCT 38.0 36.9  MCV 94.3 95.1  PLT 380 [id number]   Basic Metabolic Panel: Recent Labs  Lab 11/18/22 2300 11/19/22 0607 11/19/22 1255 11/19/22 1905 11/20/22 1101 11/21/22 1009  NA 125* 135 135 127* 136 133*  K 3.6 3.7 3.9 3.7  --  3.9  CL 93* 100 99 94*  --  101  CO2 21* 25 24 22  $ --  25  GLUCOSE 86 88 90 90  --  144*  BUN 5* <5* <5* 5*  --  <5*  CREATININE 0.53 0.65 0.60 0.77  --  0.78  CALCIUM 8.7* 8.8* 8.9 8.4*  --  8.6*   Liver Function Tests: Recent Labs  Lab 11/18/22 2300 11/19/22 0607  AST 13* 18  ALT 13 12  ALKPHOS 54 47  BILITOT 0.2* 0.4  PROT 6.8 6.0*  ALBUMIN 3.5 3.3*   CBG: Recent Labs  Lab 11/20/22 2325 11/21/22 0523 11/21/22 0750 11/21/22 1234 11/22/22 0020  GLUCAP 123* 136* 120* 159* 128*    Discharge time spent: 35 minutes.  Signed: Cordelia Poche, MD [facility] 11/22/2022

## 2022-11-22 NOTE — Progress Notes (Signed)
Staff message sent to arrange 30 day heart monitor and 7-8 week post hospital follow up to review monitor result.

## 2022-11-22 NOTE — TOC Transition Note (Signed)
Transition of Care [facility]) - CM/SW Discharge Note   Patient Details  Name: Maureen Rodgers MRN: [id number] Date of Birth: 1950-11-12  [facility] CM/SW Contact:  Dessa Phi, RN Phone Number: 11/22/2022, 12:52 PM   Clinical Narrative: SA resources added to AVS. No further CM needs.      Final next level of care: Home/Self Care Barriers to Discharge: No Barriers Identified   Patient Goals and CMS Choice      Discharge Placement                         Discharge Plan and Services Additional resources added to the After Visit Summary for                                       Social Determinants of Health (SDOH) Interventions SDOH Screenings   Food Insecurity: No Food Insecurity (11/19/2022)  Housing: Low Risk  (11/19/2022)  Transportation Needs: No Transportation Needs (11/19/2022)  Utilities: Not At Risk (11/19/2022)  Alcohol Screen: Medium Risk (08/30/2021)  Depression (PHQ2-9): Medium Risk (09/12/2022)  Financial Resource Strain: Low Risk  (03/24/2022)  Physical Activity: Insufficiently Active (03/24/2022)  Stress: Stress Concern Present (03/24/2022)  Tobacco Use: High Risk (11/19/2022)     Readmission Risk Interventions     No data to display

## 2022-11-22 NOTE — Progress Notes (Signed)
Subjective: CC: Doing well. LLQ abdominal pain has nearly resolved. Tolerating soft diet without n/v or worsening abdominal pain. Passing flatus. Small bm this am.   Objective: Vital signs in last 24 hours: Temp:  [98 F (36.7 C)-98.8 F (37.1 C)] 98 F (36.7 C) (02/20 0531) Pulse Rate:  [51-56] 51 (02/20 0531) Resp:  [18-20] 20 (02/20 0531) BP: (138-168)/(79-90) 168/83 (02/20 0531) SpO2:  [100 %] 100 % (02/20 0531) Last BM Date : 11/22/22  Intake/Output from previous day: 02/19 0701 - 02/20 0700 In: 5315.8 [P.O.:960; I.V.:4055; IV Piggyback:300.9] Out: 5400 [Urine:5400] Intake/Output this shift: Total I/O In: 480 [P.O.:480] Out: -   PE: Gen:  Alert, NAD, pleasant Abd: Soft, ND, very mild llq ttp without rigidity or guarding, +BS  Lab Results:  No results for input(s): "WBC", "HGB", "HCT", "PLT" in the last 72 hours.  BMET Recent Labs    11/19/22 1905 11/20/22 1101 11/21/22 1009  NA 127* 136 133*  K 3.7  --  3.9  CL 94*  --  101  CO2 22  --  25  GLUCOSE 90  --  144*  BUN 5*  --  <5*  CREATININE 0.77  --  0.78  CALCIUM 8.4*  --  8.6*    PT/INR No results for input(s): "LABPROT", "INR" in the last 72 hours.  CMP     Component Value Date/Time   NA 133 (L) 11/21/2022 1009   NA 138 09/12/2022 1301   K 3.9 11/21/2022 1009   CL 101 11/21/2022 1009   CO2 25 11/21/2022 1009   GLUCOSE 144 (H) 11/21/2022 1009   BUN <5 (L) 11/21/2022 1009   BUN 4 (L) 09/12/2022 1301   CREATININE 0.78 11/21/2022 1009   CALCIUM 8.6 (L) 11/21/2022 1009   PROT 6.0 (L) 11/19/2022 0607   PROT 6.8 09/12/2022 1301   ALBUMIN 3.3 (L) 11/19/2022 0607   ALBUMIN 4.5 09/12/2022 1301   AST 18 11/19/2022 0607   ALT 12 11/19/2022 0607   ALKPHOS 47 11/19/2022 0607   BILITOT 0.4 11/19/2022 0607   BILITOT <0.2 09/12/2022 1301   GFRNONAA >60 11/21/2022 1009   GFRAA 90 07/27/2020 1513   Lipase     Component Value Date/Time   LIPASE 17 10/23/2015 1250    Studies/Results: No  results found.  Anti-infectives: Anti-infectives (From admission, onward)    Start     Dose/Rate Route Frequency Ordered Stop   11/19/22 0800  piperacillin-tazobactam (ZOSYN) IVPB 3.375 g        3.375 g 12.5 mL/hr over 240 Minutes Intravenous Every 8 hours 11/19/22 0526     11/18/22 2245  ceFEPIme (MAXIPIME) 2 g in sodium chloride 0.9 % 100 mL IVPB        2 g 200 mL/hr over 30 Minutes Intravenous  Once 11/18/22 2230 11/18/22 2339   11/18/22 2245  metroNIDAZOLE (FLAGYL) IVPB 500 mg        500 mg 100 mL/hr over 60 Minutes Intravenous  Once 11/18/22 2230 11/19/22 0050        Assessment/Plan Contained perforated sigmoid diverticulitis - Patient doing well. Abdominal pain improved, tolerating diet, minimally ttp in LLQ, wbc normalized 2/17. Okay for d/c later today on total of 10-14d of abx since admission. Will reach out to [facility] to let them know. Discussed case with attending. No need for repeat CT scan as outpatient unless patient has clinical change. She also does not need direct f/u with us/CRS but can be referred by  Dr. Hoyt Koch prn. Discussed discharge instructions, restrictions and return/call back precautions with patient. We will sign off. Please call back if there are any changes or questions.   FEN - Soft diet. IVF per TRH VTE - SCDs, okay for chem ppx from a general surgery standpoint ID - Zosyn   I reviewed nursing notes, hospitalist notes, last 24 h vitals and pain scores, last 48 h intake and output, last 24 h labs and trends, and last 24 h imaging results.    LOS: 3 days    Jillyn Ledger , [facility] 11/22/2022, 9:20 AM Please see Amion for pager number during day hours 7:00am-4:30pm

## 2022-11-22 NOTE — Discharge Instructions (Addendum)
Maureen Rodgers,  You were in the hospital with diverticulitis. You have improved with antibiotics. You were also found to have hypertension and have been started on blood pressure medication. Lastly, you were found to have a slow heart rate but thankfully this does not appear to be causing you issues, nor is it dangerous at this time. The cardiologist would like to see you in their office. Please also follow-up with your PCP.

## 2022-11-22 NOTE — Progress Notes (Signed)
Pt discharged home today per Dr. Lonny Prude. Pt's IV sites D/C'd and WDL. Pt's VSS. Pt provided with home medication list, discharge instructions and prescriptions. Verbalized understanding. Pt left floor via WC in stable condition accompanied by NT.

## 2022-11-23 ENCOUNTER — Telehealth: Payer: Self-pay

## 2022-11-23 NOTE — Transitions of Care (Post Inpatient/ED Visit) (Signed)
   11/23/2022  Name: Maureen Rodgers MRN: [id number] DOB: 1951/05/26  Today's TOC FU Call Status: Today's TOC FU Call Status:: Successful TOC FU Call Competed TOC FU Call Complete Date: 11/23/22  Transition Care Management Follow-up Telephone Call Date of Discharge: 11/22/22 Discharge Facility: [facility]) Type of Discharge: Inpatient Admission Primary Inpatient Discharge Diagnosis:: "perforation of sigmoid colon due to diverticulitis" How have you been since you were released from the hospital?: Better Any questions or concerns?: Yes Patient Questions/Concerns:: patient expresses she has a lot going on right now with ehr GI issues andnow new heart issues-could use support-agreeabble to  being assigned to [facility] RNCM for ongoing support Patient Questions/Concerns Addressed: Other: (appt scheduled with RN CM) Patient voices she is to being delivered a heart monitor to wear at home for a few days and has to follow up with cardiologist in 2wks-she will call office and make an appt Items Reviewed: Did you receive and understand the discharge instructions provided?: Yes Medications obtained and verified?: Yes (Medications Reviewed) Any new allergies since your discharge?: No Dietary orders reviewed?: Yes Type of Diet Ordered:: cardiac Do you have support at home?: No  Home Care and Equipment/Supplies: [place] Ordered?: No Any new equipment or medical supplies ordered?: No  Functional Questionnaire: Do you need assistance with bathing/showering or dressing?: No Do you need assistance with meal preparation?: No Do you need assistance with eating?: No Do you have difficulty maintaining continence: No Do you need assistance with getting out of bed/getting out of a chair/moving?: No Do you have difficulty managing or taking your medications?: No  Folllow up appointments reviewed: PCP Follow-up appointment confirmed?: No (attempted to make appt while on call with  patient using care guide-patient will follow up with office later) MD Provider Line Number:[phone] Given: No Specialist Hospital Follow-up appointment confirmed?: Yes Date of Specialist follow-up appointment?: 12/06/22 Follow-Up Specialty Provider:: Dr. Collene Mares Do you need transportation to your follow-up appointment?: No Do you understand care options if your condition(s) worsen?: Yes-patient verbalized understanding  SDOH Interventions Today    Flowsheet Row Most Recent Value  SDOH Interventions   Food Insecurity Interventions Intervention Not Indicated  Transportation Interventions Intervention Not Indicated      TOC Interventions Today    Flowsheet Row Most Recent Value  TOC Interventions   TOC Interventions Discussed/Reviewed TOC Interventions Discussed  [attempted to assist pt with scheduling MD follow up appt with use of care guides-no openings available to fit pt schedule-she will  followq up with office]      Interventions Today    Flowsheet Row Most Recent Value  General Interventions   General Interventions Discussed/Reviewed General Interventions Discussed  [BP mgmt-requested BP be mailed to patient]  Education Interventions   Education Provided Provided Education  [BP mgmt, GI mgmt]  Provided Verbal Education On Nutrition, When to see the doctor  Nutrition Interventions   Nutrition Discussed/Reviewed Nutrition Discussed, Fluid intake, Decreasing salt  Pharmacy Interventions   Pharmacy Dicussed/Reviewed Pharmacy Topics Discussed, Medications and their functions  Safety Interventions   Safety Discussed/Reviewed Safety Discussed       Hetty Blend [facility] Care Management Care Management Community Coordinator Direct Phone: [phone] Toll Free: [phone] Fax: [phone]

## 2022-11-24 ENCOUNTER — Telehealth: Payer: Self-pay

## 2022-11-24 LAB — CULTURE, BLOOD (ROUTINE X 2)
Culture: NO GROWTH
Culture: NO GROWTH
Special Requests: ADEQUATE
Special Requests: ADEQUATE

## 2022-11-24 NOTE — Transitions of Care (Post Inpatient/ED Visit) (Signed)
   11/24/2022  Name: EMMARI PRUSKI MRN: [id number] DOB: 09/13/1951  Today's TOC FU Call Status: TOC FU Call Complete Date: 11/24/22  Transition Care Management Follow-up Telephone Call Date of Discharge: 11/22/22 Discharge Facility: [facility]) Type of Discharge: Inpatient Admission  Follow Up Call    [facility] call completed on 11/23/22. Spoke with patient today to verify delivery of BP machine that RN CM had ordered for patient delivered to her home. She confirms that device was delivered today via [employer]. She has not had a chance yet to try it out. She will do so later. Advised that if she had issues with getting machine to work to take to MD appt to see if staff could show her. She voiced understanding. No further RN CM needs or concerns at this time.   Interventions Today    Flowsheet Row Most Recent Value  Education Interventions   Education Provided Provided Education  [BP mgmt]        [facility] Care Management Care Management Community Coordinator Direct Phone: [phone] Toll Free: [phone] Fax: [phone]

## 2022-11-26 ENCOUNTER — Ambulatory Visit: Payer: Medicare Other | Attending: Physician Assistant

## 2022-11-26 DIAGNOSIS — R001 Bradycardia, unspecified: Secondary | ICD-10-CM | POA: Diagnosis not present

## 2022-11-28 ENCOUNTER — Ambulatory Visit: Payer: Self-pay

## 2022-11-28 NOTE — Patient Instructions (Signed)
Visit Information  Thank you for taking time to visit with me today. Please don't hesitate to contact me if I can be of assistance to you.   Following are the goals we discussed today:   Goals Addressed             This Visit's Progress    To overcome acute fatigue and return to baseline       Care Coordination Interventions: Evaluation of current treatment plan related to fatigue and patient's adherence to plan as established by provider Determined patient experienced a recent hospitalization for Diverticulitis of colon with perforation  Review of patient status, including review of consultant's reports, relevant laboratory and other test results, and medications completed Discussed with patient her chief complaint is related to having excessive fatigue Nutritional assessment performed; educated patient regarding foods to eat to obtain more protein without increasing her risk for reoccurring diverticulitis Reviewed and discussed patient is self monitoring her BP at home, reviewed readings, noted patient is wearing a 30 day heart monitor due to experiencing bradycardia while IP Educated patient regarding the importance of staying well hydrated with plenty of water, aiming for 48-64 oz of water daily unless otherwise directed Instructed patient to balance her activity with rest and to discuss her persistent fatigue and vomiting episode with Dr. Baird Cancer at next scheduled visit set for tomorrow Reviewed scheduled/upcoming provider appointments including: next PCP follow up appointment with Dr. Baird Cancer scheduled for 11/29/22 '@11'$ :40 AM; Reviewed upcoming follow up with Cardiology and Gastroenterologist            Our next appointment is by telephone on 12/12/22 at 2:00 PM  Please call the care guide team at [phone] if you need to cancel or reschedule your appointment.   If you are experiencing a Mental Health or [place] or need someone to talk to, please call  1-800-273-TALK (toll free, 24 hour hotline) go to [facility] [address], [place] [phone])  Patient verbalizes understanding of instructions and care plan provided today and agrees to view in [place]. Active MyChart status and patient understanding of how to access instructions and care plan via MyChart confirmed with patient.     Barb Merino, RN, BSN, CCM Care Management Coordinator [facility] Care Management  Direct Phone: [phone]

## 2022-11-28 NOTE — Patient Outreach (Addendum)
  Care Coordination   Initial Visit Note   11/28/2022 Name: SABRIYAH JUDE MRN: [id number] DOB: 1951-05-02  AKEILAH DILLAHUNT is a 72 y.o. year old female who sees Glendale Chard, MD for primary care. I spoke with  Tula Nakayama by phone today.  What matters to the patients health and wellness today?  Patient will discuss her persistent fatigue with her PCP during tomorrow's visit.     Goals Addressed             This Visit's Progress    To overcome acute fatigue and return to baseline       Care Coordination Interventions: Evaluation of current treatment plan related to fatigue and patient's adherence to plan as established by provider Determined patient experienced a recent hospitalization for Diverticulitis of colon with perforation  Review of patient status, including review of consultant's reports, relevant laboratory and other test results, and medications completed Discussed with patient her chief complaint is related to having excessive fatigue Nutritional assessment performed; educated patient regarding foods to eat to obtain more protein without increasing her risk for reoccurring diverticulitis Reviewed and discussed patient is self monitoring her BP at home, reviewed readings, noted patient is wearing a 30 day heart monitor due to experiencing bradycardia while IP Educated patient regarding the importance of staying well hydrated with plenty of water, aiming for 48-64 oz of water daily unless otherwise directed Instructed patient to balance her activity with rest and to discuss her persistent fatigue and vomiting episode with Dr. Baird Cancer at next scheduled visit set for tomorrow Reviewed scheduled/upcoming provider appointments including: next PCP follow up appointment with Dr. Baird Cancer scheduled for 11/29/22 @11$ :40 AM; Reviewed upcoming follow up with Cardiology and Gastroenterologist        Interventions Today    Flowsheet Row Most Recent Value  Chronic Disease    Chronic disease during today's visit Other  [Diverticulitis, fatigue]  General Interventions   General Interventions Discussed/Reviewed General Interventions Discussed, General Interventions Reviewed, Doctor Visits  Doctor Visits Discussed/Reviewed Doctor Visits Discussed, Doctor Visits Reviewed  Exercise Interventions   Exercise Discussed/Reviewed Physical Activity  Physical Activity Discussed/Reviewed Physical Activity Discussed  Education Interventions   Provided Verbal Education On Nutrition  Nutrition Interventions   Nutrition Discussed/Reviewed Nutrition Discussed, Nutrition Reviewed, Increaing proteins, Fluid intake  Pharmacy Interventions   Pharmacy Dicussed/Reviewed Pharmacy Topics Discussed          SDOH assessments and interventions completed:  Yes  SDOH Interventions Today    Flowsheet Row Most Recent Value  SDOH Interventions   Transportation Interventions Intervention Not Indicated        Care Coordination Interventions:  Yes, provided   Follow up plan: Follow up call scheduled for 12/12/22 @2$ :00 PM     Encounter Outcome:  Pt. Visit Completed

## 2022-11-29 ENCOUNTER — Encounter: Payer: Self-pay | Admitting: Internal Medicine

## 2022-11-29 ENCOUNTER — Ambulatory Visit (INDEPENDENT_AMBULATORY_CARE_PROVIDER_SITE_OTHER): Payer: Medicare Other | Admitting: Internal Medicine

## 2022-11-29 VITALS — BP 106/82 | HR 93 | Temp 98.2°F | Ht 62.0 in | Wt 113.2 lb

## 2022-11-29 DIAGNOSIS — R001 Bradycardia, unspecified: Secondary | ICD-10-CM

## 2022-11-29 DIAGNOSIS — K5720 Diverticulitis of large intestine with perforation and abscess without bleeding: Secondary | ICD-10-CM

## 2022-11-29 DIAGNOSIS — R7309 Other abnormal glucose: Secondary | ICD-10-CM

## 2022-11-29 DIAGNOSIS — R7989 Other specified abnormal findings of blood chemistry: Secondary | ICD-10-CM | POA: Diagnosis not present

## 2022-11-29 DIAGNOSIS — F1020 Alcohol dependence, uncomplicated: Secondary | ICD-10-CM

## 2022-11-29 DIAGNOSIS — E871 Hypo-osmolality and hyponatremia: Secondary | ICD-10-CM

## 2022-11-29 DIAGNOSIS — R413 Other amnesia: Secondary | ICD-10-CM

## 2022-11-29 DIAGNOSIS — I7121 Aneurysm of the ascending aorta, without rupture: Secondary | ICD-10-CM

## 2022-11-29 DIAGNOSIS — I119 Hypertensive heart disease without heart failure: Secondary | ICD-10-CM

## 2022-11-29 DIAGNOSIS — I10 Essential (primary) hypertension: Secondary | ICD-10-CM

## 2022-11-29 DIAGNOSIS — I2510 Atherosclerotic heart disease of native coronary artery without angina pectoris: Secondary | ICD-10-CM

## 2022-11-29 DIAGNOSIS — F17210 Nicotine dependence, cigarettes, uncomplicated: Secondary | ICD-10-CM

## 2022-11-29 DIAGNOSIS — I700 Atherosclerosis of aorta: Secondary | ICD-10-CM | POA: Diagnosis not present

## 2022-11-29 DIAGNOSIS — F331 Major depressive disorder, recurrent, moderate: Secondary | ICD-10-CM

## 2022-11-29 NOTE — Patient Instructions (Signed)
Diverticulitis  Diverticulitis is when small pouches in your colon get infected or swollen. This causes pain in your belly (abdomen) and watery poop (diarrhea). The small pouches are called diverticula. They may form if you have a condition called diverticulosis. What are the causes? You may get this condition if poop (stool) gets trapped in the pouches in your colon. The poop lets germs (bacteria) grow. This causes an infection. What increases the risk? You are more likely to get this condition if you have small pouches in your colon. You are also more likely to get it if: You are overweight or very overweight (obese). You do not exercise enough. You drink alcohol. You smoke. You eat a lot of red meat, like beef, pork, or lamb. You do not eat enough fiber. You are older than 72 years of age. What are the signs or symptoms? Pain in your belly. Pain is often on the left side, but it may be felt in other spots too. Fever and chills. Feeling like you may vomit. Vomiting. Having cramps. Feeling full. Changes in how often you poop. Blood in your poop. How is this treated? Most cases are treated at home. You may be told to: Take over-the-counter pain medicines. Only eat and drink clear liquids. Take antibiotics. Rest. Very bad cases may need to be treated at a hospital. Treatment may include: Not eating or drinking. Taking pain medicines. Getting antibiotics through an IV tube. Getting fluid and food through an IV tube. Having surgery. When you are feeling better, you may need to have a test to look at your colon (colonoscopy). Follow these instructions at home: Medicines Take over-the-counter and prescription medicines only as told by your doctor. These include: Fiber pills. Probiotics. Medicines to make your poop soft (stool softeners). If you were prescribed antibiotics, take them as told by your doctor. Do not stop taking them even if you start to feel better. Ask your  doctor if you should avoid driving or using machines while you are taking your medicine. Eating and drinking  Follow the diet told by your doctor. You may need to only eat and drink liquids. When you feel better, you may be able to eat more foods. You may also be told to eat a lot of fiber. Fiber helps you poop. Foods with fiber include berries, beans, lentils, and green vegetables. Try not to eat red meat. General instructions Do not smoke or use any products that contain nicotine or tobacco. If you need help quitting, ask your doctor. Exercise 3 or more times a week. Try to go for 30 minutes each time. Exercise enough to sweat and make your heart beat faster. Contact a doctor if: Your pain gets worse. You are not pooping like normal. Your symptoms do not get better. Your symptoms get worse very fast. You have a fever. You vomit more than one time. You have poop that is: Bloody. Black. Tarry. This information is not intended to replace advice given to you by your health care provider. Make sure you discuss any questions you have with your health care provider. Document Revised: 06/16/2022 Document Reviewed: 06/16/2022 Elsevier Patient Education  [place].

## 2022-11-29 NOTE — Progress Notes (Signed)
I,Maureen Rodgers,acting as a scribe for Maureen Greenland, MD.,have documented all relevant documentation on the behalf of Maureen Greenland, MD,as directed by  Maureen Greenland, MD while in the presence of Maureen Greenland, MD.    Subjective:     Patient ID: Maureen Rodgers , female    DOB: 30-Mar-1951 , 72 y.o.   MRN: [id number]   Chief Complaint  Patient presents with   Hospitalization Follow-up    HPI  Pt presents today for HPFU. She was admitted on 2/16. Discharged on 2/20. She presented to GI w/ c/o abdominal pain. CT scan was ordered which revealed sigmoid diverticulitis w/ perforation. She was advised to immediately go to ER. General surgery was consulted, treatment included iv abx as part of conservative management. She was started empirically on Cefepime/Flagyl in the ED and transitioned to Zosyn on admission. Blood cultures obtained. General surgery consulted and recommended conservative management. Diet advanced successfully. Patient discharged on Augmentin to complete a 10 day course of antibiotics.  She was found to be bradycardic w/o symptoms. Cardiology was consulted and she is scheduled for outpatient f/u.  She was started on CIWA protocol due to alcohol use.   She was discharged in stable condition on 11/22/22. She is still with decreased appetite, but admits she does feel better than she did at presentation.  Today, she reports she is feeling much better, she feels a lot more energized. She denies having specific questions or concerns.  She adds, a Marine scientist from [employer] calls her periodically.      Past Medical History:  Diagnosis Date   Ascending aortic aneurysm ([place]) 11/29/2021   Depression    Essential hypertension 11/29/2021   GERD (gastroesophageal reflux disease)    Tobacco use      Family History  Problem Relation Age of Onset   Heart disease Mother    Dementia Mother    Alcohol abuse Mother    Alcohol abuse Father    Heart disease Brother    Diabetes  Brother    Heart attack Maternal Grandmother      Current Outpatient Medications:    alendronate (FOSAMAX) 70 MG tablet, Take 1 tablet (70 mg total) by mouth every Sunday., Disp: 15 tablet, Rfl: 1   ALPRAZolam (XANAX) 1 MG tablet, Take 1 mg by mouth 3 (three) times daily as needed for anxiety or sleep., Disp: , Rfl:    amLODipine (NORVASC) 10 MG tablet, Take 1 tablet (10 mg total) by mouth daily., Disp: 30 tablet, Rfl: 2   Calcium Carbonate-Vit D-Min (CALCIUM 1200 PO), Take 1 tablet by mouth daily., Disp: , Rfl:    Carboxymethylcellulose Sodium (EYE DROPS OP), Place 1 drop into both eyes 3 (three) times daily as needed (dry eyes)., Disp: , Rfl:    DULoxetine (CYMBALTA) 60 MG capsule, Take 60 mg by mouth at bedtime., Disp: , Rfl:    esomeprazole (NEXIUM) 40 MG capsule, Take 40 mg by mouth daily at 12 noon., Disp: , Rfl:    fluticasone (FLONASE) 50 MCG/ACT nasal spray, Place 1 spray into both nostrils daily. (Patient taking differently: Place 1 spray into both nostrils daily as needed for allergies.), Disp: 16 g, Rfl: 5   Iron-FA-B Cmp-C-Biot-Probiotic (FUSION PLUS) CAPS, TAKE 1 CAPSULE BY MOUTH MONDAY THROUGH FRIDAYS, Disp: 30 capsule, Rfl: 3   Magnesium 400 MG TABS, Take 1 tablet by mouth daily., Disp: , Rfl:    Omega-3 Fatty Acids (FISH OIL) 1000 MG CAPS, Take 1,000 mg by  mouth daily. , Disp: , Rfl:    traZODone (DESYREL) 100 MG tablet, Take 100 mg by mouth at bedtime., Disp: , Rfl:    No Known Allergies   Review of Systems  Constitutional: Negative.   Respiratory: Negative.    Cardiovascular: Negative.   Gastrointestinal: Negative.   Musculoskeletal: Negative.   Skin: Negative.   Neurological: Negative.        She c/o memory issues. She is not sure what could be contributing to her sx. She does not recall a lot of what happened at the hospital  Psychiatric/Behavioral: Negative.       Today's Vitals   11/29/22 1136  BP: 106/82  Pulse: 93  Temp: 98.2 F (36.8 C)  SpO2: 98%   Weight: 113 lb 3.2 oz (51.3 kg)  Height: 5\' 2"  (1.575 m)   Body mass index is 20.7 kg/m.  Wt Readings from Last 3 Encounters:  12/05/22 113 lb 4.8 oz (51.4 kg)  11/29/22 113 lb 3.2 oz (51.3 kg)  11/19/22 116 lb (52.6 kg)    Objective:  Physical Exam Vitals and nursing note reviewed.  Constitutional:      Appearance: Normal appearance.  HENT:     Head: Normocephalic and atraumatic.     Nose:     Comments: MASKED     Mouth/Throat:     Comments: MASKED Eyes:     Extraocular Movements: Extraocular movements intact.  Cardiovascular:     Rate and Rhythm: Normal rate and regular rhythm.     Heart sounds: Normal heart sounds.  Pulmonary:     Effort: Pulmonary effort is normal.     Breath sounds: Normal breath sounds.  Musculoskeletal:     Cervical back: Normal range of motion.  Skin:    General: Skin is warm.  Neurological:     General: No focal deficit present.     Mental Status: She is alert.  Psychiatric:        Mood and Affect: Mood normal.        Behavior: Behavior normal.         Assessment And Plan:     1. Diverticulitis of colon with perforation Comments: She was sent to ER by GI once CT results revealed sigmoid diverticulitis w/ perforation. She was treated w/ iv abx and she was discharged on oral Augmentin. TCM PERFORMED. A MEMBER OF THE CLINICAL TEAM SPOKE WITH THE PATIENT UPON DISCHARGE. DISCHARGE SUMMARY WAS REVIEWED IN FULL DETAIL DURING THE VISIT. MEDS RECONCILED AND COMPARED TO DISCHARGE MEDS. MEDICATION LIST WAS UPDATED AND REVIEWED WITH THE PATIENT. GREATER THAN 50% FACE TO FACE TIME WAS SPENT IN COUNSELING AND COORDINATION OF CARE. ALL QUESTIONS WERE ANSWERED TO THE SATISFACTION OF THE PATIENT.   2. Hypertensive heart disease without heart failure Comments: Due to underlying aortic aneurysm, importance of improved BP control was d/w patient. Goal BP<120/80. She will c/w amlodipine 10mg  daily. - BMP8+EGFR  3. Aneurysm of ascending aorta without rupture  (HCC) Comments: Importance of optimal BP control was d/w patient.  4. Coronary artery disease involving native coronary artery of native heart without angina pectoris Comments: Chronic, not yet on statin therapy.  She is encouraged to decrease fried food intake, gradually increase exercise and fiber intake. Unable to start BBlocker due  5. Atherosclerosis of aorta (HCC) Comments: Chronic, LDL goal < 70. She would likely benefit from statin therapy. She will discuss further w/ Cardiology.  6. Hyponatremia Comments: Sodium level was 125 upon admission.  Thought to be due  to dehydration, improved with iv fluids. I will recheck BMP today. - BMP8+EGFR  7. Bradycardia Comments: This was noted in hospital, not present today. Today's HR is 93. I will check TSH today. She has upcoming Cardiology appt on 3/4.  8. Low serum total protein level Comments: We discussed the importance of improved nutrition for both gut and brain health. We discussed higher protein choices for her to include in her diet. - Prealbumin  9. Abnormal glucose Comments: Previous labs reviewed, a1c has been elevated in the past. I will recheck this today. She is encouraged to limit her intake of sugary beverages/foods. - Hemoglobin A1c  10. Moderate episode of recurrent major depressive disorder ([place]) Comments: She will c/w duloxetine and trazodone daily.  11. Cigarette nicotine dependence without complication Comments: Importance of smoking cessation was d/w patient. She is encouraged to decrease number of cigs smoked/day.  12. Alcohol use disorder, moderate, dependence ([place]) Comments: She does not wish to go to ADS at this time. She should take thiamine daily.  13. Memory changes Comments: Possibly related to inadequate nutrition.   Patient was given opportunity to ask questions. Patient verbalized understanding of the plan and was able to repeat key elements of the plan. All questions were answered to their  satisfaction.   I, Maureen Greenland, MD, have reviewed all documentation for this visit. The documentation on 11/29/22 for the exam, diagnosis, procedures, and orders are all accurate and complete.   IF YOU HAVE BEEN REFERRED TO A SPECIALIST, IT MAY TAKE 1-2 WEEKS TO SCHEDULE/PROCESS THE REFERRAL. IF YOU HAVE NOT HEARD FROM US/SPECIALIST IN TWO WEEKS, PLEASE GIVE [place] A CALL AT [phone].   THE PATIENT IS ENCOURAGED TO PRACTICE SOCIAL DISTANCING DUE TO THE COVID-19 PANDEMIC.

## 2022-11-30 LAB — PREALBUMIN: PREALBUMIN: 16 mg/dL (ref 9–32)

## 2022-11-30 LAB — BMP8+EGFR
BUN/Creatinine Ratio: 6 — ABNORMAL LOW (ref 12–28)
BUN: 4 mg/dL — ABNORMAL LOW (ref 8–27)
CO2: 24 mmol/L (ref 20–29)
Calcium: 10 mg/dL (ref 8.7–10.3)
Chloride: 98 mmol/L (ref 96–106)
Creatinine, Ser: 0.69 mg/dL (ref 0.57–1.00)
Glucose: 95 mg/dL (ref 70–99)
Potassium: 4.4 mmol/L (ref 3.5–5.2)
Sodium: 137 mmol/L (ref 134–144)
eGFR: 93 mL/min/{1.73_m2} (ref >59)

## 2022-11-30 LAB — HEMOGLOBIN A1C
Est. average glucose Bld gHb Est-mCnc: 111 mg/dL
Hgb A1c MFr Bld: 5.5 % (ref 4.8–5.6)

## 2022-12-05 ENCOUNTER — Encounter (HOSPITAL_BASED_OUTPATIENT_CLINIC_OR_DEPARTMENT_OTHER): Payer: Self-pay | Admitting: Cardiovascular Disease

## 2022-12-05 ENCOUNTER — Ambulatory Visit (INDEPENDENT_AMBULATORY_CARE_PROVIDER_SITE_OTHER): Payer: Medicare Other | Admitting: Cardiovascular Disease

## 2022-12-05 VITALS — BP 124/76 | HR 84 | Ht 62.0 in | Wt 113.3 lb

## 2022-12-05 DIAGNOSIS — I10 Essential (primary) hypertension: Secondary | ICD-10-CM

## 2022-12-05 DIAGNOSIS — I700 Atherosclerosis of aorta: Secondary | ICD-10-CM | POA: Diagnosis not present

## 2022-12-05 NOTE — Assessment & Plan Note (Signed)
Blood pressure is now very well-controlled on amlodipine.  Recommended that she work on increasing her exercise as tolerated.

## 2022-12-05 NOTE — Progress Notes (Signed)
Cardiology Office Note  Date:  12/05/2022   ID:  [name], [name] [birth year], MRN [id number]  PCP:  Glendale Chard, MD  Cardiologist:   Skeet Latch, MD   No chief complaint on file.  History of Present Illness: Maureen Rodgers is a 71 y.o. female with aortic atherosclerosis, iron deficiency anemia, GERD, hyperlipidemia, and tobacco abuse who is being seen today for follow-up. She was initially seen 11/2021 for the evaluation of shortness of breath at the request of Glendale Chard, MD. She saw Dr. Baird Cancer 08/2021 and reported shortness of breath.  It was noted that anemia may have been contributing.  She had an echo 12/22 that revealed LVEF 60 to 65% with mild LVH and grade 1 diastolic dysfunction.  She had mild posterior mitral valve leaflet prolapse with no regurgitation.  She had been struggling with depression.  She was in therapy but felt constantly tired.  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  She reported shortness of breath with exertion.  She smoked at least 0.5 ppd and started at age 65.  She also reported drinking 3 large beers daily.  She reported a history of alcoholism and was previously able to stop drinking for 6 years. Her blood pressure was elevated in the office but she wanted to work on diet and exercise. She was referred to the PREP program. She had a nuclear stress test 12/2021 that revealed LVEF 72% and no ischemia. She was seen in the hospital 11/2022 with diverticulitis. Cardiology was consulted for asymptomatic bradycardia to the 40's overnight. No intervention was recommended. She had an echo 09/2022 that revealed LVEF 60-65% with moderate LVH and indeterminate diastolic function. Her aorta was not dilated. An outpatient monitor was ordered but not performed. She had a chest CT 10/2022 and her aorta was reported at 4.1 cm. She was also noted to have aortic and coronary calcification.  Today, she is feeling better for the  most part. We reviewed her recent hospitalization for diverticulitis. Since then, she is feeling very fatigued and a little dizzy. Despite trying to keep herself moving around at home, she has no energy and no appetite. She believes her symptoms are side effects of her new medications. She has been on trazodone for years, so she doesn't think it is the culprit medication. After her discharge, she had stopped smoking for a few days. She is conscientiously trying to cut back: prior to her hospitalization she smoked 1/2 to 3/4 of a pack, and now she smokes 5-8 cigarettes a day. She presents a BP log which is personally reviewed and shows stable readings. In clinic her BP is 124/76. Although she has a low appetite, she is trying to focus on her protein intake. She denies any palpitations, chest pain, shortness of breath, or peripheral edema. No headaches, syncope, orthopnea, or PND.   Past Medical History:  Diagnosis Date   Ascending aortic aneurysm ([place]) 11/29/2021   Depression    Essential hypertension 11/29/2021   GERD (gastroesophageal reflux disease)    Tobacco use     Past Surgical History:  Procedure Laterality Date   ABDOMINAL HYSTERECTOMY     BILATERAL CARPAL TUNNEL RELEASE  2018   2018 on Right, many years ago on left   COLONOSCOPY WITH PROPOFOL N/A 11/26/2021   Procedure: COLONOSCOPY WITH PROPOFOL;  Surgeon: Carol Ada, MD;  Location: [facility];  Service: Endoscopy;  Laterality: N/A;   ESOPHAGOGASTRODUODENOSCOPY (EGD) WITH PROPOFOL N/A  11/26/2021   Procedure: ESOPHAGOGASTRODUODENOSCOPY (EGD) WITH PROPOFOL;  Surgeon: Carol Ada, MD;  Location: [facility];  Service: Endoscopy;  Laterality: N/A;   EYE SURGERY     HEMOSTASIS CLIP PLACEMENT  11/26/2021   Procedure: HEMOSTASIS CLIP PLACEMENT;  Surgeon: Carol Ada, MD;  Location: [facility];  Service: Endoscopy;;   HOT HEMOSTASIS N/A 11/26/2021   Procedure: HOT HEMOSTASIS (ARGON PLASMA COAGULATION/BICAP);  Surgeon: Carol Ada,  MD;  Location: [facility];  Service: Endoscopy;  Laterality: N/A;  EGD and COLON   INCISION AND DRAINAGE PERIRECTAL ABSCESS N/A 02/27/2020   Procedure: IRRIGATION AND DEBRIDEMENT PERIRECTAL ABSCESS;  Surgeon: Donnie Mesa, MD;  Location: [facility];  Service: General;  Laterality: N/A;   IRRIGATION AND DEBRIDEMENT ABSCESS  02/27/2020   Procedure(s) Performed: [place] (N/A Perineum)   POLYPECTOMY  11/26/2021   Procedure: POLYPECTOMY;  Surgeon: Carol Ada, MD;  Location: [facility];  Service: Endoscopy;;     Current Outpatient Medications  Medication Sig Dispense Refill   alendronate (FOSAMAX) 70 MG tablet Take 1 tablet (70 mg total) by mouth every Sunday. 15 tablet 1   ALPRAZolam (XANAX) 1 MG tablet Take 1 mg by mouth 3 (three) times daily as needed for anxiety or sleep.     amLODipine (NORVASC) 10 MG tablet Take 1 tablet (10 mg total) by mouth daily. 30 tablet 2   Calcium Carbonate-Vit D-Min (CALCIUM 1200 PO) Take 1 tablet by mouth daily.     Carboxymethylcellulose Sodium (EYE DROPS OP) Place 1 drop into both eyes 3 (three) times daily as needed (dry eyes).     DULoxetine (CYMBALTA) 60 MG capsule Take 60 mg by mouth at bedtime.     esomeprazole (NEXIUM) 40 MG capsule Take 40 mg by mouth daily at 12 noon.     fluticasone (FLONASE) 50 MCG/ACT nasal spray Place 1 spray into both nostrils daily. (Patient taking differently: Place 1 spray into both nostrils daily as needed for allergies.) 16 g 5   Iron-FA-B Cmp-C-Biot-Probiotic (FUSION PLUS) CAPS TAKE 1 CAPSULE BY MOUTH MONDAY THROUGH FRIDAYS 30 capsule 3   Magnesium 400 MG TABS Take 1 tablet by mouth daily.     Omega-3 Fatty Acids (FISH OIL) 1000 MG CAPS Take 1,000 mg by mouth daily.      traZODone (DESYREL) 100 MG tablet Take 100 mg by mouth at bedtime.     No current facility-administered medications for this visit.    Allergies:   Patient has no known allergies.    Social History:  The patient   reports that she has been smoking cigarettes. She started smoking about 48 years ago. She has a 33.00 pack-year smoking history. She has never used smokeless tobacco. She reports current alcohol use of about 2.0 standard drinks of alcohol per week. She reports that she does not use drugs.   Family History:  The patient's family history includes Alcohol abuse in her father and mother; Dementia in her mother; Diabetes in her brother; Heart attack in her maternal grandmother; Heart disease in her brother and mother.    ROS:   Please see the history of present illness.    (+) Fatigue/Malaise (+) Dizziness (+) Loss of appetite  All other systems are reviewed and negative.    PHYSICAL EXAM: VS:  BP 124/76 (BP Location: Right Arm, Patient Position: Sitting, Cuff Size: Normal)   Pulse 84   Ht '5\' 2"'$  (1.575 m)   Wt 113 lb 4.8 oz (51.4 kg)   SpO2 98%  BMI 20.72 kg/m  , BMI Body mass index is 20.72 kg/m. GENERAL:  Well appearing HEENT:  Pupils equal round and reactive, fundi not visualized, oral mucosa unremarkable NECK:  No jugular venous distention, waveform within normal limits, carotid upstroke brisk and symmetric, no bruits, no thyromegaly LYMPHATICS:  No cervical adenopathy LUNGS:  Clear to auscultation bilaterally HEART:  RRR.  PMI not displaced or sustained,S1 and S2 within normal limits, no S3, no S4, no clicks, no rubs, no murmurs ABD:  Flat, positive bowel sounds normal in frequency in pitch, no bruits, no rebound, no guarding, no midline pulsatile mass, no hepatomegaly, no splenomegaly EXT:  2 plus pulses throughout, no edema, no cyanosis no clubbing SKIN:  No rashes no nodules NEURO:  Cranial nerves II through XII grossly intact, motor grossly intact throughout PSYCH:  Cognitively intact, oriented to person place and time   EKG:   EKG is personally reviewed. 12/05/2022:  EKG was not ordered. 11/29/2021:  ectopic atrial rhythm.  Rate 86 bpm.  Nonspecific ST abnormalities.   CT  Chest  10/04/2022: IMPRESSION: 1. Lung-RADS 2, benign appearance or behavior. Continue annual screening with low-dose chest CT without contrast in 12 months. 2. Similar borderline ascending aortic dilatation at 4.0 cm. This can be re-evaluated on routine follow-up lung cancer screening CT. 3. Left adrenal adenoma. 4. Aortic Atherosclerosis (ICD10-I70.0) and Emphysema (ICD10-J43.9). Coronary artery atherosclerosis.  Echocardiogram  09/05/2022:  1. Left ventricular ejection fraction, by estimation, is 60 to 65%. The  left ventricle has normal function. The left ventricle has no regional  wall motion abnormalities. There is moderate concentric left ventricular  hypertrophy. Left ventricular  diastolic parameters are indeterminate.   2. Right ventricular systolic function is normal. The right ventricular  size is normal. There is normal pulmonary artery systolic pressure.   3. The mitral valve is grossly normal. Trivial mitral valve  regurgitation. No evidence of mitral stenosis.   4. The aortic valve is grossly normal. Aortic valve regurgitation is not  visualized. No aortic stenosis is present.   5. The inferior vena cava is normal in size with greater than 50%  respiratory variability, suggesting right atrial pressure of 3 mmHg.   Comparison(s): No significant change from prior study. Ascending aorta  unchanged on current imaging (72m).   Conclusion(s)/Recommendation(s): Otherwise normal echocardiogram, with  minor abnormalities described in the report. No significant aortic  dilation on available images. Stable from prior echo. Consider following  by CT or MRI if there is concern for  significant dilation.   Exercise Stress Test  12/07/2021:   The study is normal. The study is low risk.   horizontal ST depression was noted in inferior leads during stress   LV perfusion is normal. There is no evidence of ischemia. There is no evidence of infarction.   Left ventricular function is  normal. End diastolic cavity size is normal. End systolic cavity size is normal.   Prior study not available for comparison.  Echo 09/17/2021: 1. Left ventricular ejection fraction, by estimation, is 60 to 65%. Left  ventricular ejection fraction by PLAX is 63 %. The left ventricle has  normal function. The left ventricle has no regional wall motion  abnormalities. There is mild concentric left  ventricular hypertrophy. Left ventricular diastolic parameters are  consistent with Grade I diastolic dysfunction (impaired relaxation). The  average left ventricular global longitudinal strain is -19.9 %. The global  longitudinal strain is normal.   2. Right ventricular systolic function is normal. The right  ventricular  size is normal. There is normal pulmonary artery systolic pressure.   3. Left atrial size was severely dilated.   4. The mitral valve is normal in structure. No evidence of mitral valve  regurgitation. No evidence of mitral stenosis. There is mild prolapse of  posterior leaflet of the mitral valve.   5. The aortic valve is tricuspid. Aortic valve regurgitation is not  visualized. No aortic stenosis is present.   6. Aortic dilatation noted. There is borderline dilatation of the  ascending aorta, measuring 36 mm.   7. The inferior vena cava is normal in size with greater than 50%  respiratory variability, suggesting right atrial pressure of 3 mmHg.    Recent Labs: 11/19/2022: ALT 12; Hemoglobin 12.6; Platelets 400 11/29/2022: BUN 4; Creatinine, Ser 0.69; Potassium 4.4; Sodium 137    Lipid Panel    Component Value Date/Time   CHOL 188 09/12/2022 1301   TRIG 69 09/12/2022 1301   HDL 92 09/12/2022 1301   CHOLHDL 2.0 09/12/2022 1301   LDLCALC 83 09/12/2022 1301      Wt Readings from Last 3 Encounters:  12/05/22 113 lb 4.8 oz (51.4 kg)  11/29/22 113 lb 3.2 oz (51.3 kg)  11/19/22 116 lb (52.6 kg)      ASSESSMENT AND PLAN:  Essential hypertension Blood pressure is  now very well-controlled on amlodipine.  Recommended that she work on increasing her exercise as tolerated.  Atherosclerosis of aorta (HCC) Known aortic atherosclerosis.  She has no ischemic symptoms.  Her LDL is in the 80s.  Recommend that she work on continue to cut back on her smoking.  Increase exercise to at least 150 minutes weekly.  Her ideal LDL ideally needs to be less than 70.  In general, her lipids have been less than 70 without medication.  Will repeat labs in 6 months.  Work on diet and exercise as above.  Disposition:   FU with Ulisses Vondrak C. Oval Linsey, MD, [facility] in 6 months.  Medication Adjustments/Labs and Tests Ordered: Current medicines are reviewed at length with the patient today.  Concerns regarding medicines are outlined above.   No orders of the defined types were placed in this encounter.  No orders of the defined types were placed in this encounter.  Patient Instructions  Medication Instructions:  Your physician recommends that you continue on your current medications as directed. Please refer to the Current Medication list given to you today.   *If you need a refill on your cardiac medications before your next appointment, please call your pharmacy*  Lab Work: NONE  Testing/Procedures: NONE  Follow-Up: At [facility], you and your health needs are our priority.  As part of our continuing mission to provide you with exceptional heart care, we have created designated Provider Care Teams.  These Care Teams include your primary Cardiologist (physician) and Advanced Practice Providers (APPs -  Physician Assistants and Nurse Practitioners) who all work together to provide you with the care you need, when you need it.  We recommend signing up for the patient portal called "MyChart".  Sign up information is provided on this After Visit Summary.  MyChart is used to connect with patients for Virtual Visits (Telemedicine).  Patients are able to view lab/test results,  encounter notes, upcoming appointments, etc.  Non-urgent messages can be sent to your provider as well.   To learn more about what you can do with MyChart, go to [email or web address].    Your next appointment:   6 month(s)  Provider:   Skeet Latch, MD       [facility],acting as a scribe for Skeet Latch, MD.,have documented all relevant documentation on the behalf of Skeet Latch, MD,as directed by  Skeet Latch, MD while in the presence of Skeet Latch, MD.  I, [place] Oval Linsey, MD have reviewed all documentation for this visit.  The documentation of the exam, diagnosis, procedures, and orders on 12/05/2022 are all accurate and complete.   Signed, Raelene Trew C. Oval Linsey, MD, [facility]  12/05/2022 4:17 PM    [facility]

## 2022-12-05 NOTE — Patient Instructions (Signed)
Medication Instructions:  Your physician recommends that you continue on your current medications as directed. Please refer to the Current Medication list given to you today.   *If you need a refill on your cardiac medications before your next appointment, please call your pharmacy*  Lab Work: NONE  Testing/Procedures: NONE  Follow-Up: At [facility], you and your health needs are our priority.  As part of our continuing mission to provide you with exceptional heart care, we have created designated Provider Care Teams.  These Care Teams include your primary Cardiologist (physician) and Advanced Practice Providers (APPs -  Physician Assistants and Nurse Practitioners) who all work together to provide you with the care you need, when you need it.  We recommend signing up for the patient portal called "MyChart".  Sign up information is provided on this After Visit Summary.  MyChart is used to connect with patients for Virtual Visits (Telemedicine).  Patients are able to view lab/test results, encounter notes, upcoming appointments, etc.  Non-urgent messages can be sent to your provider as well.   To learn more about what you can do with MyChart, go to [email or web address].    Your next appointment:   6 month(s)  Provider:   Skeet Latch, MD

## 2022-12-05 NOTE — Assessment & Plan Note (Signed)
Known aortic atherosclerosis.  She has no ischemic symptoms.  Her LDL is in the 80s.  Recommend that she work on continue to cut back on her smoking.  Increase exercise to at least 150 minutes weekly.  Her ideal LDL ideally needs to be less than 70.  In general, her lipids have been less than 70 without medication.  Will repeat labs in 6 months.  Work on diet and exercise as above.

## 2022-12-06 DIAGNOSIS — K219 Gastro-esophageal reflux disease without esophagitis: Secondary | ICD-10-CM | POA: Diagnosis not present

## 2022-12-06 DIAGNOSIS — K5730 Diverticulosis of large intestine without perforation or abscess without bleeding: Secondary | ICD-10-CM | POA: Diagnosis not present

## 2022-12-06 DIAGNOSIS — D509 Iron deficiency anemia, unspecified: Secondary | ICD-10-CM | POA: Diagnosis not present

## 2022-12-06 DIAGNOSIS — R634 Abnormal weight loss: Secondary | ICD-10-CM | POA: Diagnosis not present

## 2022-12-06 DIAGNOSIS — K449 Diaphragmatic hernia without obstruction or gangrene: Secondary | ICD-10-CM | POA: Diagnosis not present

## 2022-12-12 ENCOUNTER — Ambulatory Visit: Payer: Self-pay

## 2022-12-12 DIAGNOSIS — F411 Generalized anxiety disorder: Secondary | ICD-10-CM | POA: Diagnosis not present

## 2022-12-12 DIAGNOSIS — F331 Major depressive disorder, recurrent, moderate: Secondary | ICD-10-CM | POA: Diagnosis not present

## 2022-12-12 NOTE — Patient Instructions (Signed)
Visit Information  Thank you for taking time to visit with me today. Please don't hesitate to contact me if I can be of assistance to you.   Following are the goals we discussed today:   Goals Addressed             This Visit's Progress    Grief - loss of close friends       Care Coordination Interventions: Evaluation of current treatment plan related to grief and patient's adherence to plan as established by provider Determined patient has lost several of her closet friends close together, she is feeling down and is grieving  Educated patient regarding availability of [facility] for grief counseling, patient declines at this time and is currently established with outpatient counseling Instructed patient to notify her doctor promptly if her symptoms worsen or she needs additional grief counseling  Active listening / Reflection utilized  Emotional Support Provided         To monitor BP at home as directed       Care Coordination Interventions: Evaluation of current treatment plan related to hypertension self management and patient's adherence to plan as established by provider Reviewed medications with patient and discussed importance of compliance Advised patient, providing education and rationale, to monitor blood pressure daily and record, calling PCP for findings outside established parameters Provided education on prescribed diet low Sodium Screening for signs and symptoms of depression related to chronic disease state  Educated patient on accurate technique to monitor BP, educated on target BP         To overcome acute fatigue and return to baseline       Care Coordination Interventions: Evaluation of current treatment plan related to fatigue and patient's adherence to plan as established by provider Determined patient feels her fatigue has greatly improved Discussed patient is focusing on increasing her meal frequency to include a high protein diet Reviewed and discussed  recent MD follow up with PCP, GI and Cardiology  Review of patient status, including review of consultant's reports, relevant laboratory and other test results, and medications completed Instructed patient to inform her health care team of new symptoms or concerns         Our next appointment is by telephone on 12/26/22 at 12 PM  Please call the care guide team at [phone] if you need to cancel or reschedule your appointment.   If you are experiencing a Mental Health or [place] or need someone to talk to, please call 1-800-273-TALK (toll free, 24 hour hotline) go to [facility] [address], [place] [phone])  Patient verbalizes understanding of instructions and care plan provided today and agrees to view in [place]. Active MyChart status and patient understanding of how to access instructions and care plan via MyChart confirmed with patient.     Barb Merino, RN, BSN, CCM Care Management Coordinator [facility] Care Management Direct Phone: [phone]

## 2022-12-12 NOTE — Patient Outreach (Signed)
  Care Coordination   12/12/2022 Name: [name] MRN: [id number] DOB: 1950/11/14   Care Coordination Outreach Attempts:  An unsuccessful telephone outreach was attempted for a scheduled appointment today.  Follow Up Plan:  Additional outreach attempts will be made to offer the patient care coordination information and services.   Encounter Outcome:  No Answer   Care Coordination Interventions:  No, not indicated    Barb Merino, RN, BSN, CCM Care Management Coordinator [facility] Care Management  Direct Phone: [phone]

## 2022-12-12 NOTE — Patient Outreach (Signed)
  Care Coordination   Follow Up Visit Note   12/12/2022 Name: MALYIAH FELLOWS MRN: [id number] DOB: 1951/08/22  DOROTHE ELMORE is a 72 y.o. year old female who sees Glendale Chard, MD for primary care. I spoke with  Tula Nakayama by phone today.  What matters to the patients health and wellness today?  Patient will continue to monitor her BP at home and report abnormal readings. She will keep all scheduled MD appointments.     Goals Addressed             This Visit's Progress    Grief - loss of close friends       Care Coordination Interventions: Evaluation of current treatment plan related to grief and patient's adherence to plan as established by provider Determined patient has lost several of her closet friends close together, she is feeling down and is grieving  Educated patient regarding availability of [facility] for grief counseling, patient declines at this time and is currently established with outpatient counseling Instructed patient to notify her doctor promptly if her symptoms worsen or she needs additional grief counseling  Active listening / Reflection utilized  Emotional Support Provided         To monitor BP at home as directed       Care Coordination Interventions: Evaluation of current treatment plan related to hypertension self management and patient's adherence to plan as established by provider Reviewed medications with patient and discussed importance of compliance Advised patient, providing education and rationale, to monitor blood pressure daily and record, calling PCP for findings outside established parameters Provided education on prescribed diet low Sodium Screening for signs and symptoms of depression related to chronic disease state  Educated patient on accurate technique to monitor BP, educated on target BP         To overcome acute fatigue and return to baseline       Care Coordination Interventions: Evaluation of current treatment plan  related to fatigue and patient's adherence to plan as established by provider Determined patient feels her fatigue has greatly improved Discussed patient is focusing on increasing her meal frequency to include a high protein diet Reviewed and discussed recent MD follow up with PCP, GI and Cardiology  Review of patient status, including review of consultant's reports, relevant laboratory and other test results, and medications completed Instructed patient to inform her health care team of new symptoms or concerns         SDOH assessments and interventions completed:  No     Care Coordination Interventions:  Yes, provided   Follow up plan: Follow up call scheduled for 12/26/22 @12  PM    Encounter Outcome:  Pt. Visit Completed

## 2022-12-13 DIAGNOSIS — F411 Generalized anxiety disorder: Secondary | ICD-10-CM | POA: Diagnosis not present

## 2022-12-26 ENCOUNTER — Ambulatory Visit: Payer: Self-pay

## 2022-12-26 NOTE — Patient Instructions (Signed)
Visit Information  Thank you for taking time to visit with me today. Please don't hesitate to contact me if I can be of assistance to you.   Following are the goals we discussed today:   Goals Addressed             This Visit's Progress    To monitor BP at home as directed       Care Coordination Interventions: Evaluation of current treatment plan related to hypertension self management and patient's adherence to plan as established by provider Determined patient continues to monitor her BP at home, she reports getting BP readings with normal range <130/80 Reinforced ongoing BP monitoring as directed, reporting abnormal readings or concerns to her doctor promptly        To overcome acute fatigue and return to baseline       Care Coordination Interventions: Evaluation of current treatment plan related to fatigue and patient's adherence to plan as established by provider Determined patient feels her fatigue has greatly improved Instructed patient to balance her activity with rest and to stay well hydrated with water Educated patient about the benefits of establishing a routine exercise regimen Educated patient about the PREP program, patient will consider Mailed printed PREP brochure to review and consider        Our next appointment is by telephone on 02/24/23 at 11:00 AM  Please call the care guide team at [phone] if you need to cancel or reschedule your appointment.   If you are experiencing a Mental Health or [place] or need someone to talk to, please call 1-800-273-TALK (toll free, 24 hour hotline) go to [facility] [address] [place] [phone])  Patient verbalizes understanding of instructions and care plan provided today and agrees to view in [place]. Active MyChart status and patient understanding of how to access instructions and care plan via MyChart confirmed with patient.     Barb Merino, RN,  BSN, CCM Care Management Coordinator [facility] Care Management  Direct Phone: [phone]

## 2022-12-26 NOTE — Patient Outreach (Signed)
  Care Coordination   Follow Up Visit Note   12/26/2022 Name: Maureen Rodgers MRN: [id number] DOB: 1951-05-16  Maureen Rodgers is a 72 y.o. year old female who sees Glendale Chard, MD for primary care. I spoke with  Tula Nakayama by phone today.  What matters to the patients health and wellness today?  Patient will consider participating in the PREP program. She will continue to monitor her BP at home.     Goals Addressed             This Visit's Progress    To monitor BP at home as directed       Care Coordination Interventions: Evaluation of current treatment plan related to hypertension self management and patient's adherence to plan as established by provider Determined patient continues to monitor her BP at home, she reports getting BP readings with normal range <130/80 Reinforced ongoing BP monitoring as directed, reporting abnormal readings or concerns to her doctor promptly        To overcome acute fatigue and return to baseline       Care Coordination Interventions: Evaluation of current treatment plan related to fatigue and patient's adherence to plan as established by provider Determined patient feels her fatigue has greatly improved Instructed patient to balance her activity with rest and to stay well hydrated with water Educated patient about the benefits of establishing a routine exercise regimen Educated patient about the PREP program, patient will consider Mailed printed PREP brochure to review and consider    Interventions Today    Flowsheet Row Most Recent Value  Chronic Disease   Chronic disease during today's visit Hypertension (HTN), Other  [fatigue]  General Interventions   General Interventions Discussed/Reviewed General Interventions Discussed, General Interventions Reviewed, Doctor Visits, Labs  Doctor Visits Discussed/Reviewed Doctor Visits Discussed, Doctor Visits Reviewed, PCP, Specialist  Exercise Interventions   Exercise  Discussed/Reviewed Exercise Discussed, Exercise Reviewed, Physical Activity  Physical Activity Discussed/Reviewed Physical Activity Reviewed, Physical Activity Discussed, PREP  Education Interventions   Education Provided Provided Printed Education, Provided Education  Provided Verbal Education On Exercise, When to see the doctor          SDOH assessments and interventions completed:  No     Care Coordination Interventions:  Yes, provided   Follow up plan: Follow up call scheduled for 02/24/23 @11 :00 AM    Encounter Outcome:  Pt. Visit Completed

## 2023-01-08 ENCOUNTER — Other Ambulatory Visit: Payer: Self-pay | Admitting: Internal Medicine

## 2023-01-09 ENCOUNTER — Ambulatory Visit (HOSPITAL_BASED_OUTPATIENT_CLINIC_OR_DEPARTMENT_OTHER): Payer: Medicare Other | Admitting: Family

## 2023-01-30 ENCOUNTER — Inpatient Hospital Stay: Admission: RE | Admit: 2023-01-30 | Payer: Medicare Other | Source: Ambulatory Visit

## 2023-02-10 ENCOUNTER — Inpatient Hospital Stay: Admission: RE | Admit: 2023-02-10 | Payer: Medicare Other | Source: Ambulatory Visit

## 2023-02-18 ENCOUNTER — Other Ambulatory Visit: Payer: Self-pay | Admitting: Internal Medicine

## 2023-02-20 DIAGNOSIS — H5711 Ocular pain, right eye: Secondary | ICD-10-CM | POA: Diagnosis not present

## 2023-02-20 DIAGNOSIS — H04123 Dry eye syndrome of bilateral lacrimal glands: Secondary | ICD-10-CM | POA: Diagnosis not present

## 2023-02-20 DIAGNOSIS — Z961 Presence of intraocular lens: Secondary | ICD-10-CM | POA: Diagnosis not present

## 2023-02-24 ENCOUNTER — Telehealth: Payer: Self-pay | Admitting: Cardiovascular Disease

## 2023-02-24 ENCOUNTER — Ambulatory Visit: Payer: Self-pay

## 2023-02-24 MED ORDER — AMLODIPINE BESYLATE 10 MG PO TABS: 10.0000 mg | ORAL_TABLET | Freq: Every day | ORAL | 3 refills | Status: DC

## 2023-02-24 NOTE — Patient Outreach (Signed)
  Care Coordination   02/24/2023 Name: Maureen Rodgers MRN: [id number] DOB: 01/29/1951   Care Coordination Outreach Attempts:  An unsuccessful telephone outreach was attempted for a scheduled appointment today.  Follow Up Plan:  Additional outreach attempts will be made to offer the patient care coordination information and services.   Encounter Outcome:  No Answer   Care Coordination Interventions:  No, not indicated    Delsa Sale, RN, BSN, CCM Care Management Coordinator [facility] Care Management  Direct Phone: [phone]

## 2023-02-24 NOTE — Patient Outreach (Signed)
Care Coordination   Follow Up Visit Note   02/24/2023 Name: Maureen Rodgers MRN: [id number] DOB: Jun 03, 1951  Maureen Rodgers is a 72 y.o. year old female who sees Maureen Peng, MD for primary care. I spoke with  Maureen Rodgers by phone today.  What matters to the patients health and wellness today?  Patient would like to get a refill for her Amlodipine. She would like to consider participating in the PREP program.     Goals Addressed             This Visit's Progress    To monitor BP at home as directed       Care Coordination Interventions: Evaluation of current treatment plan related to hypertension self management and patient's adherence to plan as established by provider Reviewed medications with patient and determined patient is currently out of her Amlodipine, educated patient on dangers of missing doses of BP med due to risk of rebound hypertension and she verbalizes understanding  Discussed her pharmacy did not auto-refill and was unable to obtain a refill on patient's behalf Determined patient was started on Amlodipine following her last hospitalization, reviewed and discussed with patient Maureen Rodgers her Cardiologist should okay refill as patient was last seen by this MD in March Provided patient the contact number for Maureen Rodgers and discussed patient will call this provider now and explain her circumstances and request a refill  Advised patient, providing education and rationale, to monitor blood pressure daily and record, calling PCP for findings outside established parameters Discussed complications of poorly controlled blood pressure such as heart disease, stroke, circulatory complications, vision complications, kidney impairment, sexual dysfunction Screening for signs and symptoms of depression related to chronic disease state  Assessed social determinant of health barriers Reviewed scheduled/upcoming provider appointments including: next PCP follow up with Dr.  Allyne Rodgers scheduled for 04/13/23 @11 :00 AM AWV/11:40 am with Maureen Rodgers         To overcome acute fatigue and return to baseline       Care Coordination Interventions: Evaluation of current treatment plan related to fatigue and patient's adherence to plan as established by provider Discussed with patient she feels her fatigue may be secondary to depression  Educated patient on the benefits of implementing a routine exercise program to help conquer fatigue, improve mental health and help patient get socialization outside of her circle/home Provided verbal and printed educational materials to patient regarding the PREP program and patient will consider Determined patient  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, she states she may join an AA program but has not decided Active listening / Reflection utilized  Emotional Support Provided Determined patient has discussed her symptoms with PCP, she is established with psychiatry for counseling and is taking her anti-depressant as prescribed  Educated patient about the [facility] LCSW availability for telephonic counseling, patient declines at this time and will continue to follow established therapist Instructed patient to keep Maureen Rodgers well informed of her condition related to depression and alcohol use  Discussed plans with patient for ongoing care coordination follow up and provided patient with direct contact information for this RN Care Coordinator      Interventions Today    Flowsheet Row Most Recent Value  Chronic Disease   Chronic disease during today's visit Hypertension (HTN)  General Interventions   General Interventions Discussed/Reviewed General Interventions Discussed, General Interventions Reviewed, Doctor Visits  Doctor Visits Discussed/Reviewed Doctor Visits Discussed, Doctor Visits Reviewed, PCP, Specialist  Exercise Interventions   Exercise Discussed/Reviewed Exercise Discussed, Exercise Reviewed, Physical  Activity  Physical Activity Discussed/Reviewed Physical Activity Discussed, Physical Activity Reviewed, PREP, Types of exercise  Education Interventions   Education Provided Provided Education, Provided Printed Education  Provided Verbal Education On Medication, When to see the doctor, Exercise, Mental Health/Coping with Illness  Mental Health Interventions   Mental Health Discussed/Reviewed Mental Health Reviewed, Mental Health Discussed, Depression, Grief and Loss, Coping Strategies  Pharmacy Interventions   Pharmacy Dicussed/Reviewed Medications and their functions, Pharmacy Topics Discussed, Pharmacy Topics Reviewed          SDOH assessments and interventions completed:  No     Care Coordination Interventions:  Yes, provided   Follow up plan: Follow up call scheduled for 04/17/23 @12 ;30 PM    Encounter Outcome:  Pt. Visit Completed

## 2023-02-24 NOTE — Telephone Encounter (Signed)
*  STAT* If patient is at the pharmacy, call can be transferred to refill team.   1. Which medications need to be refilled? (please list name of each medication and dose if known) amLODipine (NORVASC) 10 MG tablet (Expired)   2. Which pharmacy/location (including street and city if local pharmacy) is medication to be sent to? [id number] [place], [place] - [address]  3. Do they need a 30 day or 90 day supply? 90

## 2023-02-24 NOTE — Patient Instructions (Signed)
Visit Information  Thank you for taking time to visit with me today. Please don't hesitate to contact me if I can be of assistance to you.   Following are the goals we discussed today:   Goals Addressed             This Visit's Progress    To monitor BP at home as directed       Care Coordination Interventions: Evaluation of current treatment plan related to hypertension self management and patient's adherence to plan as established by provider Reviewed medications with patient and determined patient is currently out of her Amlodipine, educated patient on dangers of missing doses of BP med due to risk of rebound hypertension and she verbalizes understanding  Discussed her pharmacy did not auto-refill and was unable to obtain a refill on patient's behalf Determined patient was started on Amlodipine following her last hospitalization, reviewed and discussed with patient Dr. Duke Salvia her Cardiologist should okay refill as patient was last seen by this MD in March Provided patient the contact number for Dr. Duke Salvia and discussed patient will call this provider now and explain her circumstances and request a refill  Advised patient, providing education and rationale, to monitor blood pressure daily and record, calling PCP for findings outside established parameters Discussed complications of poorly controlled blood pressure such as heart disease, stroke, circulatory complications, vision complications, kidney impairment, sexual dysfunction Screening for signs and symptoms of depression related to chronic disease state  Assessed social determinant of health barriers Reviewed scheduled/upcoming provider appointments including: next PCP follow up with Dr. Allyne Gee scheduled for 04/13/23 @11 :00 AM AWV/11:40 am with Dr. Allyne Gee         To overcome acute fatigue and return to baseline       Care Coordination Interventions: Evaluation of current treatment plan related to fatigue and patient's  adherence to plan as established by provider Discussed with patient she feels her fatigue may be secondary to depression  Educated patient on the benefits of implementing a routine exercise program to help conquer fatigue, improve mental health and help patient get socialization outside of her circle/home Provided verbal and printed educational materials to patient regarding the PREP program and patient will consider Determined patient  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 Allyne Gee well informed of her condition related to depression and alcohol use  Discussed plans with patient for ongoing care coordination follow up and provided patient with direct contact information for this RN Care Coordinator          Our next appointment is by telephone on 04/17/23 at 12:30 PM  Please call the care guide team at [phone] if you need to cancel or reschedule your appointment.   If you are experiencing a Mental Health or Behavioral Health Crisis or need someone to talk to, please call 1-800-273-TALK (toll free, 24 hour hotline) go to [facility] [address] [place] [phone])  Patient verbalizes understanding of instructions and care plan provided today and agrees to view in MyChart. Active MyChart status and patient  understanding of how to access instructions and care plan via MyChart confirmed with patient.     Delsa Sale, RN, BSN, CCM Care Management  Coordinator [facility] Care Management Direct Phone: [phone]

## 2023-03-02 ENCOUNTER — Ambulatory Visit
Admission: RE | Admit: 2023-03-02 | Discharge: 2023-03-02 | Disposition: A | Discharge status: Home / Self Care | Payer: Medicare Other | Source: Ambulatory Visit | Attending: Internal Medicine | Admitting: Internal Medicine

## 2023-03-02 DIAGNOSIS — Z1231 Encounter for screening mammogram for malignant neoplasm of breast: Secondary | ICD-10-CM

## 2023-03-13 DIAGNOSIS — F411 Generalized anxiety disorder: Secondary | ICD-10-CM | POA: Diagnosis not present

## 2023-03-13 DIAGNOSIS — F331 Major depressive disorder, recurrent, moderate: Secondary | ICD-10-CM | POA: Diagnosis not present

## 2023-03-17 DIAGNOSIS — Z6823 Body mass index (BMI) 23.0-23.9, adult: Secondary | ICD-10-CM | POA: Diagnosis not present

## 2023-03-17 DIAGNOSIS — M810 Age-related osteoporosis without current pathological fracture: Secondary | ICD-10-CM | POA: Diagnosis not present

## 2023-03-17 DIAGNOSIS — L738 Other specified follicular disorders: Secondary | ICD-10-CM | POA: Diagnosis not present

## 2023-03-17 DIAGNOSIS — Z01411 Encounter for gynecological examination (general) (routine) with abnormal findings: Secondary | ICD-10-CM | POA: Diagnosis not present

## 2023-03-17 DIAGNOSIS — Z01419 Encounter for gynecological examination (general) (routine) without abnormal findings: Secondary | ICD-10-CM | POA: Diagnosis not present

## 2023-03-22 ENCOUNTER — Other Ambulatory Visit: Payer: Self-pay | Admitting: Obstetrics & Gynecology

## 2023-03-22 DIAGNOSIS — M810 Age-related osteoporosis without current pathological fracture: Secondary | ICD-10-CM

## 2023-03-30 DIAGNOSIS — H04123 Dry eye syndrome of bilateral lacrimal glands: Secondary | ICD-10-CM | POA: Diagnosis not present

## 2023-03-30 DIAGNOSIS — H5711 Ocular pain, right eye: Secondary | ICD-10-CM | POA: Diagnosis not present

## 2023-03-30 DIAGNOSIS — Z961 Presence of intraocular lens: Secondary | ICD-10-CM | POA: Diagnosis not present

## 2023-04-13 ENCOUNTER — Encounter: Payer: Self-pay | Admitting: Internal Medicine

## 2023-04-13 ENCOUNTER — Ambulatory Visit: Payer: Medicare Other | Admitting: Internal Medicine

## 2023-04-13 ENCOUNTER — Ambulatory Visit (INDEPENDENT_AMBULATORY_CARE_PROVIDER_SITE_OTHER): Payer: Medicare Other

## 2023-04-13 VITALS — BP 108/76 | HR 81 | Temp 98.0°F | Ht 62.0 in | Wt 124.0 lb

## 2023-04-13 DIAGNOSIS — F1020 Alcohol dependence, uncomplicated: Secondary | ICD-10-CM

## 2023-04-13 DIAGNOSIS — I700 Atherosclerosis of aorta: Secondary | ICD-10-CM

## 2023-04-13 DIAGNOSIS — Z0000 Encounter for general adult medical examination without abnormal findings: Secondary | ICD-10-CM | POA: Diagnosis not present

## 2023-04-13 DIAGNOSIS — F331 Major depressive disorder, recurrent, moderate: Secondary | ICD-10-CM

## 2023-04-13 DIAGNOSIS — I2510 Atherosclerotic heart disease of native coronary artery without angina pectoris: Secondary | ICD-10-CM

## 2023-04-13 DIAGNOSIS — Z6822 Body mass index (BMI) 22.0-22.9, adult: Secondary | ICD-10-CM

## 2023-04-13 DIAGNOSIS — D649 Anemia, unspecified: Secondary | ICD-10-CM | POA: Insufficient documentation

## 2023-04-13 DIAGNOSIS — F17210 Nicotine dependence, cigarettes, uncomplicated: Secondary | ICD-10-CM | POA: Diagnosis not present

## 2023-04-13 DIAGNOSIS — I119 Hypertensive heart disease without heart failure: Secondary | ICD-10-CM

## 2023-04-13 NOTE — Progress Notes (Signed)
Subjective:   Maureen Rodgers is a 72 y.o. female who presents for Medicare Annual (Subsequent) preventive examination.  Visit Complete: In person   Review of Systems     Cardiac Risk Factors include: advanced age (>73men, >8 women);dyslipidemia;hypertension     Objective:    Today's Vitals   04/13/23 1108  BP: 108/76  Pulse: 81  Temp: 98 F (36.7 C)  TempSrc: Oral  SpO2: 98%  Weight: 124 lb (56.2 kg)  Height: 5\' 2"  (1.575 m)   Body mass index is 22.68 kg/m.     04/13/2023   11:14 AM 11/18/2022   10:25 PM 03/24/2022   11:38 AM 11/26/2021    9:27 AM 02/24/2021    3:53 PM 02/27/2020   10:30 AM 02/26/2020    3:55 PM  Advanced Directives  Does Patient Have a Medical Advance Directive? No No No No No No No  Would patient like information on creating a medical advance directive? No - Patient declined No - Patient declined No - Patient declined Yes (MAU/Ambulatory/Procedural Areas - Information given) Yes (MAU/Ambulatory/Procedural Areas - Information given) No - Patient declined No - Patient declined    Current Medications (verified) Outpatient Encounter Medications as of 04/13/2023  Medication Sig   alendronate (FOSAMAX) 70 MG tablet TAKE 1 TABLET (70 MG TOTAL) BY MOUTH EVERY SUNDAY.   ALPRAZolam (XANAX) 1 MG tablet Take 1 mg by mouth 3 (three) times daily as needed for anxiety or sleep.   amLODipine (NORVASC) 10 MG tablet Take 1 tablet (10 mg total) by mouth daily.   Calcium Carbonate-Vit D-Min (CALCIUM 1200 PO) Take 1 tablet by mouth daily.   Carboxymethylcellulose Sodium (EYE DROPS OP) Place 1 drop into both eyes 3 (three) times daily as needed (dry eyes).   DULoxetine (CYMBALTA) 60 MG capsule Take 60 mg by mouth at bedtime.   esomeprazole (NEXIUM) 40 MG capsule Take 40 mg by mouth daily at 12 noon.   fluticasone (FLONASE) 50 MCG/ACT nasal spray Place 1 spray into both nostrils daily. (Patient taking differently: Place 1 spray into both nostrils daily as needed for  allergies.)   Iron-FA-B Cmp-C-Biot-Probiotic (FUSION PLUS) CAPS TAKE 1 CAPSULE BY MOUTH MONDAY THROUGH FRIDAYS   Magnesium 400 MG TABS Take 1 tablet by mouth daily.   Omega-3 Fatty Acids (FISH OIL) 1000 MG CAPS Take 1,000 mg by mouth daily.    traZODone (DESYREL) 100 MG tablet Take 100 mg by mouth at bedtime.   No facility-administered encounter medications on file as of 04/13/2023.    Allergies (verified) Patient has no known allergies.   History: Past Medical History:  Diagnosis Date   Ascending aortic aneurysm (HCC) 11/29/2021   Depression    Essential hypertension 11/29/2021   GERD (gastroesophageal reflux disease)    Tobacco use    Past Surgical History:  Procedure Laterality Date   ABDOMINAL HYSTERECTOMY     BILATERAL CARPAL TUNNEL RELEASE  2018   2018 on Right, many years ago on left   COLONOSCOPY WITH PROPOFOL N/A 11/26/2021   Procedure: COLONOSCOPY WITH PROPOFOL;  Surgeon: Jeani Hawking, MD;  Location: [facility];  Service: Endoscopy;  Laterality: N/A;   ESOPHAGOGASTRODUODENOSCOPY (EGD) WITH PROPOFOL N/A 11/26/2021   Procedure: ESOPHAGOGASTRODUODENOSCOPY (EGD) WITH PROPOFOL;  Surgeon: Jeani Hawking, MD;  Location: [facility];  Service: Endoscopy;  Laterality: N/A;   EYE SURGERY     HEMOSTASIS CLIP PLACEMENT  11/26/2021   Procedure: HEMOSTASIS CLIP PLACEMENT;  Surgeon: Jeani Hawking, MD;  Location: [facility];  Service:  Endoscopy;;   HOT HEMOSTASIS N/A 11/26/2021   Procedure: HOT HEMOSTASIS (ARGON PLASMA COAGULATION/BICAP);  Surgeon: Jeani Hawking, MD;  Location: [facility];  Service: Endoscopy;  Laterality: N/A;  EGD and COLON   INCISION AND DRAINAGE PERIRECTAL ABSCESS N/A 02/27/2020   Procedure: IRRIGATION AND DEBRIDEMENT PERIRECTAL ABSCESS;  Surgeon: Manus Rudd, MD;  Location: [facility] OR;  Service: General;  Laterality: N/A;   IRRIGATION AND DEBRIDEMENT ABSCESS  02/27/2020   Procedure(s) Performed: IRRIGATION AND DEBRIDEMENT PERIRECTAL ABSCESS (N/A Perineum)    POLYPECTOMY  11/26/2021   Procedure: POLYPECTOMY;  Surgeon: Jeani Hawking, MD;  Location: [facility];  Service: Endoscopy;;   Family History  Problem Relation Age of Onset   Heart disease Mother    Dementia Mother    Alcohol abuse Mother    Alcohol abuse Father    Heart disease Brother    Diabetes Brother    Heart attack Maternal Grandmother    Social History   Socioeconomic History   Marital status: Single    Spouse name: Not on file   Number of children: Not on file   Years of education: Not on file   Highest education level: Not on file  Occupational History   Occupation: retired  Tobacco Use   Smoking status: Every Day    Current packs/day: 0.75    Average packs/day: 0.7 packs/day for 48.5 years (36.4 ttl pk-yrs)    Types: Cigarettes    Start date: 10/03/1974   Smokeless tobacco: Never   Tobacco comments:    has been smoking 1ppd x 18 months, in addition to above history. Right now, she is not ready to quit. Currently in therapy. She is smoking no more than 15 cigs/day.   Vaping Use   Vaping status: Never Used  Substance and Sexual Activity   Alcohol use: Yes    Alcohol/week: 2.0 standard drinks of alcohol    Types: 2 Cans of beer per week    Comment: 3 beers a day   Drug use: No   Sexual activity: Not Currently  Other Topics Concern   Not on file  Social History Narrative   Not on file   Social Determinants of Health   Financial Resource Strain: Low Risk  (04/13/2023)   Overall Financial Resource Strain (CARDIA)    Difficulty of Paying Living Expenses: Not hard at all  Food Insecurity: No Food Insecurity (04/13/2023)   Hunger Vital Sign    Worried About Running Out of Food in the Last Year: Never true    Ran Out of Food in the Last Year: Never true  Transportation Needs: No Transportation Needs (04/13/2023)   PRAPARE - Administrator, Civil Service (Medical): No    Lack of Transportation (Non-Medical): No  Physical Activity: Inactive (04/13/2023)    Exercise Vital Sign    Days of Exercise per Week: 0 days    Minutes of Exercise per Session: 0 min  Stress: Stress Concern Present (04/13/2023)   [employer] of Occupational Health - Occupational Stress Questionnaire    Feeling of Stress : To some extent  Social Connections: Socially Isolated (04/13/2023)   Social Connection and Isolation Panel [NHANES]    Frequency of Communication with Friends and Family: More than three times a week    Frequency of Social Gatherings with Friends and Family: Never    Attends Religious Services: Never    Database administrator or Organizations: No    Attends Banker Meetings: Never    Marital  Status: Never married    Tobacco Counseling Ready to quit: Not Answered Counseling given: Not Answered Tobacco comments: has been smoking 1ppd x 18 months, in addition to above history. Right now, she is not ready to quit. Currently in therapy. She is smoking no more than 15 cigs/day.    Clinical Intake:  Pre-visit preparation completed: Yes  Pain : No/denies pain     Nutritional Status: BMI of 19-24  Normal Nutritional Risks: None Diabetes: No  How often do you need to have someone help you when you read instructions, pamphlets, or other written materials from your doctor or pharmacy?: 1 - Never  Interpreter Needed?: No  Information entered by :: NAllen LPN   Activities of Daily Living    04/13/2023   11:09 AM 11/19/2022    5:30 PM  In your present state of health, do you have any difficulty performing the following activities:  Hearing? 0 0  Vision? 0 0  Difficulty concentrating or making decisions? 1 0  Comment trouble with memory   Walking or climbing stairs? 0 0  Dressing or bathing? 0 0  Doing errands, shopping? 0 0  Preparing Food and eating ? N   Using the Toilet? N   In the past six months, have you accidently leaked urine? N   Do you have problems with loss of bowel control? N   Managing your Medications? N    Managing your Finances? N   Housekeeping or managing your Housekeeping? N     Patient Care Team: Dorothyann Peng, MD as PCP - General (Internal Medicine) Chilton Si, MD as PCP - Cardiology (Cardiology) Clarene Duke, Karma Lew, RN as [facility] Care Management  Indicate any recent Medical Services you may have received from other than [facility] providers in the past year (date may be approximate).     Assessment:   This is a routine wellness examination for [place].  Hearing/Vision screen Hearing Screening - Comments:: Denies hearing issues Vision Screening - Comments:: Regular eye exams, [facility]  Dietary issues and exercise activities discussed:     Goals Addressed             This Visit's Progress    Patient Stated       04/13/2023, wants to quit smoking and drinking       Depression Screen    04/13/2023   11:15 AM 04/13/2023   10:48 AM 11/29/2022   11:40 AM 09/12/2022   11:44 AM 03/24/2022   11:38 AM 03/10/2022   11:03 AM 08/30/2021   10:55 AM  PHQ 2/9 Scores  PHQ - 2 Score 0 0 0 3 2 3 5   PHQ- 9 Score 0 0  10 4 6 7     Fall Risk    04/13/2023   11:15 AM 04/13/2023   10:48 AM 11/29/2022   11:40 AM 03/24/2022   11:38 AM 03/10/2022   11:03 AM  Fall Risk   Falls in the past year? 0 0 0 0 0  Number falls in past yr: 0 0 0 0 0  Injury with Fall? 0 0 0 0 0  Risk for fall due to : Medication side effect No Fall Risks No Fall Risks Impaired balance/gait;Impaired mobility;Impaired vision No Fall Risks  Follow up Falls prevention discussed;Falls evaluation completed Falls evaluation completed Falls evaluation completed  Falls evaluation completed    MEDICARE RISK AT HOME:  Medicare Risk at Home - 04/13/23 1115     If so, are there any  without handrails? No    Home free of loose throw rugs in walkways, pet beds, electrical cords, etc? Yes    Adequate lighting in your home to reduce risk of falls? Yes    Life alert? No    Use of a cane, walker or  w/c? No    Grab bars in the bathroom? No    Shower chair or bench in shower? No    Elevated toilet seat or a handicapped toilet? Yes             TIMED UP AND GO:  Was the test performed?  Yes  Length of time to ambulate 10 feet: 5 sec Gait steady and fast without use of assistive device    Cognitive Function:        04/13/2023   11:15 AM 03/24/2022   11:43 AM 02/24/2021    3:59 PM 02/12/2020    3:14 PM 12/20/2018   10:19 AM  6CIT Screen  What Year? 0 points 0 points 0 points 0 points 0 points  What month? 0 points 0 points 0 points 0 points 0 points  What time? 0 points 0 points 0 points 0 points 0 points  Count back from 20 2 points 0 points 2 points 0 points 0 points  Months in reverse 0 points 0 points 0 points 0 points 0 points  Repeat phrase 2 points 4 points 4 points 4 points 2 points  Total Score 4 points 4 points 6 points 4 points 2 points    Immunizations Immunization History  Administered Date(s) Administered   Fluad Quad(high Dose 65+) 08/11/2020, 09/12/2022   Influenza, High Dose Seasonal PF 06/26/2019   Moderna SARS-COV2 Booster Vaccination 09/11/2020   Moderna Sars-Covid-2 Vaccination 10/29/2019, 11/26/2019, 09/11/2020, 03/09/2021, 07/27/2021   Pneumococcal Conjugate-13 03/03/2020   Pneumococcal Polysaccharide-23 03/09/2021   Tdap 03/03/2020   Zoster Recombinant(Shingrix) 10/22/2019, 12/21/2019    TDAP status: Up to date  Flu Vaccine status: Up to date  Pneumococcal vaccine status: Up to date  Covid-19 vaccine status: Information provided on how to obtain vaccines.   Qualifies for Shingles Vaccine? Yes   Zostavax completed Yes   Shingrix Completed?: Yes  Screening Tests Health Maintenance  Topic Date Due   COVID-19 Vaccine (5 - 2023-24 season) 06/03/2022   INFLUENZA VACCINE  05/04/2023   Lung Cancer Screening  10/05/2023   Medicare Annual Wellness (AWV)  04/12/2024   MAMMOGRAM  03/01/2025   DTaP/Tdap/Td (2 - Td or Tdap) 03/03/2030    Colonoscopy  11/27/2031   Pneumonia Vaccine 29+ Years old  Completed   DEXA SCAN  Completed   Hepatitis C Screening  Completed   Zoster Vaccines- Shingrix  Completed   HPV VACCINES  Aged Out    Health Maintenance  Health Maintenance Due  Topic Date Due   COVID-19 Vaccine (5 - 2023-24 season) 06/03/2022    Colorectal cancer screening: Type of screening: Colonoscopy. Completed 11/26/2021. Repeat every 10 years  Mammogram status: Completed 03/02/2023. Repeat every year  Bone Density status: scheduled for 10/02/2023  Lung Cancer Screening: (Low Dose CT Chest recommended if Age 81-80 years, 20 pack-year currently smoking OR have quit w/in 15years.) does qualify.   Lung Cancer Screening Referral: CT scan 10/04/2022  Additional Screening:  Hepatitis C Screening: does qualify; Completed 06/26/2019  Vision Screening: Recommended annual ophthalmology exams for early detection of glaucoma and other disorders of the eye. Is the patient up to date with their annual eye exam?  Yes  Who is the  provider or what is the name of the office in which the patient attends annual eye exams? [facility] If pt is not established with a provider, would they like to be referred to a provider to establish care? No .   Dental Screening: Recommended annual dental exams for proper oral hygiene  Diabetic Foot Exam: n/a  Community Resource Referral / Chronic Care Management: CRR required this visit?  No   CCM required this visit?  No     Plan:     I have personally reviewed and noted the following in the patient's chart:   Medical and social history Use of alcohol, tobacco or illicit drugs  Current medications and supplements including opioid prescriptions. Patient is not currently taking opioid prescriptions. Functional ability and status Nutritional status Physical activity Advanced directives List of other physicians Hospitalizations, surgeries, and ER visits in previous 12  months Vitals Screenings to include cognitive, depression, and falls Referrals and appointments  In addition, I have reviewed and discussed with patient certain preventive protocols, quality metrics, and best practice recommendations. A written personalized care plan for preventive services as well as general preventive health recommendations were provided to patient.     Barb Merino, LPN   [id number]   After Visit Summary: in person  Nurse Notes: none

## 2023-04-13 NOTE — Patient Instructions (Signed)
Hypertension, Adult Hypertension is another name for high blood pressure. High blood pressure forces your heart to work harder to pump blood. This can cause problems over time. There are two numbers in a blood pressure reading. There is a top number (systolic) over a bottom number (diastolic). It is best to have a blood pressure that is below 120/80. What are the causes? The cause of this condition is not known. Some other conditions can lead to high blood pressure. What increases the risk? Some lifestyle factors can make you more likely to develop high blood pressure: Smoking. Not getting enough exercise or physical activity. Being overweight. Having too much fat, sugar, calories, or salt (sodium) in your diet. Drinking too much alcohol. Other risk factors include: Having any of these conditions: Heart disease. Diabetes. High cholesterol. Kidney disease. Obstructive sleep apnea. Having a family history of high blood pressure and high cholesterol. Age. The risk increases with age. Stress. What are the signs or symptoms? High blood pressure may not cause symptoms. Very high blood pressure (hypertensive crisis) may cause: Headache. Fast or uneven heartbeats (palpitations). Shortness of breath. Nosebleed. Vomiting or feeling like you may vomit (nauseous). Changes in how you see. Very bad chest pain. Feeling dizzy. Seizures. How is this treated? This condition is treated by making healthy lifestyle changes, such as: Eating healthy foods. Exercising more. Drinking less alcohol. Your doctor may prescribe medicine if lifestyle changes do not help enough and if: Your top number is above 130. Your bottom number is above 80. Your personal target blood pressure may vary. Follow these instructions at home: Eating and drinking  If told, follow the DASH eating plan. To follow this plan: Fill one half of your plate at each meal with fruits and vegetables. Fill one fourth of your plate  at each meal with whole grains. Whole grains include whole-wheat pasta, brown rice, and whole-grain bread. Eat or drink low-fat dairy products, such as skim milk or low-fat yogurt. Fill one fourth of your plate at each meal with low-fat (lean) proteins. Low-fat proteins include fish, chicken without skin, eggs, beans, and tofu. Avoid fatty meat, cured and processed meat, or chicken with skin. Avoid pre-made or processed food. Limit the amount of salt in your diet to less than 1,500 mg each day. Do not drink alcohol if: Your doctor tells you not to drink. You are pregnant, may be pregnant, or are planning to become pregnant. If you drink alcohol: Limit how much you have to: 0-1 drink a day for women. 0-2 drinks a day for men. Know how much alcohol is in your drink. In the [place] one drink equals one 12 oz bottle of beer (355 mL), one 5 oz glass of wine (148 mL), or one 1 oz glass of hard liquor (44 mL). Lifestyle  Work with your doctor to stay at a healthy weight or to lose weight. Ask your doctor what the best weight is for you. Get at least 30 minutes of exercise that causes your heart to beat faster (aerobic exercise) most days of the week. This may include walking, swimming, or biking. Get at least 30 minutes of exercise that strengthens your muscles (resistance exercise) at least 3 days a week. This may include lifting weights or doing Pilates. Do not smoke or use any products that contain nicotine or tobacco. If you need help quitting, ask your doctor. Check your blood pressure at home as told by your doctor. Keep all follow-up visits. Medicines Take over-the-counter and prescription medicines   only as told by your doctor. Follow directions carefully. Do not skip doses of blood pressure medicine. The medicine does not work as well if you skip doses. Skipping doses also puts you at risk for problems. Ask your doctor about side effects or reactions to medicines that you should watch  for. Contact a doctor if: You think you are having a reaction to the medicine you are taking. You have headaches that keep coming back. You feel dizzy. You have swelling in your ankles. You have trouble with your vision. Get help right away if: You get a very bad headache. You start to feel mixed up (confused). You feel weak or numb. You feel faint. You have very bad pain in your: Chest. Belly (abdomen). You vomit more than once. You have trouble breathing. These symptoms may be an emergency. Get help right away. Call 911. Do not wait to see if the symptoms will go away. Do not drive yourself to the hospital. Summary Hypertension is another name for high blood pressure. High blood pressure forces your heart to work harder to pump blood. For most people, a normal blood pressure is less than 120/80. Making healthy choices can help lower blood pressure. If your blood pressure does not get lower with healthy choices, you may need to take medicine. This information is not intended to replace advice given to you by your health care provider. Make sure you discuss any questions you have with your health care provider. Document Revised: 07/08/2021 Document Reviewed: 07/08/2021 Elsevier Patient Education  2024 [employer]  

## 2023-04-13 NOTE — Patient Instructions (Signed)
Maureen Rodgers , Thank you for taking time to come for your Medicare Wellness Visit. I appreciate your ongoing commitment to your health goals. Please review the following plan we discussed and let me know if I can assist you in the future.   These are the goals we discussed:  Goals       Grief - loss of close friends      Care Coordination Interventions: Evaluation of current treatment plan related to grief and patient's adherence to plan as established by provider Determined patient has lost several of her closet friends close together, she is feeling down and is grieving  Educated patient regarding availability of [facility] LCSW for grief counseling, patient declines at this time and is currently established with outpatient counseling Instructed patient to notify her doctor promptly if her symptoms worsen or she needs additional grief counseling  Active listening / Reflection utilized  Emotional Support Provided          Patient Stated (pt-stated)      Wants to stop drinking and to gain weight      Patient Stated      02/12/2020, would like depression and anxiety to get better, quit smoking      Patient Stated      02/24/2021, quit smoking, find out about stinging sensation      Patient Stated      03/24/2022, keep blood pressure under control      Patient Stated      04/13/2023, wants to quit smoking and drinking      To monitor BP at home as directed      Care Coordination Interventions: Evaluation of current treatment plan related to hypertension self management and patient's adherence to plan as established by provider Reviewed medications with patient and determined patient is currently out of her Amlodipine, educated patient on dangers of missing doses of BP med due to risk of rebound hypertension and she verbalizes understanding  Discussed her pharmacy did not auto-refill and was unable to obtain a refill on patient's behalf Determined patient was started on Amlodipine following  her last hospitalization, reviewed and discussed with patient Dr. Duke Salvia her Cardiologist should okay refill as patient was last seen by this MD in March Provided patient the contact number for Dr. Duke Salvia and discussed patient will call this provider now and explain her circumstances and request a refill  Advised patient, providing education and rationale, to monitor blood pressure daily and record, calling PCP for findings outside established parameters Discussed complications of poorly controlled blood pressure such as heart disease, stroke, circulatory complications, vision complications, kidney impairment, sexual dysfunction Screening for signs and symptoms of depression related to chronic disease state  Assessed social determinant of health barriers Reviewed scheduled/upcoming provider appointments including: next PCP follow up with Dr. Allyne Gee scheduled for 04/13/23 @11 :00 AM AWV/11:40 am with Dr. Allyne Gee          To overcome acute fatigue and return to baseline      Care Coordination Interventions: Evaluation of current treatment plan related to fatigue and patient's adherence to plan as established by provider Discussed with patient she feels her fatigue may be secondary to depression  Educated patient on the benefits of implementing a routine exercise program to help conquer fatigue, improve mental health and help patient get socialization outside of her circle/home Provided verbal and printed educational materials to patient regarding the PREP program and patient will consider Determined patient  has started drinking beer again after being sober  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 Allyne Gee well informed of her condition related to depression and alcohol use  Discussed plans with patient for ongoing care coordination follow up and provided patient with direct contact information for this RN Care Coordinator          This is a list of the screening recommended for you and due dates:  Health Maintenance  Topic Date Due   COVID-19 Vaccine (5 - 2023-24 season) 06/03/2022   Flu Shot  05/04/2023   Screening for Lung Cancer  10/05/2023   Medicare Annual Wellness Visit  04/12/2024   Mammogram  03/01/2025   DTaP/Tdap/Td vaccine (2 - Td or Tdap) 03/03/2030   Colon Cancer Screening  11/27/2031   Pneumonia Vaccine  Completed   DEXA scan (bone density measurement)  Completed   Hepatitis C Screening  Completed   Zoster (Shingles) Vaccine  Completed   HPV Vaccine  Aged Out    Advanced directives: Advance directive discussed with you today. Even though you declined this today please call our office should you change your mind and we can give you the proper paperwork for you to fill out.  Conditions/risks identified: smoking  Next appointment: Follow up in one year for your annual wellness visit    Preventive Care 72 Years and Older, Female Preventive care refers to lifestyle choices and visits with your health care provider that can promote health and wellness. What does preventive care include? A yearly physical exam. This is also called an annual well check. Dental exams once or twice a year. Routine eye exams. Ask your health care provider how often you should have your eyes checked. Personal lifestyle choices, including: Daily care of your teeth and gums. Regular physical activity. Eating a healthy diet. Avoiding tobacco and drug use. Limiting alcohol use. Practicing safe sex. Taking low-dose aspirin every day. Taking vitamin  and mineral supplements as recommended by your health care provider. What happens during an annual well check? The services and screenings done by your health care provider during your annual well check will depend on your age, overall health, lifestyle risk factors, and family history of disease. Counseling  Your health care provider may ask you questions about your: Alcohol use. Tobacco use. Drug use. Emotional well-being. Home and relationship well-being. Sexual activity. Eating habits. History of falls. Memory and ability to understand (cognition). Work and work Astronomer. Reproductive health. Screening  You may have the following tests or measurements: Height, weight, and BMI. Blood pressure. Lipid and cholesterol levels. These may be checked every 5 years, or more frequently if you are over 62 years old. Skin check. Lung cancer screening. You may have this screening every year starting at age 29 if you have a 30-pack-year history of smoking and currently smoke or have quit within the past 15 years. Fecal occult blood test (FOBT) of the stool. You may have this test every year starting at age 24. Flexible sigmoidoscopy or colonoscopy. You may have a sigmoidoscopy every 5 years or a colonoscopy every 10 years starting at age 75. Hepatitis C blood test. Hepatitis B blood  test. Sexually transmitted disease (STD) testing. Diabetes screening. This is done by checking your blood sugar (glucose) after you have not eaten for a while (fasting). You may have this done every 1-3 years. Bone density scan. This is done to screen for osteoporosis. You may have this done starting at age 53. Mammogram. This may be done every 1-2 years. Talk to your health care provider about how often you should have regular mammograms. Talk with your health care provider about your test results, treatment options, and if necessary, the need for more tests. Vaccines  Your health care provider may recommend  certain vaccines, such as: Influenza vaccine. This is recommended every year. Tetanus, diphtheria, and acellular pertussis (Tdap, Td) vaccine. You may need a Td booster every 10 years. Zoster vaccine. You may need this after age 85. Pneumococcal 13-valent conjugate (PCV13) vaccine. One dose is recommended after age 49. Pneumococcal polysaccharide (PPSV23) vaccine. One dose is recommended after age 9. Talk to your health care provider about which screenings and vaccines you need and how often you need them. This information is not intended to replace advice given to you by your health care provider. Make sure you discuss any questions you have with your health care provider. Document Released: 10/16/2015 Document Revised: 06/08/2016 Document Reviewed: 07/21/2015 Elsevier Interactive Patient Education  2017 [employer].  Fall Prevention in the Home Falls can cause injuries. They can happen to people of all ages. There are many things you can do to make your home safe and to help prevent falls. What can I do on the outside of my home? Regularly fix the edges of walkways and driveways and fix any cracks. Remove anything that might make you trip as you walk through a door, such as a raised step or threshold. Trim any bushes or trees on the path to your home. Use bright outdoor lighting. Clear any walking paths of anything that might make someone trip, such as rocks or tools. Regularly check to see if handrails are loose or broken. Make sure that both sides of any steps have handrails. Any raised decks and porches should have guardrails on the edges. Have any leaves, snow, or ice cleared regularly. Use sand or salt on walking paths during winter. Clean up any spills in your garage right away. This includes oil or grease spills. What can I do in the bathroom? Use night lights. Install grab bars by the toilet and in the tub and shower. Do not use towel bars as grab bars. Use non-skid mats or  decals in the tub or shower. If you need to sit down in the shower, use a plastic, non-slip stool. Keep the floor dry. Clean up any water that spills on the floor as soon as it happens. Remove soap buildup in the tub or shower regularly. Attach bath mats securely with double-sided non-slip rug tape. Do not have throw rugs and other things on the floor that can make you trip. What can I do in the bedroom? Use night lights. Make sure that you have a light by your bed that is easy to reach. Do not use any sheets or blankets that are too big for your bed. They should not hang down onto the floor. Have a firm chair that has side arms. You can use this for support while you get dressed. Do not have throw rugs and other things on the floor that can make you trip. What can I do in the kitchen? Clean up any spills right  away. Avoid walking on wet floors. Keep items that you use a lot in easy-to-reach places. If you need to reach something above you, use a strong step stool that has a grab bar. Keep electrical cords out of the way. Do not use floor polish or wax that makes floors slippery. If you must use wax, use non-skid floor wax. Do not have throw rugs and other things on the floor that can make you trip. What can I do with my stairs? Do not leave any items on the stairs. Make sure that there are handrails on both sides of the stairs and use them. Fix handrails that are broken or loose. Make sure that handrails are as long as the stairways. Check any carpeting to make sure that it is firmly attached to the stairs. Fix any carpet that is loose or worn. Avoid having throw rugs at the top or bottom of the stairs. If you do have throw rugs, attach them to the floor with carpet tape. Make sure that you have a light switch at the top of the stairs and the bottom of the stairs. If you do not have them, ask someone to add them for you. What else can I do to help prevent falls? Wear shoes that: Do not  have high heels. Have rubber bottoms. Are comfortable and fit you well. Are closed at the toe. Do not wear sandals. If you use a stepladder: Make sure that it is fully opened. Do not climb a closed stepladder. Make sure that both sides of the stepladder are locked into place. Ask someone to hold it for you, if possible. Clearly mark and make sure that you can see: Any grab bars or handrails. First and last steps. Where the edge of each step is. Use tools that help you move around (mobility aids) if they are needed. These include: Canes. Walkers. Scooters. Crutches. Turn on the lights when you go into a dark area. Replace any light bulbs as soon as they burn out. Set up your furniture so you have a clear path. Avoid moving your furniture around. If any of your floors are uneven, fix them. If there are any pets around you, be aware of where they are. Review your medicines with your doctor. Some medicines can make you feel dizzy. This can increase your chance of falling. Ask your doctor what other things that you can do to help prevent falls. This information is not intended to replace advice given to you by your health care provider. Make sure you discuss any questions you have with your health care provider. Document Released: 07/16/2009 Document Revised: 02/25/2016 Document Reviewed: 10/24/2014 Elsevier Interactive Patient Education  2017 [employer].

## 2023-04-13 NOTE — Progress Notes (Signed)
I,Victoria T Deloria Lair, CMA,acting as a Neurosurgeon for Gwynneth Aliment, MD.,have documented all relevant documentation on the behalf of Gwynneth Aliment, MD,as directed by  Gwynneth Aliment, MD while in the presence of Gwynneth Aliment, MD.  Subjective:  Patient ID: Maureen Rodgers , female    DOB: Nov 12, 1950 , 72 y.o.   MRN: [id number]  Chief Complaint  Patient presents with   Hypertension   Hyperlipidemia    HPI  Patient presents today for bp, iron & cholesterol follow up. She reports compliance with medications. Denies headache, chest pain, and SOB.   AWV completed with [facility] Advisor: Nickeah.    Hypertension This is a chronic problem. The current episode started more than 1 year ago. The problem has been gradually improving since onset. The problem is controlled. Risk factors for coronary artery disease include post-menopausal state, smoking/tobacco exposure and dyslipidemia. Past treatments include calcium channel blockers. The current treatment provides moderate improvement. Compliance problems include exercise.      Past Medical History:  Diagnosis Date   Ascending aortic aneurysm (HCC) 11/29/2021   Depression    Essential hypertension 11/29/2021   GERD (gastroesophageal reflux disease)    Tobacco use      Family History  Problem Relation Age of Onset   Heart disease Mother    Dementia Mother    Alcohol abuse Mother    Alcohol abuse Father    Heart disease Brother    Diabetes Brother    Heart attack Maternal Grandmother      Current Outpatient Medications:    alendronate (FOSAMAX) 70 MG tablet, TAKE 1 TABLET (70 MG TOTAL) BY MOUTH EVERY SUNDAY., Disp: 12 tablet, Rfl: 2   ALPRAZolam (XANAX) 1 MG tablet, Take 1 mg by mouth 3 (three) times daily as needed for anxiety or sleep., Disp: , Rfl:    amLODipine (NORVASC) 10 MG tablet, Take 1 tablet (10 mg total) by mouth daily., Disp: 90 tablet, Rfl: 3   Calcium Carbonate-Vit D-Min (CALCIUM 1200 PO), Take 1 tablet by mouth daily.,  Disp: , Rfl:    Carboxymethylcellulose Sodium (EYE DROPS OP), Place 1 drop into both eyes 3 (three) times daily as needed (dry eyes)., Disp: , Rfl:    DULoxetine (CYMBALTA) 60 MG capsule, Take 60 mg by mouth at bedtime., Disp: , Rfl:    esomeprazole (NEXIUM) 40 MG capsule, Take 40 mg by mouth daily at 12 noon., Disp: , Rfl:    fluticasone (FLONASE) 50 MCG/ACT nasal spray, Place 1 spray into both nostrils daily. (Patient taking differently: Place 1 spray into both nostrils daily as needed for allergies.), Disp: 16 g, Rfl: 5   Iron-FA-B Cmp-C-Biot-Probiotic (FUSION PLUS) CAPS, TAKE 1 CAPSULE BY MOUTH MONDAY THROUGH FRIDAYS, Disp: 30 capsule, Rfl: 3   Magnesium 400 MG TABS, Take 1 tablet by mouth daily., Disp: , Rfl:    Omega-3 Fatty Acids (FISH OIL) 1000 MG CAPS, Take 1,000 mg by mouth daily. , Disp: , Rfl:    traZODone (DESYREL) 100 MG tablet, Take 100 mg by mouth at bedtime., Disp: , Rfl:    atorvastatin (LIPITOR) 10 MG tablet, TAKE 1 TABLET DAILY ON MON, WED, FRI., Disp: 90 tablet, Rfl: 1   No Known Allergies   Review of Systems  Constitutional: Negative.   Respiratory: Negative.    Cardiovascular: Negative.   Neurological: Negative.   Psychiatric/Behavioral: Negative.       Today's Vitals   04/13/23 1049  BP: 108/76  Pulse: 81  Temp: 98 F (  36.7 C)  SpO2: 98%  Weight: 124 lb (56.2 kg)  Height: 5\' 2"  (1.575 m)   Body mass index is 22.68 kg/m.  Wt Readings from Last 3 Encounters:  04/13/23 124 lb (56.2 kg)  04/13/23 124 lb (56.2 kg)  12/05/22 113 lb 4.8 oz (51.4 kg)     Objective:  Physical Exam Vitals and nursing note reviewed.  Constitutional:      Appearance: Normal appearance.  HENT:     Head: Normocephalic and atraumatic.  Eyes:     Extraocular Movements: Extraocular movements intact.  Cardiovascular:     Rate and Rhythm: Normal rate and regular rhythm.     Heart sounds: Normal heart sounds.  Pulmonary:     Effort: Pulmonary effort is normal.     Breath  sounds: Normal breath sounds.  Musculoskeletal:     Cervical back: Normal range of motion.  Skin:    General: Skin is warm.  Neurological:     General: No focal deficit present.     Mental Status: She is alert.  Psychiatric:        Mood and Affect: Mood normal.        Behavior: Behavior normal.         Assessment And Plan:  Hypertensive heart disease without heart failure Assessment & Plan: Chronic, well controlled. She will c/w amlodipine 10mg  daily. She is encouraged to follow low sodium diet.  She will f/u in six months for re-evaluation.   Orders: -     CMP14+EGFR -     Lipid panel -     Amb Referral To Provider Referral Exercise Program (P.R.E.P) -     TSH  Coronary artery disease involving native coronary artery of native heart without angina pectoris Assessment & Plan: Chronic, LDL goal < 70.  She will c/w atorvastatin 10mg  daily. She is encouraged to follow heart healthy lifestyle.    Atherosclerosis of aorta (HCC) Assessment & Plan: Chronic, LDL goal < 70.  She will c/w atorvastatin 10mg  daily. She is encouraged to follow heart healthy lifestyle.   Orders: -     Amb Referral To Provider Referral Exercise Program (P.R.E.P)  Moderate episode of recurrent major depressive disorder (HCC) Assessment & Plan: Chronic, sx are stable with use of duloxetine 60mg  daily. Importance of medication compliance was discussed with the patient.   Orders: -     TSH  Cigarette nicotine dependence without complication Assessment & Plan: She has greater than 30 pack year history of tobacco use. Her last LDCT was in January 2024, next one due January 2025.   Body mass index (BMI) of 22.0-22.9 in adult Assessment & Plan: She is encouraged to exercise at least 150 minutes of exercise/week. She agrees to [name] referral.       Return for 6 month bp, 1 year AWV w thn and OV w/ RS.  Patient was given opportunity to ask questions. Patient verbalized understanding of the plan and  was able to repeat key elements of the plan. All questions were answered to their satisfaction.   I, Gwynneth Aliment, MD, have reviewed all documentation for this visit. The documentation on 04/13/23 for the exam, diagnosis, procedures, and orders are all accurate and complete.   IF YOU HAVE BEEN REFERRED TO A SPECIALIST, IT MAY TAKE 1-2 WEEKS TO SCHEDULE/PROCESS THE REFERRAL. IF YOU HAVE NOT HEARD FROM US/SPECIALIST IN TWO WEEKS, PLEASE GIVE [place] A CALL AT [phone].   THE PATIENT IS ENCOURAGED TO PRACTICE SOCIAL  DISTANCING DUE TO THE COVID-19 PANDEMIC.

## 2023-04-14 ENCOUNTER — Telehealth: Payer: Self-pay

## 2023-04-14 LAB — LIPID PANEL
Chol/HDL Ratio: 2 ratio (ref 0.0–4.4)
Cholesterol, Total: 187 mg/dL (ref 100–199)
HDL: 95 mg/dL (ref >39)
LDL Chol Calc (NIH): 82 mg/dL (ref 0–99)
Triglycerides: 50 mg/dL (ref 0–149)
VLDL Cholesterol Cal: 10 mg/dL (ref 5–40)

## 2023-04-14 LAB — CMP14+EGFR
ALT: 13 IU/L (ref 0–32)
AST: 20 IU/L (ref 0–40)
Albumin: 4.1 g/dL (ref 3.8–4.8)
Alkaline Phosphatase: 65 IU/L (ref 44–121)
BUN/Creatinine Ratio: 12 (ref 12–28)
BUN: 9 mg/dL (ref 8–27)
Bilirubin Total: 0.2 mg/dL (ref 0.0–1.2)
CO2: 26 mmol/L (ref 20–29)
Calcium: 9.5 mg/dL (ref 8.7–10.3)
Chloride: 100 mmol/L (ref 96–106)
Creatinine, Ser: 0.76 mg/dL (ref 0.57–1.00)
Globulin, Total: 2.5 g/dL (ref 1.5–4.5)
Glucose: 149 mg/dL — ABNORMAL HIGH (ref 70–99)
Potassium: 4.2 mmol/L (ref 3.5–5.2)
Sodium: 139 mmol/L (ref 134–144)
Total Protein: 6.6 g/dL (ref 6.0–8.5)
eGFR: 83 mL/min/{1.73_m2} (ref >59)

## 2023-04-14 LAB — TSH: TSH: 1.26 u[IU]/mL (ref 0.450–4.500)

## 2023-04-14 NOTE — Telephone Encounter (Signed)
VMT pt requesting call back to discuss starting PREP 

## 2023-04-17 ENCOUNTER — Ambulatory Visit: Payer: Self-pay

## 2023-04-17 NOTE — Patient Instructions (Signed)
Visit Information  Thank you for taking time to visit with me today. Please don't hesitate to contact me if I can be of assistance to you.   Following are the goals we discussed today:   Goals Addressed             This Visit's Progress    To monitor BP at home as directed       Care Coordination Interventions: Evaluation of current treatment plan related to hypertension self management and patient's adherence to plan as established by provider Review of patient status, including review of consultant's reports, relevant laboratory and other test results, and medications completed Reviewed next scheduled Cardiology follow up with Dr. Duke Salvia for a 6 mth follow up set for 06/07/23 @11 :20 AM Last practice recorded BP readings:  BP Readings from Last 3 Encounters:  04/13/23 108/76  04/13/23 108/76  12/05/22 124/76   Most recent eGFR/CrCl:  Lab Results  Component Value Date   EGFR 83 04/13/2023    No components found for: "CRCL"           COMPLETED: To overcome acute fatigue and return to baseline       Care Coordination Interventions: Evaluation of current treatment plan related to fatigue and patient's adherence to plan as established by provider Determined patient feels her fatigue has improved since last nurse contact  Reviewed and discussed medication regimen for patient's prescribed iron regimen, and discussed the importance of medication adherence Reviewed and discussed with the patient, PCP referral for PREP, further educated patient about the program and next steps Determined patient has gain weight due to striving to take in more calories, she has a good support system through her friend group Positive reinforcement given to patient for making efforts to improve her overall health Instructed patient to keep her doctor informed of new symptoms or concerns         Our next appointment is by telephone on 06/09/23 at 12:00 PM  Please call the care guide team at  [phone] if you need to cancel or reschedule your appointment.   If you are experiencing a Mental Health or Behavioral Health Crisis or need someone to talk to, please call 1-800-273-TALK (toll free, 24 hour hotline)  Patient verbalizes understanding of instructions and care plan provided today and agrees to view in MyChart. Active MyChart status and patient understanding of how to access instructions and care plan via MyChart confirmed with patient.     Delsa Sale, RN, BSN, CCM Care Management Coordinator [facility] Care Management  Direct Phone: [phone]

## 2023-04-17 NOTE — Patient Outreach (Signed)
  Care Coordination   Follow Up Visit Note   04/17/2023 Name: Maureen Rodgers MRN: [id number] DOB: 15-Jan-1951  Maureen Rodgers is a 72 y.o. year old female who sees Dorothyann Peng, MD for primary care. I spoke with  Maureen Rodgers by phone today.  What matters to the patients health and wellness today?  Patient would like to participate in the PREP program and continue to maintain a healthy BMI.     Goals Addressed             This Visit's Progress    To monitor BP at home as directed       Care Coordination Interventions: Evaluation of current treatment plan related to hypertension self management and patient's adherence to plan as established by provider Review of patient status, including review of consultant's reports, relevant laboratory and other test results, and medications completed Reviewed next scheduled Cardiology follow up with Dr. Duke Salvia for a 6 mth follow up set for 06/07/23 @11 :20 AM Last practice recorded BP readings:  BP Readings from Last 3 Encounters:  04/13/23 108/76  04/13/23 108/76  12/05/22 124/76   Most recent eGFR/CrCl:  Lab Results  Component Value Date   EGFR 83 04/13/2023    No components found for: "CRCL"      COMPLETED: To overcome acute fatigue and return to baseline       Care Coordination Interventions: Evaluation of current treatment plan related to fatigue and patient's adherence to plan as established by provider Determined patient feels her fatigue has improved since last nurse contact  Reviewed and discussed medication regimen for patient's prescribed iron regimen, and discussed the importance of medication adherence Reviewed and discussed with the patient, PCP referral for PREP, further educated patient about the program and next steps Determined patient has gain weight due to striving to take in more calories, she has a good support system through her friend group Positive reinforcement given to patient for making efforts to  improve her overall health Instructed patient to keep her doctor informed of new symptoms or concerns     Interventions Today    Flowsheet Row Most Recent Value  Chronic Disease   Chronic disease during today's visit Hypertension (HTN), Other  Armanda Magic,  Osteoporosis]  General Interventions   General Interventions Discussed/Reviewed General Interventions Discussed, General Interventions Reviewed, Doctor Visits, Labs  Doctor Visits Discussed/Reviewed PCP, Doctor Visits Reviewed, Doctor Visits Discussed  Exercise Interventions   Exercise Discussed/Reviewed Exercise Reviewed, Exercise Discussed, Physical Activity  Physical Activity Discussed/Reviewed Physical Activity Reviewed, Physical Activity Discussed, PREP, Types of exercise  Education Interventions   Education Provided Provided Education  Provided Verbal Education On Nutrition, Labs, When to see the doctor, Exercise, Mental Health/Coping with Illness, Medication  Mental Health Interventions   Mental Health Discussed/Reviewed Mental Health Discussed, Mental Health Reviewed, Coping Strategies, Depression  Nutrition Interventions   Nutrition Discussed/Reviewed Nutrition Discussed, Nutrition Reviewed  Pharmacy Interventions   Pharmacy Dicussed/Reviewed Pharmacy Topics Discussed, Pharmacy Topics Reviewed, Medications and their functions          SDOH assessments and interventions completed:  No     Care Coordination Interventions:  Yes, provided   Follow up plan: Follow up call scheduled for 06/09/23 @12 :00 PM    Encounter Outcome:  Pt. Visit Completed

## 2023-04-18 ENCOUNTER — Other Ambulatory Visit: Payer: Self-pay

## 2023-04-18 DIAGNOSIS — I2510 Atherosclerotic heart disease of native coronary artery without angina pectoris: Secondary | ICD-10-CM

## 2023-04-18 MED ORDER — ATORVASTATIN CALCIUM 10 MG PO TABS
ORAL_TABLET | ORAL | 1 refills | Status: DC
Start: 2023-04-18 — End: 2023-06-07

## 2023-04-28 DIAGNOSIS — F411 Generalized anxiety disorder: Secondary | ICD-10-CM | POA: Diagnosis not present

## 2023-04-29 DIAGNOSIS — Z6822 Body mass index (BMI) 22.0-22.9, adult: Secondary | ICD-10-CM | POA: Insufficient documentation

## 2023-04-29 DIAGNOSIS — I2510 Atherosclerotic heart disease of native coronary artery without angina pectoris: Secondary | ICD-10-CM | POA: Insufficient documentation

## 2023-04-29 NOTE — Assessment & Plan Note (Signed)
She has greater than 30 pack year history of tobacco use. Her last LDCT was in January 2024, next one due January 2025.

## 2023-04-29 NOTE — Assessment & Plan Note (Addendum)
Chronic, well controlled. She will c/w amlodipine 10mg  daily. She is encouraged to follow low sodium diet.  She will f/u in six months for re-evaluation.

## 2023-04-29 NOTE — Assessment & Plan Note (Signed)
Chronic, sx are stable with use of duloxetine 60mg  daily. Importance of medication compliance was discussed with the patient.

## 2023-04-29 NOTE — Assessment & Plan Note (Addendum)
Chronic, LDL goal < 70.  She will c/w atorvastatin 10mg  daily. She is encouraged to follow heart healthy lifestyle.

## 2023-04-29 NOTE — Assessment & Plan Note (Signed)
She is encouraged to exercise at least 150 minutes of exercise/week. She agrees to [name] referral.

## 2023-04-29 NOTE — Assessment & Plan Note (Signed)
Chronic, LDL goal < 70.  She will c/w atorvastatin 10mg  daily. She is encouraged to follow heart healthy lifestyle.

## 2023-05-26 DIAGNOSIS — F411 Generalized anxiety disorder: Secondary | ICD-10-CM | POA: Diagnosis not present

## 2023-06-07 ENCOUNTER — Encounter (HOSPITAL_BASED_OUTPATIENT_CLINIC_OR_DEPARTMENT_OTHER): Payer: Self-pay | Admitting: Cardiovascular Disease

## 2023-06-07 ENCOUNTER — Ambulatory Visit (HOSPITAL_BASED_OUTPATIENT_CLINIC_OR_DEPARTMENT_OTHER): Payer: Medicare Other | Admitting: Cardiovascular Disease

## 2023-06-07 VITALS — BP 130/79 | HR 79 | Ht 62.0 in | Wt 127.2 lb

## 2023-06-07 DIAGNOSIS — Z5181 Encounter for therapeutic drug level monitoring: Secondary | ICD-10-CM

## 2023-06-07 DIAGNOSIS — I2510 Atherosclerotic heart disease of native coronary artery without angina pectoris: Secondary | ICD-10-CM | POA: Diagnosis not present

## 2023-06-07 DIAGNOSIS — F419 Anxiety disorder, unspecified: Secondary | ICD-10-CM | POA: Diagnosis not present

## 2023-06-07 DIAGNOSIS — I10 Essential (primary) hypertension: Secondary | ICD-10-CM

## 2023-06-07 DIAGNOSIS — E782 Mixed hyperlipidemia: Secondary | ICD-10-CM | POA: Diagnosis not present

## 2023-06-07 HISTORY — DX: Anxiety disorder, unspecified: F41.9

## 2023-06-07 MED ORDER — ATORVASTATIN CALCIUM 10 MG PO TABS: 10.0000 mg | ORAL_TABLET | Freq: Every day | ORAL | 1 refills | Status: DC

## 2023-06-07 NOTE — Patient Instructions (Signed)
Medication Instructions:  INCREASE ATORVASTATIN TO DAILY   *If you need a refill on your cardiac medications before your next appointment, please call your pharmacy*  Lab Work: FASTING LP/CMET IN 2-3 MONTHS   If you have labs (blood work) drawn today and your tests are completely normal, you will receive your results only by: MyChart Message (if you have MyChart) OR A paper copy in the mail If you have any lab test that is abnormal or we need to change your treatment, we will call you to review the results.  Testing/Procedures: NONE  Follow-Up: At [facility], you and your health needs are our priority.  As part of our continuing mission to provide you with exceptional heart care, we have created designated Provider Care Teams.  These Care Teams include your primary Cardiologist (physician) and Advanced Practice Providers (APPs -  Physician Assistants and Nurse Practitioners) who all work together to provide you with the care you need, when you need it.  We recommend signing up for the patient portal called "MyChart".  Sign up information is provided on this After Visit Summary.  MyChart is used to connect with patients for Virtual Visits (Telemedicine).  Patients are able to view lab/test results, encounter notes, upcoming appointments, etc.  Non-urgent messages can be sent to your provider as well.   To learn more about what you can do with MyChart, go to [email or web address].    Your next appointment:   12 month(s)  Provider:   Chilton Si, MD or Gillian Shields, NP

## 2023-06-07 NOTE — Progress Notes (Signed)
Cardiology Office Note:  .   Date:  06/07/2023  ID:  Maureen Rodgers, DOB 1951/05/28, MRN [id number] PCP: Maureen Peng, MD  [facility] Providers Cardiologist:  Chilton Si, MD    History of Present Illness: .    Maureen Rodgers is a 72 y.o. female with aortic atherosclerosis, iron deficiency anemia, GERD, hyperlipidemia, and tobacco abuse who is being seen today for follow-up. She was initially seen 11/2021 for the evaluation of shortness of breath.  She saw Dr. Allyne Gee 08/2021 and reported shortness of breath.  It was noted that anemia may have been contributing.  She had an echo 12/22 that revealed LVEF 60 to 65% with mild LVH and grade 1 diastolic dysfunction.  She had mild posterior mitral valve leaflet prolapse with no regurgitation.  She had been struggling with depression.  She was in therapy but felt constantly tired.  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  She reported shortness of breath with exertion.  She smoked at least 0.5 ppd and started at age 48.  She also reported drinking 3 large beers daily.  She reported a history of alcoholism and was previously able to stop drinking for 6 years. Her blood pressure was elevated in the office but she wanted to work on diet and exercise. She was referred to the PREP program. She had a nuclear stress test 12/2021 that revealed LVEF 72% and no ischemia. She was seen in the hospital 11/2022 with diverticulitis. Cardiology was consulted for asymptomatic bradycardia to the 40's overnight. No intervention was recommended. She had an echo 09/2022 that revealed LVEF 60-65% with moderate LVH and indeterminate diastolic function. Her aorta was not dilated. An outpatient monitor was ordered but not performed. She had a chest CT 10/2022 and her aorta was reported at 4.1 cm. She was also noted to have aortic and coronary calcification.   At her appointment 12/2022 she was recovering from her  hospitalization for diverticulitis.  She was continually trying to cut back on smoking.  She was encouraged to work on diet and exercise in an effort to reduce her cholesterol.  Ms. Romm presents with ongoing struggles with depression and anxiety. She reports a long-standing history of these conditions, which have been poorly controlled despite medication. The patient has been prescribed Xanax, which she takes as needed when she feels anxious, but she has been considering increasing the frequency to three times a day due to persistent symptoms.  She also reports a lack of motivation and social isolation, which has led to a sedentary lifestyle. She lives alone and struggles with taking the first step towards engaging in activities or volunteering, despite encouragement from friends. She has been recommended to join the PREP program at the [name], but has yet to take action on this.  In addition to her mental health concerns, the patient continues to smoke cigarettes, with an increase in frequency recently, which she attributes to her lack of social interaction. She is under the care of a therapist and a physician, Dr. Allyne Gee, for her depression.  The patient also has a history of hyperlipidemia, currently managed with atorvastatin taken on Monday, Wednesday, and Friday. She has no reported side effects from this medication. Her latest cholesterol check showed a LDL of 82, slightly above the target of under 70.  Lastly, the patient mentions a previous issue with her rotator cuff, which had interrupted her participation in an exercise program a year or  so ago. She also notes some unsteadiness upon first waking in the morning, but no other issues with mobility or pain in her legs. Her blood pressure is managed with amlodipine and has been stable.   ROS:  N/a  Studies Reviewed: .       Echo 09/05/22:  1. Left ventricular ejection fraction, by estimation, is 60 to 65%. The  left ventricle has normal  function. The left ventricle has no regional  wall motion abnormalities. There is moderate concentric left ventricular  hypertrophy. Left ventricular  diastolic parameters are indeterminate.   2. Right ventricular systolic function is normal. The right ventricular  size is normal. There is normal pulmonary artery systolic pressure.   3. The mitral valve is grossly normal. Trivial mitral valve  regurgitation. No evidence of mitral stenosis.   4. The aortic valve is grossly normal. Aortic valve regurgitation is not  visualized. No aortic stenosis is present.   5. The inferior vena cava is normal in size with greater than 50%  respiratory variability, suggesting right atrial pressure of 3 mmHg.    Risk Assessment/Calculations:             Physical Exam:    VS:  BP 130/79 (BP Location: Left Arm, Patient Position: Sitting, Cuff Size: Normal)   Pulse 79   Ht 5\' 2"  (1.575 m)   Wt 127 lb 3.2 oz (57.7 kg)   SpO2 98%   BMI 23.27 kg/m  , BMI Body mass index is 23.27 kg/m. GENERAL:  Well appearing HEENT: Pupils equal round and reactive, fundi not visualized, oral mucosa unremarkable NECK:  No jugular venous distention, waveform within normal limits, carotid upstroke brisk and symmetric, no bruits, no thyromegaly LUNGS:  Clear to auscultation bilaterally HEART:  RRR.  PMI not displaced or sustained,S1 and S2 within normal limits, no S3, no S4, no clicks, no rubs, no murmurs ABD:  Flat, positive bowel sounds normal in frequency in pitch, no bruits, no rebound, no guarding, no midline pulsatile mass, no hepatomegaly, no splenomegaly EXT:  2 plus pulses throughout, no edema, no cyanosis no clubbing SKIN:  No rashes no nodules NEURO:  Cranial nerves II through XII grossly intact, motor grossly intact throughout PSYCH:  Cognitively intact, oriented to person place and time   ASSESSMENT AND PLAN: .    # Depression and Anxiety Severe, longstanding, and poorly controlled. Currently on Xanax as  needed. Discussed the need for more consistent psychiatric management and potential medication adjustments. -Recommend patient to work with psychiatrist for better control of symptoms.  Continue with Dr. Allyne Gee and her therapist. -Continue Cymbalta  # Physical Inactivity Patient isolates at home and lacks motivation for physical activity. Discussed the benefits of scheduled exercise programs. [facility]  # Hyperlipidemia LDL slightly above goal at 82. Currently on Atorvastatin 10mg  MWF. Discussed the benefits of daily dosing and lifestyle modifications. -Increase Atorvastatin to 10mg  daily. -Recheck lipid panel in mid-November.  # Hypertension Well controlled on Amlodipine. -Continue current regimen.  # Tobacco Use Increased smoking due to social isolation and uncontrolled depression. -Recommend working with psychiatrist and therapist to address underlying issues contributing to increased smoking.  Follow-up in 1 year or sooner if needed.             Dispo: 1 year  Signed, Chilton Si, MD

## 2023-06-09 ENCOUNTER — Ambulatory Visit: Payer: Self-pay

## 2023-06-09 DIAGNOSIS — F411 Generalized anxiety disorder: Secondary | ICD-10-CM | POA: Diagnosis not present

## 2023-06-09 DIAGNOSIS — F331 Major depressive disorder, recurrent, moderate: Secondary | ICD-10-CM | POA: Diagnosis not present

## 2023-06-09 NOTE — Patient Outreach (Signed)
  Care Coordination   06/09/2023 Name: Maureen Rodgers MRN: [id number] DOB: December 31, 1950   Care Coordination Outreach Attempts:  An unsuccessful telephone outreach was attempted for a scheduled appointment today.  Follow Up Plan:  Additional outreach attempts will be made to offer the patient care coordination information and services.   Encounter Outcome:  No Answer   Care Coordination Interventions:  No, not indicated    Delsa Sale, RN, BSN, CCM Care Management Coordinator [facility] Care Management Direct Phone: [phone]

## 2023-06-12 ENCOUNTER — Telehealth: Payer: Self-pay | Admitting: *Deleted

## 2023-06-12 NOTE — Progress Notes (Signed)
  Care Coordination Note  06/12/2023 Name: CHRISTANA TOLENTINO MRN: [id number] DOB: 03-25-1951  Maureen Rodgers is a 72 y.o. year old female who is a primary care patient of Dorothyann Peng, MD and is actively engaged with the care management team. I reached out to [name] by phone today to assist with re-scheduling a follow up visit with the RN Case Manager  Follow up plan: Unsuccessful telephone outreach attempt made. A HIPAA compliant phone message was left for the patient providing contact information and requesting a return call.   [facility]  Care Coordination Care Guide  Direct Dial: [phone]

## 2023-06-19 NOTE — Progress Notes (Signed)
Care Coordination Note  06/19/2023 Name: Maureen Rodgers MRN: [id number] DOB: Jul 21, 1951  Maureen Rodgers is a 72 y.o. year old female who is a primary care patient of Dorothyann Peng, MD and is actively engaged with the care management team. I reached out to [name] by phone today to assist with re-scheduling a follow up visit with the RN Case Manager  Follow up plan: Unsuccessful telephone outreach attempt made. A HIPAA compliant phone message was left for the patient providing contact information and requesting a return call. We have been unable to make contact with the patient for follow up. The care management team is available to follow up with the patient after provider conversation with the patient regarding recommendation for care management engagement and subsequent re-referral to the care management team.   [facility] Coordination Care Guide  Direct Dial: [phone]

## 2023-08-21 DIAGNOSIS — Z961 Presence of intraocular lens: Secondary | ICD-10-CM | POA: Diagnosis not present

## 2023-08-21 DIAGNOSIS — H0102A Squamous blepharitis right eye, upper and lower eyelids: Secondary | ICD-10-CM | POA: Diagnosis not present

## 2023-08-21 DIAGNOSIS — H0288B Meibomian gland dysfunction left eye, upper and lower eyelids: Secondary | ICD-10-CM | POA: Diagnosis not present

## 2023-08-21 DIAGNOSIS — H0102B Squamous blepharitis left eye, upper and lower eyelids: Secondary | ICD-10-CM | POA: Diagnosis not present

## 2023-08-21 DIAGNOSIS — R519 Headache, unspecified: Secondary | ICD-10-CM | POA: Diagnosis not present

## 2023-08-21 DIAGNOSIS — H04123 Dry eye syndrome of bilateral lacrimal glands: Secondary | ICD-10-CM | POA: Diagnosis not present

## 2023-08-21 DIAGNOSIS — H5711 Ocular pain, right eye: Secondary | ICD-10-CM | POA: Diagnosis not present

## 2023-08-21 DIAGNOSIS — H0288A Meibomian gland dysfunction right eye, upper and lower eyelids: Secondary | ICD-10-CM | POA: Diagnosis not present

## 2023-09-05 DIAGNOSIS — F411 Generalized anxiety disorder: Secondary | ICD-10-CM | POA: Diagnosis not present

## 2023-09-05 DIAGNOSIS — F331 Major depressive disorder, recurrent, moderate: Secondary | ICD-10-CM | POA: Diagnosis not present

## 2023-09-12 DIAGNOSIS — Z23 Encounter for immunization: Secondary | ICD-10-CM | POA: Diagnosis not present

## 2023-10-02 ENCOUNTER — Ambulatory Visit
Admission: RE | Admit: 2023-10-02 | Discharge: 2023-10-02 | Disposition: A | Discharge status: Home / Self Care | Payer: Medicare Other | Source: Ambulatory Visit | Attending: Obstetrics & Gynecology

## 2023-10-02 DIAGNOSIS — E2839 Other primary ovarian failure: Secondary | ICD-10-CM | POA: Diagnosis not present

## 2023-10-02 DIAGNOSIS — N958 Other specified menopausal and perimenopausal disorders: Secondary | ICD-10-CM | POA: Diagnosis not present

## 2023-10-02 DIAGNOSIS — M810 Age-related osteoporosis without current pathological fracture: Secondary | ICD-10-CM

## 2023-10-02 DIAGNOSIS — M8588 Other specified disorders of bone density and structure, other site: Secondary | ICD-10-CM | POA: Diagnosis not present

## 2023-10-11 ENCOUNTER — Encounter: Payer: Self-pay | Admitting: Internal Medicine

## 2023-10-11 ENCOUNTER — Ambulatory Visit (INDEPENDENT_AMBULATORY_CARE_PROVIDER_SITE_OTHER): Payer: Medicare Other | Admitting: Internal Medicine

## 2023-10-11 VITALS — BP 110/70 | HR 74 | Temp 98.7°F | Ht 62.0 in | Wt 128.4 lb

## 2023-10-11 DIAGNOSIS — I119 Hypertensive heart disease without heart failure: Secondary | ICD-10-CM

## 2023-10-11 DIAGNOSIS — F17210 Nicotine dependence, cigarettes, uncomplicated: Secondary | ICD-10-CM

## 2023-10-11 DIAGNOSIS — I700 Atherosclerosis of aorta: Secondary | ICD-10-CM | POA: Diagnosis not present

## 2023-10-11 DIAGNOSIS — F331 Major depressive disorder, recurrent, moderate: Secondary | ICD-10-CM

## 2023-10-11 DIAGNOSIS — I2510 Atherosclerotic heart disease of native coronary artery without angina pectoris: Secondary | ICD-10-CM | POA: Diagnosis not present

## 2023-10-11 DIAGNOSIS — F1020 Alcohol dependence, uncomplicated: Secondary | ICD-10-CM

## 2023-10-11 MED ORDER — VARENICLINE TARTRATE (STARTER) 0.5 MG X 11 & 1 MG X 42 PO TBPK: ORAL_TABLET | ORAL | 0 refills | Status: DC

## 2023-10-11 NOTE — Progress Notes (Signed)
 I,Victoria T Emmitt, CMA,acting as a neurosurgeon for Catheryn LOISE Slocumb, MD.,have documented all relevant documentation on the behalf of Catheryn LOISE Slocumb, MD,as directed by  Catheryn LOISE Slocumb, MD while in the presence of Catheryn LOISE Slocumb, MD.  Subjective:  Patient ID: Maureen Rodgers , female    DOB: 07-26-1951 , 73 y.o.   MRN: [id number]  Chief Complaint  Patient presents with   Hypertension    HPI  Patient presents today for bp & cholesterol follow up. She reports compliance with medications. Denies having any headache, chest pain, and SOB. She has no specific questions or concerns.      Hypertension This is a chronic problem. The current episode started more than 1 year ago. The problem has been gradually improving since onset. The problem is controlled. Risk factors for coronary artery disease include post-menopausal state, smoking/tobacco exposure and dyslipidemia. Past treatments include calcium  channel blockers. The current treatment provides moderate improvement. Compliance problems include exercise.      Past Medical History:  Diagnosis Date   Anxiety 06/07/2023   Ascending aortic aneurysm (HCC) 11/29/2021   Depression    Essential hypertension 11/29/2021   GERD (gastroesophageal reflux disease)    Tobacco use      Family History  Problem Relation Age of Onset   Heart disease Mother    Dementia Mother    Alcohol abuse Mother    Alcohol abuse Father    Heart disease Brother    Diabetes Brother    Heart attack Maternal Grandmother      Current Outpatient Medications:    alendronate (FOSAMAX) 70 MG tablet, TAKE 1 TABLET (70 MG TOTAL) BY MOUTH EVERY SUNDAY., Disp: 12 tablet, Rfl: 2   ALPRAZolam  (XANAX ) 1 MG tablet, Take 1 mg by mouth 3 (three) times daily as needed for anxiety or sleep., Disp: , Rfl:    atorvastatin  (LIPITOR) 10 MG tablet, Take 1 tablet (10 mg total) by mouth daily., Disp: 90 tablet, Rfl: 1   Calcium  Carbonate-Vit D-Min (CALCIUM  1200 PO), Take 1 tablet by  mouth daily., Disp: , Rfl:    Carboxymethylcellulose Sodium (EYE DROPS OP), Place 1 drop into both eyes 3 (three) times daily as needed (dry eyes)., Disp: , Rfl:    cycloSPORINE  (RESTASIS ) 0.05 % ophthalmic emulsion, , Disp: , Rfl:    DULoxetine  (CYMBALTA ) 60 MG capsule, Take 60 mg by mouth at bedtime., Disp: , Rfl:    esomeprazole (NEXIUM) 40 MG capsule, Take 40 mg by mouth daily at 12 noon., Disp: , Rfl:    fluticasone  (FLONASE ) 50 MCG/ACT nasal spray, Place 1 spray into both nostrils daily. (Patient taking differently: Place 1 spray into both nostrils daily as needed for allergies.), Disp: 16 g, Rfl: 5   Iron-FA-B Cmp-C-Biot-Probiotic (FUSION PLUS) CAPS, TAKE 1 CAPSULE BY MOUTH MONDAY THROUGH FRIDAYS, Disp: 30 capsule, Rfl: 3   Magnesium 400 MG TABS, Take 1 tablet by mouth daily., Disp: , Rfl:    Omega-3 Fatty Acids (FISH OIL) 1000 MG CAPS, Take 1,000 mg by mouth daily. , Disp: , Rfl:    traZODone  (DESYREL ) 100 MG tablet, Take 100 mg by mouth at bedtime., Disp: , Rfl:    Varenicline  Tartrate, Starter, (CHANTIX  STARTING MONTH PAK) 0.5 MG X 11 & 1 MG X 42 TBPK, Starter pack, use as directed, Disp: 53 each, Rfl: 0   amLODipine  (NORVASC ) 10 MG tablet, Take 1 tablet (10 mg total) by mouth daily., Disp: 90 tablet, Rfl: 3   No Known Allergies  Review of Systems  Constitutional: Negative.   Respiratory: Negative.    Cardiovascular: Negative.   Neurological: Negative.   Psychiatric/Behavioral: Negative.       Today's Vitals   10/11/23 1021  BP: 110/70  Pulse: 74  Temp: 98.7 F (37.1 C)  SpO2: 98%  Weight: 128 lb 6.4 oz (58.2 kg)  Height: 5' 2 (1.575 m)   Body mass index is 23.48 kg/m.  Wt Readings from Last 3 Encounters:  10/11/23 128 lb 6.4 oz (58.2 kg)  06/07/23 127 lb 3.2 oz (57.7 kg)  04/13/23 124 lb (56.2 kg)     Objective:  Physical Exam Vitals and nursing note reviewed.  Constitutional:      Appearance: Normal appearance.  HENT:     Head: Normocephalic and  atraumatic.  Eyes:     Extraocular Movements: Extraocular movements intact.  Cardiovascular:     Rate and Rhythm: Normal rate and regular rhythm.     Heart sounds: Normal heart sounds.  Pulmonary:     Effort: Pulmonary effort is normal.     Breath sounds: Normal breath sounds.  Musculoskeletal:     Cervical back: Normal range of motion.  Skin:    General: Skin is warm.  Neurological:     General: No focal deficit present.     Mental Status: She is alert.  Psychiatric:        Mood and Affect: Mood normal.        Behavior: Behavior normal.         Assessment And Plan:  Hypertensive heart disease without heart failure Assessment & Plan: Chronic, well controlled. She will c/w amlodipine  10mg  daily. She is encouraged to follow low sodium diet.  She will f/u in six months for re-evaluation.   Orders: -     CBC -     CMP14+EGFR -     Lipid panel  Coronary artery disease involving native coronary artery of native heart without angina pectoris Assessment & Plan: Chronic, LDL goal < 70.  She will c/w atorvastatin  10mg  daily. She is encouraged to follow heart healthy lifestyle.   Orders: -     Lipid panel  Atherosclerosis of aorta (HCC) Assessment & Plan: Chronic, LDL goal < 70.  She will c/w atorvastatin  10mg  daily. She is encouraged to follow heart healthy lifestyle.   Orders: -     Lipid panel  Moderate episode of recurrent major depressive disorder [facility]) Assessment & Plan: She is encouraged to contact her previous therapist to schedule counseling sessions. In the meantime, she agrees to [employer] referral for further evaluation. Pt is advised she will be advised of community resources that are available to her. She is accepting of referral and information.   Orders: -     AMB Referral [facility] Care Management  Cigarette nicotine dependence without complication Assessment & Plan: She has greater than 30 pack year history of tobacco use. Her last LDCT was in  January 2024, next one due January 2025.  Orders: -     AMB Referral [facility] Care Management -     CT CHEST LUNG CANCER SCREENING LOW DOSE WO CONTRAST; Future  Alcohol use disorder, moderate, dependence (HCC) -     AMB Referral [facility] Care Management  Other orders -     Varenicline  Tartrate (Starter); Starter pack, use as directed  Dispense: 53 each; Refill: 0     Return if symptoms worsen or fail to improve.  Patient was given opportunity to ask questions. Patient  verbalized understanding of the plan and was able to repeat key elements of the plan. All questions were answered to their satisfaction.    I, Catheryn LOISE Slocumb, MD, have reviewed all documentation for this visit. The documentation on 10/11/23 for the exam, diagnosis, procedures, and orders are all accurate and complete.   IF YOU HAVE BEEN REFERRED TO A SPECIALIST, IT MAY TAKE 1-2 WEEKS TO SCHEDULE/PROCESS THE REFERRAL. IF YOU HAVE NOT HEARD FROM US /SPECIALIST IN TWO WEEKS, PLEASE GIVE US  A CALL AT [phone] X [phone].   THE PATIENT IS ENCOURAGED TO PRACTICE SOCIAL DISTANCING DUE TO THE COVID-19 PANDEMIC.

## 2023-10-11 NOTE — Patient Instructions (Signed)
 Hypertension, Adult Hypertension is another name for high blood pressure. High blood pressure forces your heart to work harder to pump blood. This can cause problems over time. There are two numbers in a blood pressure reading. There is a top number (systolic) over a bottom number (diastolic). It is best to have a blood pressure that is below 120/80. What are the causes? The cause of this condition is not known. Some other conditions can lead to high blood pressure. What increases the risk? Some lifestyle factors can make you more likely to develop high blood pressure: Smoking. Not getting enough exercise or physical activity. Being overweight. Having too much fat, sugar, calories, or salt (sodium) in your diet. Drinking too much alcohol. Other risk factors include: Having any of these conditions: Heart disease. Diabetes. High cholesterol. Kidney disease. Obstructive sleep apnea. Having a family history of high blood pressure and high cholesterol. Age. The risk increases with age. Stress. What are the signs or symptoms? High blood pressure may not cause symptoms. Very high blood pressure (hypertensive crisis) may cause: Headache. Fast or uneven heartbeats (palpitations). Shortness of breath. Nosebleed. Vomiting or feeling like you may vomit (nauseous). Changes in how you see. Very bad chest pain. Feeling dizzy. Seizures. How is this treated? This condition is treated by making healthy lifestyle changes, such as: Eating healthy foods. Exercising more. Drinking less alcohol. Your doctor may prescribe medicine if lifestyle changes do not help enough and if: Your top number is above 130. Your bottom number is above 80. Your personal target blood pressure may vary. Follow these instructions at home: Eating and drinking  If told, follow the DASH eating plan. To follow this plan: Fill one half of your plate at each meal with fruits and vegetables. Fill one fourth of your plate  at each meal with whole grains. Whole grains include whole-wheat pasta, brown rice, and whole-grain bread. Eat or drink low-fat dairy products, such as skim milk or low-fat yogurt. Fill one fourth of your plate at each meal with low-fat (lean) proteins. Low-fat proteins include fish, chicken without skin, eggs, beans, and tofu. Avoid fatty meat, cured and processed meat, or chicken with skin. Avoid pre-made or processed food. Limit the amount of salt in your diet to less than 1,500 mg each day. Do not drink alcohol if: Your doctor tells you not to drink. You are pregnant, may be pregnant, or are planning to become pregnant. If you drink alcohol: Limit how much you have to: 0-1 drink a day for women. 0-2 drinks a day for men. Know how much alcohol is in your drink. In the [place] one drink equals one 12 oz bottle of beer (355 mL), one 5 oz glass of wine (148 mL), or one 1 oz glass of hard liquor (44 mL). Lifestyle  Work with your doctor to stay at a healthy weight or to lose weight. Ask your doctor what the best weight is for you. Get at least 30 minutes of exercise that causes your heart to beat faster (aerobic exercise) most days of the week. This may include walking, swimming, or biking. Get at least 30 minutes of exercise that strengthens your muscles (resistance exercise) at least 3 days a week. This may include lifting weights or doing Pilates. Do not smoke or use any products that contain nicotine or tobacco. If you need help quitting, ask your doctor. Check your blood pressure at home as told by your doctor. Keep all follow-up visits. Medicines Take over-the-counter and prescription medicines  only as told by your doctor. Follow directions carefully. Do not skip doses of blood pressure medicine. The medicine does not work as well if you skip doses. Skipping doses also puts you at risk for problems. Ask your doctor about side effects or reactions to medicines that you should watch  for. Contact a doctor if: You think you are having a reaction to the medicine you are taking. You have headaches that keep coming back. You feel dizzy. You have swelling in your ankles. You have trouble with your vision. Get help right away if: You get a very bad headache. You start to feel mixed up (confused). You feel weak or numb. You feel faint. You have very bad pain in your: Chest. Belly (abdomen). You vomit more than once. You have trouble breathing. These symptoms may be an emergency. Get help right away. Call 911. Do not wait to see if the symptoms will go away. Do not drive yourself to the hospital. Summary Hypertension is another name for high blood pressure. High blood pressure forces your heart to work harder to pump blood. For most people, a normal blood pressure is less than 120/80. Making healthy choices can help lower blood pressure. If your blood pressure does not get lower with healthy choices, you may need to take medicine. This information is not intended to replace advice given to you by your health care provider. Make sure you discuss any questions you have with your health care provider. Document Revised: 07/08/2021 Document Reviewed: 07/08/2021 Elsevier Patient Education  2024 [employer].

## 2023-10-12 ENCOUNTER — Telehealth: Payer: Self-pay | Admitting: *Deleted

## 2023-10-12 LAB — LIPID PANEL
Chol/HDL Ratio: 1.5 {ratio} (ref 0.0–4.4)
Cholesterol, Total: 164 mg/dL (ref 100–199)
HDL: 107 mg/dL (ref >39)
LDL Chol Calc (NIH): 47 mg/dL (ref 0–99)
Triglycerides: 43 mg/dL (ref 0–149)
VLDL Cholesterol Cal: 10 mg/dL (ref 5–40)

## 2023-10-12 LAB — CMP14+EGFR
ALT: 21 [IU]/L (ref 0–32)
AST: 26 [IU]/L (ref 0–40)
Albumin: 4.4 g/dL (ref 3.8–4.8)
Alkaline Phosphatase: 68 [IU]/L (ref 44–121)
BUN/Creatinine Ratio: 12 (ref 12–28)
BUN: 9 mg/dL (ref 8–27)
Bilirubin Total: 0.4 mg/dL (ref 0.0–1.2)
CO2: 22 mmol/L (ref 20–29)
Calcium: 9.4 mg/dL (ref 8.7–10.3)
Chloride: 101 mmol/L (ref 96–106)
Creatinine, Ser: 0.77 mg/dL (ref 0.57–1.00)
Globulin, Total: 2.4 g/dL (ref 1.5–4.5)
Glucose: 100 mg/dL — ABNORMAL HIGH (ref 70–99)
Potassium: 4.3 mmol/L (ref 3.5–5.2)
Sodium: 140 mmol/L (ref 134–144)
Total Protein: 6.8 g/dL (ref 6.0–8.5)
eGFR: 82 mL/min/{1.73_m2} (ref >59)

## 2023-10-12 LAB — CBC
Hematocrit: 39.2 % (ref 34.0–46.6)
Hemoglobin: 13.6 g/dL (ref 11.1–15.9)
MCH: 33.3 pg — ABNORMAL HIGH (ref 26.6–33.0)
MCHC: 34.7 g/dL (ref 31.5–35.7)
MCV: 96 fL (ref 79–97)
Platelets: 324 10*3/uL (ref 150–450)
RBC: 4.08 x10E6/uL (ref 3.77–5.28)
RDW: 12.3 % (ref 11.7–15.4)
WBC: 7 10*3/uL (ref 3.4–10.8)

## 2023-10-12 NOTE — Progress Notes (Signed)
 Complex Care Management Note Care Guide Note  10/12/2023 Name: AYLIN RHOADS MRN: [id number] DOB: 1951-01-04   Complex Care Management Outreach Attempts: An unsuccessful telephone outreach was attempted today to offer the patient information about available complex care management services.  Follow Up Plan:  Additional outreach attempts will be made to offer the patient complex care management information and services.   Encounter Outcome:  No Answer Harlene Satterfield  Care Coordination Care Guide  Direct Dial: [phone]

## 2023-10-14 NOTE — Assessment & Plan Note (Signed)
 She is encouraged to contact her previous therapist to schedule counseling sessions. In the meantime, she agrees to [employer] referral for further evaluation. Pt is advised she will be advised of community resources that are available to her. She is accepting of referral and information.

## 2023-10-14 NOTE — Assessment & Plan Note (Signed)
Chronic, LDL goal < 70.  She will c/w atorvastatin 10mg  daily. She is encouraged to follow heart healthy lifestyle.

## 2023-10-14 NOTE — Assessment & Plan Note (Signed)
Chronic, well controlled. She will c/w amlodipine 10mg  daily. She is encouraged to follow low sodium diet.  She will f/u in six months for re-evaluation.

## 2023-10-14 NOTE — Assessment & Plan Note (Signed)
She has greater than 30 pack year history of tobacco use. Her last LDCT was in January 2024, next one due January 2025.

## 2023-10-16 NOTE — Progress Notes (Signed)
 Complex Care Management Care Guide Note  10/16/2023 Name: Maureen Rodgers MRN: [id number] DOB: 1951/06/24  Maureen Rodgers is a 73 y.o. year old female who is a primary care patient of Jarold Medici, MD and is actively engaged with the care management team. I reached out to Bridgette CHRISTELLA Essex by phone today to assist with scheduling  with the Licensed Clinical Social Worker.  Follow up plan: Telephone appointment with complex care management team member scheduled for:  10/26/23  [facility]  Care Coordination Care Guide  Direct Dial: [phone]

## 2023-10-26 ENCOUNTER — Ambulatory Visit: Payer: Self-pay | Admitting: Licensed Clinical Social Worker

## 2023-10-26 NOTE — Patient Outreach (Signed)
  Care Coordination   Initial Visit Note   10/26/2023 Name: Maureen Rodgers MRN: [id number] DOB: 16-Aug-1951  Maureen Rodgers is a 73 y.o. year old female who sees Dorothyann Peng, MD for primary care. I spoke with  [name] by phone today.  What matters to the patients health and wellness today?  Support Resources, Symptom Management    Goals Addressed             This Visit's Progress    Obtain Supportive Resources-MH Support   On track    Activities and task to complete in order to accomplish goals.   Keep all upcoming appointments discussed today Continue with compliance of taking medication prescribed by Doctor Implement healthy coping skills discussed to assist with management of symptoms         SDOH assessments and interventions completed:  Yes  SDOH Interventions Today    Flowsheet Row Most Recent Value  SDOH Interventions   Food Insecurity Interventions Intervention Not Indicated  Housing Interventions Intervention Not Indicated  Transportation Interventions Intervention Not Indicated  Utilities Interventions Intervention Not Indicated  Social Connections Interventions Intervention Not Indicated        Care Coordination Interventions:  Yes, provided  Interventions Today    Flowsheet Row Most Recent Value  Chronic Disease   Chronic disease during today's visit Hypertension (HTN), Other  [MDD, Anxiety, Alcohol Use Disorder]  General Interventions   General Interventions Discussed/Reviewed General Interventions Discussed, Community Resources, Doctor Visits  [Discussed local support agencies]  Doctor Visits Discussed/Reviewed Doctor Visits Discussed  Mental Health Interventions   Mental Health Discussed/Reviewed Mental Health Discussed, Coping Strategies, Anxiety, Depression, Grief and Loss, Substance Abuse  [Pt has contacted previous therapist, Graylin Shiver, to re-establish services. Participates in med management with Dr. Clarnce Flock.  Discussed susbstance use resources and strategies to assist with management of symptoms]  Nutrition Interventions   Nutrition Discussed/Reviewed Nutrition Discussed  Pharmacy Interventions   Pharmacy Dicussed/Reviewed Pharmacy Topics Discussed, Medication Adherence  Safety Interventions   Safety Discussed/Reviewed Safety Discussed       Follow up plan: Follow up call scheduled for 2-4 weeks    Encounter Outcome:  Patient Visit Completed   Jenel Lucks, LCSW   [facility], [facility] Clinical Social Worker Direct Dial: [phone]  Fax: [phone] Website: [email or web address] 4:29 PM

## 2023-10-26 NOTE — Patient Instructions (Signed)
Visit Information  Thank you for taking time to visit with me today. Please don't hesitate to contact me if I can be of assistance to you.   Following are the goals we discussed today:   Goals Addressed             This Visit's Progress    Obtain Supportive Resources-MH Support   On track    Activities and task to complete in order to accomplish goals.   Keep all upcoming appointments discussed today Continue with compliance of taking medication prescribed by Doctor Implement healthy coping skills discussed to assist with management of symptoms         Our next appointment is by telephone on 2/19 at 3 PM  Please call the care guide team at [phone] if you need to cancel or reschedule your appointment.   If you are experiencing a Mental Health or Behavioral Health Crisis or need someone to talk to, please call the Suicide and Crisis Lifeline: 988 call 911   Patient verbalizes understanding of instructions and care plan provided today and agrees to view in MyChart. Active MyChart status and patient understanding of how to access instructions and care plan via MyChart confirmed with patient.     Windy Fast [facility] Health  [facility], [facility] Clinical Social Worker Direct Dial: [phone]  Fax: [phone] Website: [email or web address] 4:29 PM

## 2023-10-27 ENCOUNTER — Ambulatory Visit
Admission: RE | Admit: 2023-10-27 | Discharge: 2023-10-27 | Disposition: A | Discharge status: Home / Self Care | Payer: Medicare Other | Source: Ambulatory Visit | Attending: Internal Medicine | Admitting: Internal Medicine

## 2023-10-27 ENCOUNTER — Inpatient Hospital Stay: Admission: RE | Admit: 2023-10-27 | Payer: Medicare Other | Source: Ambulatory Visit

## 2023-10-27 DIAGNOSIS — F17210 Nicotine dependence, cigarettes, uncomplicated: Secondary | ICD-10-CM

## 2023-11-06 ENCOUNTER — Ambulatory Visit: Payer: Self-pay

## 2023-11-06 NOTE — Patient Instructions (Signed)
Visit Information  Thank you for taking time to visit with me today. Please don't hesitate to contact me if I can be of assistance to you.   Following are the goals we discussed today:   Goals Addressed             This Visit's Progress    Grief - loss of close friends       Care Coordination Interventions: Evaluation of current treatment plan related to grief and patient's adherence to plan as established by provider Determined patient has lost several of her closet friends close together, she is feeling down and is grieving  Educated patient regarding availability of [facility] LCSW for grief counseling, patient declines at this time and is currently established with outpatient counseling Instructed patient to notify her doctor promptly if her symptoms worsen or she needs additional grief counseling  Active listening / Reflection utilized  Emotional Support Provided     To improve self-health management of anxiety       Care Coordination Interventions: Evaluation of current treatment plan related to anxiety and patient's adherence to plan as established by provider Determined patient is experiencing increased anxiety related to having several personal stressors that involve caring for other members of her family Active listening / Reflection utilized  Emotional Support Provided  Discussed patient continues to be isolated, she uses alcohol and cigarettes as a means of calming herself and knows these are not the best habits to have, she is not ready to quit smoking Reviewed and discussed with patient her next scheduled telephone follow up with Jenel Lucks LCSW for counseling and support, set for 11/22/23 @3 :00 PM Re-educated patient about the PREP program to help establish an exercise regimen, help with isolation and socialization, patient declines at this time, she will consider for the future Mailed printed educational materials related to Chair Exercises; PREP brochure Patient will  continue to keep all scheduled telephone appointments with  LCSW Jenel Lucks for counseling and support Patient will keep her PCP well informed of new or worsening symptoms promptly Patient will take all medications exactly as prescribed Patient will continue to work with nurse care coordinator for chronic disease management and care coordination services     To monitor BP at home as directed   On track    Care Coordination Interventions: Evaluation of current treatment plan related to hypertension self management and patient's adherence to plan as established by provider Reviewed medications with patient and discussed importance of medication adherence Advised patient, providing education and rationale, to monitor blood pressure daily and record, calling PCP for findings outside established parameters; Educated patient regarding target BP <130/80 Assessed social determinant of health barriers Discussed with patient her next scheduled follow up with Jenel Lucks LCSW is scheduled for 11/22/23 @3 :00 PM Discussed plans with patient for ongoing care coordination follow up and provided patient with direct contact information for nurse care coordinator Mailed printed educational materials related to How to Accurately Measure BP at Home Patient will keep all scheduled in person and telephone appointments as directed  Patient will continue to monitor her BP at home as directed, she will report PCP findings outside of established parameters Patient will continue to take her medications exactly as prescribed  Last practice recorded BP readings:  BP Readings from Last 3 Encounters:  10/11/23 110/70  06/07/23 130/79  04/13/23 108/76   Most recent eGFR/CrCl:  Lab Results  Component Value Date   EGFR 82 10/11/2023    No components found  for: "CRCL"         Our next appointment is by telephone on 01/04/24 at 11:30 AM  Please call the care guide team at [phone] if you need to cancel or  reschedule your appointment.   If you are experiencing a Mental Health or Behavioral Health Crisis or need someone to talk to, please call 1-800-273-TALK (toll free, 24 hour hotline)  Patient verbalizes understanding of instructions and care plan provided today and agrees to view in MyChart. Active MyChart status and patient understanding of how to access instructions and care plan via MyChart confirmed with patient.     Delsa Sale RN BSN CCM   [facility], [facility] Health Nurse Care Coordinator  Direct Dial: [phone] Website: [email or web address]

## 2023-11-06 NOTE — Patient Outreach (Signed)
  Care Coordination   11/06/2023 Name: ROENA SASSAMAN MRN: [id number] DOB: [birth year]   Care Coordination Outreach Attempts:  An unsuccessful outreach was attempted for an appointment today.  Follow Up Plan:  Additional outreach attempts will be made to offer the patient complex care management information and services.   Encounter Outcome:  No Answer   Care Coordination Interventions:  No, not indicated    Delsa Sale RN BSN CCM [place]  [facility], [facility] Health Nurse Care Coordinator  Direct Dial: [phone] Website: [email or web address]

## 2023-11-06 NOTE — Patient Outreach (Signed)
Care Coordination   Initial Visit Note   11/06/2023 Name: Maureen Rodgers MRN: [id number] DOB: Mar 04, 1951  NICOLEMARIE Rodgers is a 73 y.o. year old female who sees Dorothyann Peng, MD for primary care. I spoke with  [name] by phone today.  What matters to the patients health and wellness today?  Patient would like to monitor her BP at home. Patient would like to continue to work with LCSW Jenel Lucks for counseling and support of her anxiety.     Goals Addressed             This Visit's Progress    Grief - loss of close friends       Care Coordination Interventions: Evaluation of current treatment plan related to grief and patient's adherence to plan as established by provider Determined patient has lost several of her closet friends close together, she is feeling down and is grieving  Educated patient regarding availability of [facility] L[facility]W for grief counseling, patient declines at this time and is currently established with outpatient counseling Instructed patient to notify her doctor promptly if her symptoms worsen or she needs additional grief counseling  Active listening / Reflection utilized  Emotional Support Provided     To improve self-health management of anxiety       Care Coordination Interventions: Evaluation of current treatment plan related to anxiety and patient's adherence to plan as established by provider Determined patient is experiencing increased anxiety related to having several personal stressors that involve caring for other members of her family Active listening / Reflection utilized  Emotional Support Provided  Discussed patient continues to be isolated, she uses alcohol and cigarettes as a means of calming herself and knows these are not the best habits to have, she is not ready to quit smoking Reviewed and discussed with patient her next scheduled telephone follow up with Jenel Lucks LCSW for counseling and support, set for 11/22/23 @3 :00  PM Re-educated patient about the PREP program to help establish an exercise regimen, help with isolation and socialization, patient declines at this time, she will consider for the future Mailed printed educational materials related to Chair Exercises; PREP brochure Patient will continue to keep all scheduled telephone appointments with  LCSW Jenel Lucks for counseling and support Patient will keep her PCP well informed of new or worsening symptoms promptly Patient will take all medications exactly as prescribed Patient will continue to work with nurse care coordinator for chronic disease management and care coordination services     To monitor BP at home as directed   On track    Care Coordination Interventions: Evaluation of current treatment plan related to hypertension self management and patient's adherence to plan as established by provider Reviewed medications with patient and discussed importance of medication adherence Advised patient, providing education and rationale, to monitor blood pressure daily and record, calling PCP for findings outside established parameters; Educated patient regarding target BP <130/80 Assessed social determinant of health barriers Discussed with patient her next scheduled follow up with Jenel Lucks LCSW is scheduled for 11/22/23 @3 :00 PM Discussed plans with patient for ongoing care coordination follow up and provided patient with direct contact information for nurse care coordinator Mailed printed educational materials related to How to Accurately Measure BP at Home Patient will keep all scheduled in person and telephone appointments as directed  Patient will continue to monitor her BP at home as directed, she will report PCP findings outside of established parameters Patient will continue to  take her medications exactly as prescribed  Last practice recorded BP readings:  BP Readings from Last 3 Encounters:  10/11/23 110/70  06/07/23 130/79  04/13/23  108/76   Most recent eGFR/CrCl:  Lab Results  Component Value Date   EGFR 82 10/11/2023    No components found for: "CRCL"     Interventions Today    Flowsheet Row Most Recent Value  Chronic Disease   Chronic disease during today's visit Hypertension (HTN), Other  [Anxiety,  smoking cessation,  alcohol use]  General Interventions   General Interventions Discussed/Reviewed General Interventions Discussed, General Interventions Reviewed, Doctor Visits, Labs, Durable Medical Equipment (DME)  Doctor Visits Discussed/Reviewed Doctor Visits Reviewed, Doctor Visits Discussed, PCP  Durable Medical Equipment (DME) BP Cuff  Exercise Interventions   Exercise Discussed/Reviewed Exercise Reviewed, Exercise Discussed, Physical Activity  Physical Activity Discussed/Reviewed Physical Activity Reviewed, Physical Activity Discussed, Types of exercise  Education Interventions   Education Provided Provided Education, Provided Printed Education  Provided Verbal Education On When to see the doctor, Mental Health/Coping with Illness, Labs, Medication, Exercise  Labs Reviewed Kidney Function  Mental Health Interventions   Mental Health Discussed/Reviewed Mental Health Discussed, Mental Health Reviewed, Anxiety, Depression, Substance Abuse  Pharmacy Interventions   Pharmacy Dicussed/Reviewed Pharmacy Topics Discussed, Pharmacy Topics Reviewed          SDOH assessments and interventions completed:  No     Care Coordination Interventions:  Yes, provided   Follow up plan: Follow up call scheduled for 01/04/24 @11 :30 AM    Encounter Outcome:  Patient Visit Completed

## 2023-11-21 ENCOUNTER — Telehealth: Payer: Self-pay | Admitting: Licensed Clinical Social Worker

## 2023-11-21 NOTE — Patient Outreach (Signed)
  Care Coordination   11/21/2023 Name: VINAYA SANCHO MRN: [id number] DOB: [birth year]   Care Coordination Outreach Attempts:  An unsuccessful outreach was attempted to r/s for an appointment scheduled for 11/22/23  Follow Up Plan:  LCSW will collaborate with Scheduler to r/s f/up appt  Encounter Outcome:  No Answer   Care Coordination Interventions:  No, not indicated    Jenel Lucks, LCSW [place]  [facility], [facility] Clinical Social Worker Direct Dial: [phone]  Fax: [phone] Website: [email or web address] 4:29 PM

## 2023-11-22 ENCOUNTER — Encounter: Payer: Self-pay | Admitting: Licensed Clinical Social Worker

## 2023-11-26 ENCOUNTER — Other Ambulatory Visit (HOSPITAL_BASED_OUTPATIENT_CLINIC_OR_DEPARTMENT_OTHER): Payer: Self-pay | Admitting: Cardiovascular Disease

## 2023-12-04 ENCOUNTER — Telehealth: Payer: Self-pay | Admitting: *Deleted

## 2023-12-04 NOTE — Progress Notes (Signed)
 Complex Care Management Care Guide Note  12/04/2023 Name: KYNSIE FALKNER MRN: [id number] DOB: 27-Oct-1950  ROSALAND SHIFFMAN is a 73 y.o. year old female who is a primary care patient of Dorothyann Peng, MD and is actively engaged with the care management team. I reached out to [name] by phone today to assist with re-scheduling  with the Licensed Clinical Child psychotherapist.  Follow up plan: Unsuccessful telephone outreach attempt made.  Gwenevere Ghazi  [facility] Health  Value-Based Care Institute, [facility] Guide  Direct Dial: [phone]  Fax [phone]

## 2023-12-11 ENCOUNTER — Other Ambulatory Visit: Payer: Self-pay | Admitting: Cardiovascular Disease

## 2023-12-11 ENCOUNTER — Other Ambulatory Visit: Payer: Self-pay | Admitting: Internal Medicine

## 2023-12-11 DIAGNOSIS — F411 Generalized anxiety disorder: Secondary | ICD-10-CM | POA: Diagnosis not present

## 2023-12-11 DIAGNOSIS — F331 Major depressive disorder, recurrent, moderate: Secondary | ICD-10-CM | POA: Diagnosis not present

## 2023-12-11 NOTE — Progress Notes (Signed)
 Complex Care Management Care Guide Note  12/11/2023 Name: Maureen Rodgers MRN: [id number] DOB: 01-08-1951  Maureen Rodgers is a 73 y.o. year old female who is a primary care patient of Dorothyann Peng, MD and is actively engaged with the care management team. I reached out to [name] by phone today to assist with re-scheduling  with the Licensed Clinical Child psychotherapist.  Follow up plan: Telephone appointment with complex care management team member scheduled for:  3/19  Gwenevere Ghazi  [facility] Health  Value-Based Care Institute, [facility] Guide  Direct Dial: [phone]  Fax [phone]

## 2023-12-16 ENCOUNTER — Other Ambulatory Visit: Payer: Self-pay | Admitting: Internal Medicine

## 2023-12-19 DIAGNOSIS — M25562 Pain in left knee: Secondary | ICD-10-CM | POA: Diagnosis not present

## 2023-12-19 DIAGNOSIS — M1712 Unilateral primary osteoarthritis, left knee: Secondary | ICD-10-CM | POA: Diagnosis not present

## 2023-12-19 DIAGNOSIS — M79662 Pain in left lower leg: Secondary | ICD-10-CM | POA: Diagnosis not present

## 2023-12-20 ENCOUNTER — Ambulatory Visit: Payer: Self-pay | Admitting: Licensed Clinical Social Worker

## 2023-12-20 NOTE — Patient Outreach (Signed)
 Care Coordination   Follow Up Visit Note   12/20/2023 Name: Maureen Rodgers MRN: [id number] DOB: 10/01/1951  Maureen Rodgers is a 73 y.o. year old female who sees Dorothyann Peng, MD for primary care. I spoke with  [name] by phone today.  What matters to the patients health and wellness today?  Symptom Management    Goals Addressed             This Visit's Progress    Obtain Supportive Resources-MH Support   On track    Activities and task to complete in order to accomplish goals.   Keep all upcoming appointments discussed today Continue with compliance of taking medication prescribed by Doctor Implement healthy coping skills discussed to assist with management of symptoms F/up with previous therapist to inquire about re-establishing services         SDOH assessments and interventions completed:  No     Care Coordination Interventions:  Yes, provided  Interventions Today    Flowsheet Row Most Recent Value  Chronic Disease   Chronic disease during today's visit Other  [MDD, Anxiety, Alcohol Use Disorder]  General Interventions   General Interventions Discussed/Reviewed General Interventions Reviewed, Doctor Visits  Doctor Visits Discussed/Reviewed Doctor Visits Reviewed  Exercise Interventions   Exercise Discussed/Reviewed Exercise Reviewed, Physical Activity  [Pt got a knee brace last week due to limited mobility. She also obtains injections for pain management. States she feels better and is focused on moving her body more]  Mental Health Interventions   Mental Health Discussed/Reviewed Mental Health Reviewed, Coping Strategies, Anxiety, Depression, Substance Abuse  [Patient continues to reflect on past and identified healthy coping skills to assist with management of symptoms. She reports management of SUD/MH, no report of SI/HI. Self care encouraged and identifiying goals for next couple of months]  Nutrition Interventions   Nutrition Discussed/Reviewed  Nutrition Reviewed, Fluid intake  [Patient prioritizes drinking water]  Pharmacy Interventions   Pharmacy Dicussed/Reviewed Pharmacy Topics Reviewed, Medication Adherence  Safety Interventions   Safety Discussed/Reviewed Safety Reviewed       Follow up plan: Follow up call scheduled for 4 weeks    Encounter Outcome:  Patient Visit Completed   Jenel Lucks, LCSW [place]  [facility], [facility] Clinical Social Worker Direct Dial: [phone]  Fax: [phone] Website: [email or web address] 12:24 PM

## 2023-12-20 NOTE — Patient Instructions (Signed)
 Visit Information  Thank you for taking time to visit with me today. Please don't hesitate to contact me if I can be of assistance to you.   Following are the goals we discussed today:   Goals Addressed             This Visit's Progress    Obtain Supportive Resources-MH Support   On track    Activities and task to complete in order to accomplish goals.   Keep all upcoming appointments discussed today Continue with compliance of taking medication prescribed by Doctor Implement healthy coping skills discussed to assist with management of symptoms F/up with previous therapist to inquire about re-establishing services         Our next appointment is by telephone on 4/16 PM at 12:30 PM  Please call the care guide team at [phone] if you need to cancel or reschedule your appointment.   If you are experiencing a Mental Health or Behavioral Health Crisis or need someone to talk to, please call the Suicide and Crisis Lifeline: 988 call 911   Patient verbalizes understanding of instructions and care plan provided today and agrees to view in MyChart. Active MyChart status and patient understanding of how to access instructions and care plan via MyChart confirmed with patient.     Windy Fast [facility]  [facility], [facility] Clinical Social Worker Direct Dial: [phone]  Fax: [phone] Website: [email or web address] 12:25 PM

## 2024-01-01 DIAGNOSIS — M25552 Pain in left hip: Secondary | ICD-10-CM | POA: Diagnosis not present

## 2024-01-01 DIAGNOSIS — M545 Low back pain, unspecified: Secondary | ICD-10-CM | POA: Diagnosis not present

## 2024-01-01 DIAGNOSIS — M25562 Pain in left knee: Secondary | ICD-10-CM | POA: Diagnosis not present

## 2024-01-04 ENCOUNTER — Ambulatory Visit: Payer: Self-pay

## 2024-01-04 NOTE — Patient Outreach (Signed)
 Care Coordination   01/04/2024 Name: Maureen Rodgers MRN: [id number] DOB: [birth year]   Care Coordination Outreach Attempts:  An unsuccessful outreach was attempted for an appointment today.  Follow Up Plan:  Additional outreach attempts will be made to offer the patient complex care management information and services.   Encounter Outcome:  No Answer   Care Coordination Interventions:  No, not indicated    Delsa Sale RN BSN CCM [place]  [facility], [facility] Nurse Care Coordinator  Direct Dial: [phone] Website: [email or web address]

## 2024-01-17 ENCOUNTER — Ambulatory Visit: Payer: Self-pay | Admitting: Licensed Clinical Social Worker

## 2024-01-19 NOTE — Patient Instructions (Signed)
 Visit Information  Thank you for taking time to visit with me today. Please don't hesitate to contact me if I can be of assistance to you before our next scheduled appointment.  Our next appointment is by telephone on 5/7 at 12:30 pm Please call the care guide team at [phone] if you need to cancel or reschedule your appointment.   Following is a copy of your care plan:   Goals Addressed             This Visit's Progress    LCSW VBCI Social Work Care Plan   On track    Problems:   Disease Management support and education needs related to Anxiety with Excessive Worry, Panic Symptoms, and Alcohol Use Disorder  CSW Clinical Goal(s):   Over the next 90 days the Patient will attend all scheduled medical appointments as evidenced by patient report and care team review of appointment completion in electronic MEDICAL RECORD NUMBER  demonstrate a reduction in symptoms related to Anxiety with Excessive Worry, Panic Symptoms, Alcohol Use Disorder .  Interventions:  Mental Health:  Evaluation of current treatment plan related to Anxiety with Excessive Worry, Panic Symptoms, Active listening / Reflection utilized Behavioral Activation reviewed Emotional Support Provided Motivational Interviewing employed Participation in counseling encourage : Patient will schedule appt to re-establish with counselor, Sunny English Provided general psycho-education for mental health needs Solution-Focued Strategies employed: Substance Misuse in a patient with  Alcohol Use Disorder :   Evaluation of current treatment plan related to misuse of: alcohol Active listening / Reflection utilized Mining engineer reviewed Emotional Support Provided Participation in counseling encourage : Patient will schedule appt to re-establish counseling Provided general psycho-education for mental health needs Solution-Focued Strategies employed:  Patient Goals/Self-Care Activities:  Increase coping skills and  healthy habits  Plan:   Telephone follow up appointment with care management team member scheduled for:  2-4 weeks     COMPLETED: Obtain Supportive Resources-MH Support       Activities and task to complete in order to accomplish goals.   Keep all upcoming appointments discussed today Continue with compliance of taking medication prescribed by Doctor Implement healthy coping skills discussed to assist with management of symptoms F/up with previous therapist to inquire about re-establishing services         Please call the Suicide and Crisis Lifeline: 988 call 911 if you are experiencing a Mental Health or Behavioral Health Crisis or need someone to talk to.  Patient verbalizes understanding of instructions and care plan provided today and agrees to view in MyChart. Active MyChart status and patient understanding of how to access instructions and care plan via MyChart confirmed with patient.     Arlis Bent [facility]  [facility], [facility] Clinical Social Worker Direct Dial: [phone]  Fax: [phone] Website: [email or web address] 2:03 PM

## 2024-01-19 NOTE — Patient Outreach (Signed)
 Complex Care Management   Visit Note  01/17/2024  Name:  Maureen Rodgers MRN: [id number] DOB: [birth year]  Situation: Referral received for Complex Care Management related to [place]/Behavioral Health diagnosis Anxiety and Substance Abuse/Misuse Alcohol Use Disorder  I obtained verbal consent from Patient.  Visit completed with patient  on the phone  Background:   Past Medical History:  Diagnosis Date   Anxiety 06/07/2023   Ascending aortic aneurysm (HCC) 11/29/2021   Depression    Essential hypertension 11/29/2021   GERD (gastroesophageal reflux disease)    Tobacco use     Assessment: Patient Reported Symptoms:  Cognitive Cognitive Status: Alert and oriented to person, place, and time      Neurological Neurological Review of Symptoms: No symptoms reported    HEENT HEENT Symptoms Reported: No symptoms reported      Cardiovascular Cardiovascular Symptoms Reported: No symptoms reported    Respiratory Respiratory Symptoms Reported: No symptoms reported    Endocrine Patient reports the following symptoms related to hypoglycemia or hyperglycemia : No symptoms reported    Gastrointestinal Gastrointestinal Symptoms Reported: No symptoms reported      Genitourinary Genitourinary Symptoms Reported: No symptoms reported    Integumentary Integumentary Symptoms Reported: No symptoms reported    Musculoskeletal Musculoskelatal Symptoms Reviewed: No symptoms reported   Falls in the past year?: Yes (Fell while mowing the lawn, slipped on gravel) Number of falls in past year: 1 or less Was there an injury with Fall?: No Fall Risk Category Calculator: 1 Patient Fall Risk Level: Low Fall Risk    Psychosocial Psychosocial Symptoms Reported: Depression - if selected complete PHQ 2-9, Anxiety - if selected complete GAD Behavioral Health Conditions: Anxiety, Depression Behavioral Management Strategies: Adequate rest, Medication therapy Major Change/Loss/Stressor/Fears (CP): Medical  condition, self, Medical condition, family Techniques to [place] with Loss/Stress/Change: Medication Quality of Family Relationships: involved Do you feel physically threatened by others?: No      10/11/2023   10:22 AM  Depression screen PHQ 2/9  Decreased Interest 0  Down, Depressed, Hopeless 0  PHQ - 2 Score 0  Altered sleeping 0  Tired, decreased energy 0  Change in appetite 0  Feeling bad or failure about yourself  0  Trouble concentrating 0  Moving slowly or fidgety/restless 0  Suicidal thoughts 0  PHQ-9 Score 0  Difficult doing work/chores Not difficult at all    There were no vitals filed for this visit.  Medications Reviewed Today     Reviewed by Daanya Lanphier D, LCSW (Social Worker) on 01/17/24 at 1301  Med List Status: <None>   Medication Order Taking? Sig Documenting Provider Last Dose Status Informant  alendronate (FOSAMAX) 70 MG tablet [id number] Yes TAKE 1 TABLET (70 MG TOTAL) BY MOUTH EVERY SUNDAY. Cleave Curling, MD Taking Active   ALPRAZolam  (XANAX ) 1 MG tablet [id number] Yes Take 1 mg by mouth 3 (three) times daily as needed for anxiety or sleep. [provider] Taking Active Self  amLODipine  (NORVASC ) 10 MG tablet [id number] Yes TAKE 1 TABLET BY MOUTH EVERY DAY Maureen Sos, MD Taking Active   atorvastatin  (LIPITOR) 10 MG tablet [id number] Yes TAKE 1 TABLET BY MOUTH EVERY DAY Maureen Sos, MD Taking Active   Calcium  Carbonate-Vit D-Min (CALCIUM  1200 PO) [id number] Yes Take 1 tablet by mouth daily. [provider] Taking Active Self  Carboxymethylcellulose Sodium (EYE DROPS OP) [id number] Yes Place 1 drop into both eyes 3 (three) times daily as needed (dry eyes). [provider] Taking Active Self  cycloSPORINE (RESTASIS)  0.05 % ophthalmic emulsion [id number] Yes  [provider] Taking Active   DULoxetine  (CYMBALTA ) 60 MG capsule [id number] Yes Take 60 mg by mouth at bedtime. [provider] Taking Active Self   esomeprazole (NEXIUM) 40 MG capsule [id number] Yes Take 40 mg by mouth daily at 12 noon. [provider] Taking Active Self  fluticasone  (FLONASE ) 50 MCG/ACT nasal spray [id number] Yes Place 1 spray into both nostrils daily.  Patient taking differently: Place 1 spray into both nostrils daily as needed for allergies.   Susanna Epley, FNP Taking Active Self  Iron-FA-B Cmp-C-Biot-Probiotic (FUSION PLUS) CAPS [id number] Yes TAKE 1 CAPSULE BY MOUTH MONDAY THROUGH Alford Im, MD Taking Active   Magnesium 400 MG TABS [id number] Yes Take 1 tablet by mouth daily. [provider] Taking Active Self  Omega-3 Fatty Acids (FISH OIL) 1000 MG CAPS [id number] Yes Take 1,000 mg by mouth daily.  [provider] Taking Active Self  traZODone  (DESYREL ) 100 MG tablet [id number] Yes Take 100 mg by mouth at bedtime. [provider] Taking Active Self  Varenicline  Tartrate, Starter, 0.5 MG X 11 & 1 MG X 42 TBPK [id number] Yes STARTER PACK, USE AS DIRECTED Cleave Curling, MD Taking Active             Recommendation:   Continue utilizing strategies discussed to assist with symptom management  Follow Up Plan:   Telephone follow-up 2-4 weeks  Maureen Hunter, LCSW [facility]  [facility], [facility] Clinical Social Worker Direct Dial: [phone]  Fax: [phone] Website: [email or web address] 2:02 PM

## 2024-02-07 ENCOUNTER — Telehealth: Admitting: Licensed Clinical Social Worker

## 2024-02-09 ENCOUNTER — Other Ambulatory Visit: Payer: Self-pay | Admitting: Internal Medicine

## 2024-02-12 ENCOUNTER — Other Ambulatory Visit: Payer: Self-pay | Admitting: Internal Medicine

## 2024-02-29 ENCOUNTER — Telehealth: Payer: Self-pay | Admitting: Licensed Clinical Social Worker

## 2024-03-05 DIAGNOSIS — F411 Generalized anxiety disorder: Secondary | ICD-10-CM | POA: Diagnosis not present

## 2024-03-05 DIAGNOSIS — F331 Major depressive disorder, recurrent, moderate: Secondary | ICD-10-CM | POA: Diagnosis not present

## 2024-03-06 ENCOUNTER — Telehealth: Payer: Self-pay

## 2024-03-06 NOTE — Progress Notes (Signed)
 Pt has been rescheduled 04/11/24.  Thanks!

## 2024-03-14 ENCOUNTER — Other Ambulatory Visit: Payer: Self-pay | Admitting: Licensed Clinical Social Worker

## 2024-03-14 NOTE — Patient Outreach (Addendum)
 Complex Care Management   Visit Note  03/14/2024  Name:  Maureen Rodgers MRN: [id number] DOB: 1951/07/02  Situation: Referral received for Complex Care Management related to Substance Abuse/Misuse and depressive symptoms. I obtained verbal consent from Patient.  Visit completed with VBCI LCSW  on the phone  SDOH Screenings   Food Insecurity: No Food Insecurity (03/14/2024)  Housing: Low Risk  (03/14/2024)  Transportation Needs: No Transportation Needs (03/14/2024)  Utilities: Not At Risk (01/17/2024)  Alcohol Screen: Medium Risk (03/14/2024)  Depression (PHQ2-9): Medium Risk (03/14/2024)  Financial Resource Strain: Low Risk  (03/14/2024)  Physical Activity: Inactive (03/14/2024)  Social Connections: Socially Isolated (03/14/2024)  Stress: Stress Concern Present (03/14/2024)  Tobacco Use: High Risk (10/11/2023)  Health Literacy: Adequate Health Literacy (04/13/2023)   Background:   Past Medical History:  Diagnosis Date   Anxiety 06/07/2023   Ascending aortic aneurysm (HCC) 11/29/2021   Depression    Essential hypertension 11/29/2021   GERD (gastroesophageal reflux disease)    Tobacco use     Assessment: Patient Reported Symptoms:  Cognitive Cognitive Status: Alert and oriented to person, place, and time Cognitive/Intellectual Conditions Management [RPT]: None reported or documented in medical history or problem list   Health Maintenance Behaviors: Annual physical exam Healing Pattern: Average Health Facilitated by: Rest  Neurological Neurological Review of Symptoms: No symptoms reported    HEENT HEENT Symptoms Reported: No symptoms reported      Cardiovascular  No Symptoms Reported     Respiratory Respiratory Symptoms Reported: No symptoms reported    Endocrine    No Symptoms Reported   Gastrointestinal    No Symptoms Reported     Genitourinary    No Symptoms Reported   Integumentary    No Symptoms Reported   Musculoskeletal  No Symptoms Reported          Psychosocial Psychosocial Symptoms Reported: Sadness - if selected complete PHQ 2-9, Substance use Additional Psychological Details: Patient admits ongoing alcohol usage , limit social support, isolation and depressive symptoms. Patient reports that she completed a recent psychiatry visit by phone on 03/05/24 with her same provider. She reports that she meets with her counselor Sunny English regularly but needs to make a new appointment. Patient denied wanting inpatient SA treatment at this time but is open to considering AA meetings. Behavioral Health Conditions: Substance use, Depression Substance Use: Alcohol Behavioral Management Strategies: Adequate rest, Coping strategies, Medication therapy Behavioral Health Self-Management Outcome: 3 (uncertain) Major Change/Loss/Stressor/Fears (CP): Medical condition, self Behaviors When Feeling Stressed/Fearful: Sadness Techniques to Cope with Loss/Stress/Change: Substance use, Withdraw Quality of Family Relationships: non-existent Do you feel physically threatened by others?: No      03/14/2024   10:45 AM  Depression screen PHQ 2/9  Decreased Interest 3  Down, Depressed, Hopeless 1  PHQ - 2 Score 4  Altered sleeping 1  Tired, decreased energy 1  Change in appetite 0  Feeling bad or failure about yourself  0  Trouble concentrating 0  Moving slowly or fidgety/restless 0  Suicidal thoughts 0  PHQ-9 Score 6  Difficult doing work/chores Somewhat difficult      03/14/2024   10:46 AM 10/11/2023   10:22 AM  GAD 7 : Generalized Anxiety Score  Nervous, Anxious, on Edge 1 0  Control/stop worrying 0 0  Worry too much - different things 0 0  Trouble relaxing 0 0  Restless 0 0  Easily annoyed or irritable 0 0  Afraid - awful might happen 0 0  Total GAD 7  Score 1 0  Anxiety Difficulty Not difficult at all Not difficult at all   There were no vitals filed for this visit.  Medications Reviewed Today     Reviewed by Elie Grove, LCSW (Social  Worker) on 03/14/24 at 1015  Med List Status: <None>   Medication Order Taking? Sig Documenting Provider Last Dose Status Informant  alendronate (FOSAMAX) 70 MG tablet [id number] No TAKE 1 TABLET (70 MG TOTAL) BY MOUTH EVERY SUNDAY. Cleave Curling, MD Taking Active   ALPRAZolam  (XANAX ) 1 MG tablet [id number] No Take 1 mg by mouth 3 (three) times daily as needed for anxiety or sleep. [provider] Taking Active Self  amLODipine  (NORVASC ) 10 MG tablet [id number] No TAKE 1 TABLET BY MOUTH EVERY DAY [name], MD Taking Active   atorvastatin  (LIPITOR) 10 MG tablet [id number] No TAKE 1 TABLET BY MOUTH EVERY DAY [name], MD Taking Active   Calcium  Carbonate-Vit D-Min (CALCIUM  1200 PO) [id number] No Take 1 tablet by mouth daily. [provider] Taking Active Self  Carboxymethylcellulose Sodium (EYE DROPS OP) [id number] No Place 1 drop into both eyes 3 (three) times daily as needed (dry eyes). [provider] Taking Active Self  cycloSPORINE (RESTASIS) 0.05 % ophthalmic emulsion [id number] No  [provider] Taking Active   DULoxetine  (CYMBALTA ) 60 MG capsule [id number] No Take 60 mg by mouth at bedtime. [provider] Taking Active Self  esomeprazole (NEXIUM) 40 MG capsule [id number] No Take 40 mg by mouth daily at 12 noon. [provider] Taking Active Self  fluticasone  (FLONASE ) 50 MCG/ACT nasal spray [id number] No Place 1 spray into both nostrils daily.  Patient taking differently: Place 1 spray into both nostrils daily as needed for allergies.   Susanna Epley, FNP Taking Active Self  Iron-FA-B Cmp-C-Biot-Probiotic (FUSION PLUS) CAPS [id number]  TAKE 1 CAPSULE BY MOUTH MONDAY THROUGH Alford Im, MD  Active   Magnesium 400 MG TABS [id number] No Take 1 tablet by mouth daily. [provider] Taking Active Self  Omega-3 Fatty Acids (FISH OIL) 1000 MG CAPS [id number] No Take 1,000 mg by mouth daily.  [provider]  Taking Active Self  traZODone  (DESYREL ) 100 MG tablet [id number] No Take 100 mg by mouth at bedtime. [provider] Taking Active Self  Varenicline  Tartrate, Starter, 0.5 MG X 11 & 1 MG X 42 TBPK [id number]  STARTER PACK, USE AS DIRECTED Cleave Curling, MD  Active             Recommendation:   PCP Follow-up Specialty provider follow-up Therapist Sunny English Continue Current Plan of Care  Follow Up Plan:   Telephone follow-up on 04/08/24 at 10:00 am  Kolleen Perone, BSW, MSW, LCSW Licensed Clinical Social Worker [employer]   [facility] [email or web address] Direct Dial: [phone]

## 2024-03-14 NOTE — Patient Instructions (Signed)
 Visit Information  Thank you for taking time to visit with me today. Please don't hesitate to contact me if I can be of assistance to you before our next scheduled appointment.  Your next care management appointment is by telephone on 04/08/24 at 10 am  Please call the care guide team at [phone] if you need to cancel, schedule, or reschedule an appointment.   Please call the Suicide and Crisis Lifeline: 988 call 1-800-273-TALK (toll free, 24 hour hotline) go to [facility] [address], [place] [phone]) if you are experiencing a Mental Health or Behavioral Health Crisis or need someone to talk to.  Kolleen Perone, BSW, MSW, LCSW Licensed Clinical Social Worker [employer]   [facility] [email or web address] Direct Dial: [phone]

## 2024-04-03 DIAGNOSIS — Z961 Presence of intraocular lens: Secondary | ICD-10-CM | POA: Diagnosis not present

## 2024-04-03 DIAGNOSIS — H0102A Squamous blepharitis right eye, upper and lower eyelids: Secondary | ICD-10-CM | POA: Diagnosis not present

## 2024-04-03 DIAGNOSIS — H0288A Meibomian gland dysfunction right eye, upper and lower eyelids: Secondary | ICD-10-CM | POA: Diagnosis not present

## 2024-04-03 DIAGNOSIS — H5711 Ocular pain, right eye: Secondary | ICD-10-CM | POA: Diagnosis not present

## 2024-04-03 DIAGNOSIS — H0288B Meibomian gland dysfunction left eye, upper and lower eyelids: Secondary | ICD-10-CM | POA: Diagnosis not present

## 2024-04-03 DIAGNOSIS — H04123 Dry eye syndrome of bilateral lacrimal glands: Secondary | ICD-10-CM | POA: Diagnosis not present

## 2024-04-03 DIAGNOSIS — H26492 Other secondary cataract, left eye: Secondary | ICD-10-CM | POA: Diagnosis not present

## 2024-04-03 DIAGNOSIS — H0102B Squamous blepharitis left eye, upper and lower eyelids: Secondary | ICD-10-CM | POA: Diagnosis not present

## 2024-04-08 ENCOUNTER — Other Ambulatory Visit: Payer: Self-pay | Admitting: Licensed Clinical Social Worker

## 2024-04-08 NOTE — Patient Instructions (Signed)
 Visit Information  Thank you for taking time to visit with me today. Please don't hesitate to contact me if I can be of assistance to you before our next scheduled appointment.  Your next care management appointment is by telephone on 04/23/24 at 10:30am  Please call the care guide team at [phone] if you need to cancel, schedule, or reschedule an appointment.   Please call the Suicide and Crisis Lifeline: 988 go to [facility] [address] [place] [phone]) if you are experiencing a Mental Health or Behavioral Health Crisis or need someone to talk to.  Lyle Rung, BSW, MSW, LCSW Licensed Clinical Social Worker [employer]   [email or web address] Direct Dial: [phone]

## 2024-04-08 NOTE — Patient Outreach (Signed)
 Complex Care Management   Visit Note  04/08/2024  Name:  Maureen Rodgers MRN: [id number] DOB: 1951-04-14  Situation: Referral received for Complex Care Management related to depression I obtained verbal consent from Patient.  Visit completed with VBCI LCSW  on the phone  Background:   Past Medical History:  Diagnosis Date   Anxiety 06/07/2023   Ascending aortic aneurysm (HCC) 11/29/2021   Depression    Essential hypertension 11/29/2021   GERD (gastroesophageal reflux disease)    Tobacco use     Assessment: Patient Reported Symptoms:  Cognitive Cognitive Status: Alert and oriented to person, place, and time Cognitive/Intellectual Conditions Management [RPT]: None reported or documented in medical history or problem list      Neurological Neurological Review of Symptoms: No symptoms reported    HEENT HEENT Symptoms Reported: No symptoms reported      Cardiovascular Cardiovascular Symptoms Reported: No symptoms reported (CAD and Hypertension dx)    Respiratory Respiratory Symptoms Reported: No symptoms reported    Endocrine Endocrine Symptoms Reported: No symptoms reported    Gastrointestinal Gastrointestinal Symptoms Reported: No symptoms reported      Genitourinary Genitourinary Symptoms Reported: No symptoms reported    Integumentary Integumentary Symptoms Reported: No symptoms reported    Musculoskeletal Musculoskelatal Symptoms Reviewed: No symptoms reported        Psychosocial Psychosocial Symptoms Reported: Depression - if selected complete PHQ 2-9 Additional Psychological Details: Patient admits ongoing alcohol usage , limit social support, isolation and depressive symptoms. Patient reports that she completed a recent psychiatry visit by phone on 03/05/24 with her same provider. She reports that she meets with her counselor Richerd Kerns regularly but needs to make a new appointment. Patient denied wanting inpatient SA treatment at this time but is open to  considering AA meetings. Behavioral Management Strategies: Adequate rest, Medication therapy, Coping strategies Behavioral Health Self-Management Outcome: 3 (uncertain) Major Change/Loss/Stressor/Fears (CP): Medical condition, self Techniques to Cope with Loss/Stress/Change: Substance use, Withdraw Quality of Family Relationships: non-existent Do you feel physically threatened by others?: No      04/08/2024   10:20 AM  Depression screen PHQ 2/9  Decreased Interest 2  Down, Depressed, Hopeless 2  PHQ - 2 Score 4  Altered sleeping 1  Tired, decreased energy 1  Change in appetite 0  Feeling bad or failure about yourself  0  Trouble concentrating 0  Moving slowly or fidgety/restless 0  Suicidal thoughts 0  PHQ-9 Score 6  Difficult doing work/chores Somewhat difficult   SDOH Screenings   Food Insecurity: No Food Insecurity (04/08/2024)  Housing: Low Risk  (04/08/2024)  Transportation Needs: No Transportation Needs (04/08/2024)  Utilities: Not At Risk (01/17/2024)  Alcohol Screen: Medium Risk (04/08/2024)  Depression (PHQ2-9): Medium Risk (04/08/2024)  Financial Resource Strain: Low Risk  (03/14/2024)  Physical Activity: Inactive (03/14/2024)  Social Connections: Socially Isolated (03/14/2024)  Stress: Stress Concern Present (04/08/2024)  Tobacco Use: High Risk (10/11/2023)  Health Literacy: Adequate Health Literacy (04/13/2023)    There were no vitals filed for this visit.  Medications Reviewed Today     Reviewed by Merlynn Lyle CROME, LCSW (Social Worker) on 04/08/24 at 1212  Med List Status: <None>   Medication Order Taking? Sig Documenting Provider Last Dose Status Informant  alendronate (FOSAMAX) 70 MG tablet [id number] No TAKE 1 TABLET (70 MG TOTAL) BY MOUTH EVERY SUNDAY. Jarold Medici, MD Taking Active   ALPRAZolam  (XANAX ) 1 MG tablet [id number] No Take 1 mg by mouth 3 (three) times daily as needed for  anxiety or sleep. [provider] Taking Active Self  amLODipine  (NORVASC ) 10 MG  tablet [id number] No TAKE 1 TABLET BY MOUTH EVERY DAY Raford Riggs, MD Taking Active   atorvastatin  (LIPITOR) 10 MG tablet [id number] No TAKE 1 TABLET BY MOUTH EVERY DAY Raford Riggs, MD Taking Active   Calcium  Carbonate-Vit D-Min (CALCIUM  1200 PO) [id number] No Take 1 tablet by mouth daily. [provider] Taking Active Self  Carboxymethylcellulose Sodium (EYE DROPS OP) [id number] No Place 1 drop into both eyes 3 (three) times daily as needed (dry eyes). [provider] Taking Active Self  cycloSPORINE (RESTASIS) 0.05 % ophthalmic emulsion [id number] No  [provider] Taking Active   DULoxetine  (CYMBALTA ) 60 MG capsule [id number] No Take 60 mg by mouth at bedtime. [provider] Taking Active Self  esomeprazole (NEXIUM) 40 MG capsule [id number] No Take 40 mg by mouth daily at 12 noon. [provider] Taking Active Self  fluticasone  (FLONASE ) 50 MCG/ACT nasal spray [id number] No Place 1 spray into both nostrils daily.  Patient taking differently: Place 1 spray into both nostrils daily as needed for allergies.   Georgina Speaks, FNP Taking Active Self  Iron-FA-B Cmp-C-Biot-Probiotic (FUSION PLUS) CAPS [id number]  TAKE 1 CAPSULE BY MOUTH MONDAY THROUGH DARIL Jarold Medici, MD  Active   Magnesium 400 MG TABS [id number] No Take 1 tablet by mouth daily. [provider] Taking Active Self  Omega-3 Fatty Acids (FISH OIL) 1000 MG CAPS [id number] No Take 1,000 mg by mouth daily.  [provider] Taking Active Self  traZODone  (DESYREL ) 100 MG tablet [id number] No Take 100 mg by mouth at bedtime. [provider] Taking Active Self  Varenicline  Tartrate, Starter, 0.5 MG X 11 & 1 MG X 42 TBPK [id number]  STARTER PACK, USE AS DIRECTED Jarold Medici, MD  Active             Recommendation:   PCP Follow-up Continue Current Plan of Care Look out for community resources sent out by mail  Follow Up Plan:   Telephone follow-up in 1  month  Lyle Rung, BSW, MSW, LCSW Licensed Clinical Social Worker [employer] [facility]   [email or web address] Direct Dial: [phone]

## 2024-04-11 ENCOUNTER — Other Ambulatory Visit: Payer: Self-pay

## 2024-04-11 DIAGNOSIS — F331 Major depressive disorder, recurrent, moderate: Secondary | ICD-10-CM

## 2024-04-11 DIAGNOSIS — I10 Essential (primary) hypertension: Secondary | ICD-10-CM

## 2024-04-11 DIAGNOSIS — I25709 Atherosclerosis of coronary artery bypass graft(s), unspecified, with unspecified angina pectoris: Secondary | ICD-10-CM

## 2024-04-11 NOTE — Patient Outreach (Addendum)
 Complex Care Management   Visit Note  04/11/2024  Name:  Maureen Rodgers MRN: [id number] DOB: [birth year]  Situation: Referral received for Complex Care Management related to Hypertensive Heart disease without heart failure; Moderate episode of recurrent major depressive disorder; Cigarette nicotine dependence without complication; Alcohol use disorder, moderate, dependence; Essential Hypertension; chronic low back pain; dry eyes. I obtained verbal consent from Patient.  Visit completed with patient on the phone.  Background:   Past Medical History:  Diagnosis Date   Anxiety 06/07/2023   Ascending aortic aneurysm (HCC) 11/29/2021   Depression    Essential hypertension 11/29/2021   GERD (gastroesophageal reflux disease)    Tobacco use     Assessment: Patient Reported Symptoms:  Cognitive Cognitive Status: Alert and oriented to person, place, and time Cognitive/Intellectual Conditions Management [RPT]: None reported or documented in medical history or problem list   Health Maintenance Behaviors: Annual physical exam, Healthy diet Health Facilitated by: Healthy diet, Pain control  Neurological Neurological Review of Symptoms: Hearing changes Neurological Management Strategies: Routine screening Neurological Self-Management Outcome: 3 (uncertain) Neurological Comment: patient plans to discuss with PCP at next visit  HEENT HEENT Symptoms Reported: Change or loss of hearing, Eye dryness HEENT Management Strategies: Routine screening, Medication therapy, Medical device HEENT Self-Management Outcome: 4 (good)    Cardiovascular Cardiovascular Symptoms Reported: No symptoms reported Does patient have uncontrolled Hypertension?: No Is patient checking Blood Pressure at home?: No Patient's Recent BP reading at home: patient has a BP cuff but does not check at home, per patient preference Cardiovascular Management Strategies: Routine screening, Medication therapy Cardiovascular  Self-Management Outcome: 3 (uncertain)  Respiratory Respiratory Symptoms Reported: Dry cough Other Respiratory Symptoms: patient states she has a smokers cough Respiratory Management Strategies: Routine screening Respiratory Self-Management Outcome: 3 (uncertain)  Endocrine Endocrine Symptoms Reported: No symptoms reported    Gastrointestinal Gastrointestinal Symptoms Reported: Diarrhea, Change in appetite Additional Gastrointestinal Details: poor appetitie, never eats breakfast, life long issues with bowel patterns, patient is established with [facility] Gastrointestinal Management Strategies: Diet modification Gastrointestinal Self-Management Outcome: 4 (good)    Genitourinary Genitourinary Symptoms Reported: Frequency, Other Other Genitourinary Symptoms: nocturia Genitourinary Management Strategies: Fluid modification Genitourinary Self-Management Outcome: 4 (good)  Integumentary Integumentary Symptoms Reported: No symptoms reported    Musculoskeletal Musculoskelatal Symptoms Reviewed: Unsteady gait Additional Musculoskeletal Details: patient has a balance issue first thing in the morning Musculoskeletal Management Strategies: Routine screening Musculoskeletal Self-Management Outcome: 4 (good)      Psychosocial Psychosocial Symptoms Reported: Depression - if selected complete PHQ 2-9 Behavioral Management Strategies: Counseling, Medication therapy Behavioral Health Self-Management Outcome: 3 (uncertain) Major Change/Loss/Stressor/Fears (CP): Medical condition, self Techniques to Cope with Loss/Stress/Change: Substance use, Counseling, Medication Quality of Family Relationships: supportive Do you feel physically threatened by others?: No      04/11/2024   12:26 PM  Depression screen PHQ 2/9  Decreased Interest 2  Down, Depressed, Hopeless 2  PHQ - 2 Score 4  Altered sleeping 1  Tired, decreased energy 1  Change in appetite 0  Feeling bad or failure about yourself  0  Trouble  concentrating 0  Moving slowly or fidgety/restless 0  Suicidal thoughts 0  PHQ-9 Score 6  Difficult doing work/chores Somewhat difficult    There were no vitals filed for this visit.  Medications Reviewed Today     Reviewed by Morgan Clayborne CROME, RN (Registered Nurse) on 04/11/24 at 1206  Med List Status: <None>   Medication Order Taking? Sig Documenting Provider Last Dose Status Informant  acetaminophen  (  TYLENOL ) 325 MG tablet [id number] Yes Take 325 mg by mouth every 6 (six) hours as needed for moderate pain (pain score 4-6). [provider]  Active   acetaminophen  (TYLENOL ) 650 MG CR tablet [id number] Yes Take 650 mg by mouth as needed for pain. [provider]  Active   alendronate (FOSAMAX) 70 MG tablet [id number] Yes TAKE 1 TABLET (70 MG TOTAL) BY MOUTH EVERY SUNDAY. Jarold Medici, MD  Active   ALPRAZolam  (XANAX ) 1 MG tablet [id number] Yes Take 1 mg by mouth 3 (three) times daily as needed for anxiety or sleep. [provider]  Active Self  amLODipine  (NORVASC ) 10 MG tablet [id number] Yes TAKE 1 TABLET BY MOUTH EVERY DAY Raford Riggs, MD  Active   atorvastatin  (LIPITOR) 10 MG tablet [id number] Yes TAKE 1 TABLET BY MOUTH EVERY DAY Raford Riggs, MD  Active   Calcium  Carbonate-Vit D-Min (CALCIUM  1200 PO) [id number] Yes Take 1 tablet by mouth daily. Patient is taking 600 mg daily [provider]  Active Self  Carboxymethylcellulose Sodium (EYE DROPS OP) [id number]  Place 1 drop into both eyes 3 (three) times daily as needed (dry eyes).  Patient not taking: Reported on 04/11/2024   [provider]  Consider Medication Status and Discontinue (Completed Course) Self  cholecalciferol (VITAMIN D3) 25 MCG (1000 UNIT) tablet [id number] Yes Take 1,000 Units by mouth daily. [provider]  Active   cycloSPORINE (RESTASIS) 0.05 % ophthalmic emulsion [id number] Yes  [provider]  Active   DULoxetine  (CYMBALTA ) 60 MG capsule [id number] Yes  Take 60 mg by mouth at bedtime. [provider]  Active Self  esomeprazole (NEXIUM) 40 MG capsule [id number] Yes Take 40 mg by mouth daily at 12 noon. [provider]  Active Self  fluticasone  (FLONASE ) 50 MCG/ACT nasal spray [id number]  Place 1 spray into both nostrils daily.  Patient not taking: Reported on 04/11/2024   Georgina Speaks, FNP  Active Self  Iron-FA-B Cmp-C-Biot-Probiotic (FUSION PLUS) CAPS [id number] Yes TAKE 1 CAPSULE BY MOUTH MONDAY THROUGH FRIDAYS  Patient taking differently: Take 1 capsule by mouth every other day. TAKE 1 CAPSULE BY MOUTH MONDAY, WEDNESDAY, FRIDAY   Sanders, Robyn, MD  Active   Magnesium 400 MG TABS [id number] Yes Take 1 tablet by mouth daily. [provider]  Active Self  meloxicam (MOBIC) 15 MG tablet [id number] Yes Take 15 mg by mouth daily. [provider]  Active   Omega-3 Fatty Acids (FISH OIL) 1000 MG CAPS [id number] Yes Take 1,000 mg by mouth daily.  [provider]  Active Self  traZODone  (DESYREL ) 100 MG tablet [id number] Yes Take 100 mg by mouth at bedtime. [provider]  Active Self  Varenicline  Tartrate, Starter, 0.5 MG X 11 & 1 MG X 42 TBPK [id number]  STARTER PACK, USE AS DIRECTED  Patient not taking: Reported on 04/11/2024   Jarold Medici, MD  Active             Follow up Plan:  Next PCP appointment scheduled for: 04/24/24 at 10:40 AM Telephone follow up appointment with care management team member scheduled for: Friday, 05/10/24 at 2:30 PM The patient has been provided with contact information for the care management team and has been advised to call with any health related questions or concerns  Clayborne Ly RN BSN CCM [facility]  [facility], [facility] Nurse Care Coordinator  Direct Dial: [phone] Website: [email or web address]

## 2024-04-12 ENCOUNTER — Telehealth: Payer: Self-pay | Admitting: Pharmacist

## 2024-04-12 DIAGNOSIS — Z79899 Other long term (current) drug therapy: Secondary | ICD-10-CM

## 2024-04-12 NOTE — Patient Instructions (Addendum)
 Visit Information  Thank you for taking time to visit with me today. Please don't hesitate to contact me if I can be of assistance to you before our next scheduled appointment.  Our next appointment is by telephone on Friday, August 8 at 2:30 PM Please call the care guide team at [phone] if you need to cancel or reschedule your appointment.   Following is a copy of your care plan:   Goals Addressed             This Visit's Progress    COMPLETED: Grief - loss of close friends       Care Coordination Interventions: Evaluation of current treatment plan related to grief and patient's adherence to plan as established by provider Discussed patient continues to suffer from grieving over her mother and a niece who committed suicide when she was only 73 years old Active listening / Reflection utilized  Emotional Support Provided Discussed patient is receiving outpatient counseling for her depression and anxiety Discussed patient continues to work with Lyle Rung LCSW for grief counnseling  Reviewed medications with patient and discussed the importance of medication adherence Instructed patient to keep her doctor well informed of new symptoms or concerns         VBCI RN Care Plan related to Hypertension       Problems:  Chronic Disease Management support and education needs related to Hypertension   Goal: Over the next 90 days the Patient will continue to work with RN Care Manager and/or Social Worker to address care management and care coordination needs related to Hypertension  as evidenced by adherence to care management team scheduled appointments      Interventions:   Hypertension Interventions: Last practice recorded BP readings:  BP Readings from Last 3 Encounters:  10/11/23 110/70  06/07/23 130/79  04/13/23 108/76   Most recent eGFR/CrCl:  Lab Results  Component Value Date   EGFR 82 10/11/2023    No components found for: CRCL  Evaluation of current treatment  plan related to hypertension self management and patient's adherence to plan as established by provider Reviewed medications with patient and discussed importance of compliance Counseled on the importance of exercise goals with target of 150 minutes per week Advised patient, providing education and rationale, to monitor blood pressure daily and record, calling PCP for findings outside established parameters Provided education on prescribed diet low Sodium  Discussed plans with patient for ongoing nurse care management follow up and provided patient with direct contact information for nurse case management   Patient Self-Care Activities:  Attend all scheduled provider appointments Call pharmacy for medication refills 3-7 days in advance of running out of medications Call provider office for new concerns or questions  Take medications as prescribed   Work with the social worker to address care coordination needs and will continue to work with the clinical team to address health care and disease management related needs  Plan:  Next PCP appointment scheduled for: 04/24/24 at 10:40 AM Telephone follow up appointment with care management team member scheduled for: Friday, 05/10/24 at 2:30 PM The patient has been provided with contact information for the care management team and has been advised to call with any health related questions or concerns.      VBCI RN Care Plan related to physical inactivity and lack of motivation       Problems:  Chronic Disease Management support and education needs related to limited physical activity and lack of motivation  Goal: Over  the next 90 days the Patient will demonstrate Improved adherence to prescribed treatment plan for limited inactivity due to lack of motivation as evidenced by patient will establish an exercise regimen that consists of walking at least twice weekly   Interventions:   Evaluation of current treatment plan related to limited physical  activity due to lack of motivation, Limited social support and Substance abuse issues -  Nicotine and Alcohol dependence  self-management and patient's adherence to plan as established by provider Discussed with patient she remains to suffer from grief and depression, she feels this is causing her to have lack of motivation to socialize and or exercise  Discussed patient is receiving counseling, she is established with VBCI LCSW Brooke Joyce and is taking her prescribed medications exactly as prescribed Validated patient's feelings and provided active listening  Encouraged patient to consider using exercise as a means of relieving her stress and anxiety  Discussed having patient set realistic goals and start off with baby steps, discussed she may consider walking in her neighborhood for approximately 10 minutes starting out and or may consider walking up and down her driveway to help get started Instructed patient to notify her doctor of new or worsening symptoms  Discussed plans with patient for ongoing care management follow up and provided patient with direct contact information for care management team  Patient Self-Care Activities:  Attend all scheduled provider appointments Call pharmacy for medication refills 3-7 days in advance of running out of medications Call provider office for new concerns or questions  Take medications as prescribed   Work with the social worker to address care coordination needs and will continue to work with the clinical team to address health care and disease management related needs  Plan:  Next PCP appointment scheduled for: 04/24/24 at 10:40 AM Telephone follow up appointment with care management team member scheduled for: Friday, 05/10/24 at 2:30 PM The patient has been provided with contact information for the care management team and has been advised to call with any health related questions or concerns.          Please call 1-800-273-TALK (toll free, 24  hour hotline) if you are experiencing a Mental Health or Behavioral Health Crisis or need someone to talk to.  Patient verbalizes understanding of instructions and care plan provided today and agrees to view in MyChart. Active MyChart status and patient understanding of how to access instructions and care plan via MyChart confirmed with patient.     Clayborne Ly RN BSN CCM [place]  [facility], [facility] Nurse Care Coordinator  Direct Dial: [phone] Website: [email or web address]

## 2024-04-12 NOTE — Progress Notes (Signed)
   04/12/2024  Patient ID: Maureen Rodgers, female   DOB: September 13, 1951, 73 y.o.   MRN: [id number]   Patient was called regarding medication assistance with Restatis.  Unfortunately, she did not answer the phone. HIPAA compliant message was left on her voicemail.  Restasis may be available through [facility] for [facility].    Plan:  Call Patient back in 5-7 business days.   Maureen Rodgers, PharmD, BCACP Clinical Pharmacist [phone]

## 2024-04-15 ENCOUNTER — Other Ambulatory Visit: Payer: Self-pay | Admitting: Obstetrics & Gynecology

## 2024-04-15 DIAGNOSIS — Z1231 Encounter for screening mammogram for malignant neoplasm of breast: Secondary | ICD-10-CM

## 2024-04-17 ENCOUNTER — Telehealth: Payer: Self-pay | Admitting: Pharmacist

## 2024-04-17 DIAGNOSIS — Z79899 Other long term (current) drug therapy: Secondary | ICD-10-CM

## 2024-04-17 NOTE — Progress Notes (Signed)
 04/17/2024 Name: Maureen Rodgers MRN: [id number] DOB: 1951/07/10  Chief Complaint  Patient presents with   Medication Assistance    Restasis    Maureen Rodgers is a 73 y.o. year old female who presented for a telephone visit.   They were referred to the pharmacist by their PCP for assistance in managing medication access.  (Restasis)  Subjective:  Maureen Rodgers is a 73 year old female referred for help affording Restasis eye drops. She has a medical history including but not limited to:  hypertension, depression, hyperlipidemia, coronary artery disease, anxiety, atherosclerosis of aorta, and current tobacco use.  Care Team: Primary Care Provider: Jarold Medici, MD ; Next Scheduled Visit: 04/24/24   Medication Access/Adherence  Current Pharmacy:  [id number] [place], [place] - [address] [place] [address] Phone: [phone] Fax: [phone]  [id number] [place], [place] - [address] AT [facility] [address] [facility] [address] [place] [address] Phone: [phone] Fax: [phone]   Patient reports affordability concerns with their medications: Yes  Restasis Patient reports access/transportation concerns to their pharmacy: No  Patient reports adherence concerns with their medications:  No         Objective:  Lab Results  Component Value Date   HGBA1C 5.5 11/29/2022    Lab Results  Component Value Date   CREATININE 0.77 10/11/2023   BUN 9 10/11/2023   NA 140 10/11/2023   K 4.3 10/11/2023   CL 101 10/11/2023   CO2 22 10/11/2023    Lab Results  Component Value Date   CHOL 164 10/11/2023   HDL 107 10/11/2023   LDLCALC 47 10/11/2023   TRIG 43 10/11/2023   CHOLHDL 1.5 10/11/2023    Medications Reviewed Today     Reviewed by Jolee Cassius PARAS, RPH (Pharmacist) on 04/17/24 at 1139  Med List Status: <None>   Medication Order Taking? Sig Documenting  Provider Last Dose Status Informant  acetaminophen  (TYLENOL ) 325 MG tablet [id number] Yes Take 325 mg by mouth every 6 (six) hours as needed for moderate pain (pain score 4-6). [provider]  Active   acetaminophen  (TYLENOL ) 650 MG CR tablet [id number] Yes Take 650 mg by mouth as needed for pain. [provider]  Active   alendronate (FOSAMAX) 70 MG tablet [id number] Yes TAKE 1 TABLET (70 MG TOTAL) BY MOUTH EVERY SUNDAY. Jarold Medici, MD  Active   ALPRAZolam  (XANAX ) 1 MG tablet [id number] Yes Take 1 mg by mouth 3 (three) times daily as needed for anxiety or sleep. [provider]  Active Self  amLODipine  (NORVASC ) 10 MG tablet [id number] Yes TAKE 1 TABLET BY MOUTH EVERY DAY Raford Riggs, MD  Active   atorvastatin  (LIPITOR) 10 MG tablet [id number] Yes TAKE 1 TABLET BY MOUTH EVERY DAY Raford Riggs, MD  Active   calcium  carbonate (OS-CAL - DOSED IN MG OF ELEMENTAL CALCIUM ) 1250 (500 Ca) MG tablet [id number] Yes Take 1 tablet by mouth daily. [provider]  Active    Discontinued 04/17/24 1136 (Entry Error) Carboxymethylcellulose Sodium (EYE DROPS OP) [id number] Yes Place 1 drop into both eyes 3 (three) times daily as needed (dry eyes). [provider]  Active Self  cholecalciferol (VITAMIN D3) 25 MCG (1000 UNIT) tablet [id number] Yes Take 1,000 Units by mouth daily. [provider]  Active   cycloSPORINE (RESTASIS) 0.05 % ophthalmic emulsion [id number]   [provider]  Active   DULoxetine  (CYMBALTA ) 60 MG capsule [id number] Yes Take 60 mg by mouth at bedtime. [provider]  Active Self  esomeprazole (NEXIUM) 40 MG capsule [id number] Yes Take 40 mg by mouth daily at 12 noon. [provider]  Active Self   Patient not taking:   Discontinued 04/17/24 1139 (Completed Course) Iron-FA-B Cmp-C-Biot-Probiotic (FUSION PLUS) CAPS [id number] Yes TAKE 1 CAPSULE BY MOUTH MONDAY THROUGH DARIL Jarold Medici, MD  Active   Magnesium  400 MG TABS [id number] Yes Take 1 tablet by mouth daily. [provider]  Active Self  meloxicam (MOBIC) 15 MG tablet [id number] Yes Take 15 mg by mouth daily. [provider]  Active   Omega-3 Fatty Acids (FISH OIL) 1000 MG CAPS [id number] Yes Take 1,000 mg by mouth daily.  [provider]  Active Self  traZODone  (DESYREL ) 100 MG tablet [id number] Yes Take 100 mg by mouth at bedtime. [provider]  Active Self  Varenicline  Tartrate, Starter, 0.5 MG X 11 & 1 MG X 42 TBPK [id number]  STARTER PACK, USE AS DIRECTED  Patient not taking: Reported on 04/17/2024   Jarold Medici, MD  Active               Assessment/Plan:   Hypertension: - Currently controlled - Recommend to continue current therapy.    Hyperlipidemia/ASCVD Risk Reduction: - Currently controlled. LDL 47 - Recommend to continue current therapy     Meets financial criteria for [employer] program patient assistance program.   Will collaborate with provider, CPhT, and patient to pursue assistance.   Follow Up Plan:    Route note to RX Med Assistance Team Follow up in 3 weeks.  Cassius DOROTHA Brought, PharmD, BCACP Clinical Pharmacist [phone]

## 2024-04-18 ENCOUNTER — Telehealth: Payer: Self-pay

## 2024-04-18 NOTE — Telephone Encounter (Signed)
 PAP: Patient assistance application for Restasis through [employer]) has been mailed to pt's home address on file. Provider portion of application will be faxed to provider's office.

## 2024-04-22 ENCOUNTER — Ambulatory Visit
Admission: RE | Admit: 2024-04-22 | Discharge: 2024-04-22 | Disposition: A | Discharge status: Home / Self Care | Source: Ambulatory Visit | Attending: Obstetrics & Gynecology

## 2024-04-22 DIAGNOSIS — Z1231 Encounter for screening mammogram for malignant neoplasm of breast: Secondary | ICD-10-CM

## 2024-04-23 ENCOUNTER — Other Ambulatory Visit: Payer: Self-pay | Admitting: Licensed Clinical Social Worker

## 2024-04-23 NOTE — Patient Instructions (Signed)
 Visit Information  Thank you for taking time to visit with me today. Please don't hesitate to contact me if I can be of assistance to you before our next scheduled appointment.  Our next appointment is by telephone on 05/09/24 at 1030 AM Please call the care guide team at [phone] if you need to cancel or reschedule your appointment.   Following is a copy of your care plan:   Goals Addressed             This Visit's Progress    LCSW VBCI Social Work Care Plan   On track    Problems:   Disease Management support and education needs related to Anxiety with Excessive Worry, Panic Symptoms, and Alcohol Use Disorder  CSW Clinical Goal(s):   Over the next 90 days the Patient will attend all scheduled medical appointments as evidenced by patient report and care team review of appointment completion in electronic MEDICAL RECORD NUMBER  demonstrate a reduction in symptoms related to Anxiety with Excessive Worry, Panic Symptoms, Alcohol Use Disorder .  Interventions:  Mental Health:  Evaluation of current treatment plan related to Anxiety with Excessive Worry, Panic Symptoms, Active listening / Reflection utilized Behavioral Activation reviewed Emotional Support Provided Motivational Interviewing employed Participation in counseling encourage : Patient will schedule appt to re-establish with counselor, Richerd Kerns as patient has not yet done this yet. PHQ2/PHQ9 completed Provided general psycho-education for mental health needs Solution-Focued Strategies employed:  Patient continues to see psychiatrist Camelia [place] and completed her last visit on 03/05/24 Substance Misuse in a patient with Alcohol Use Disorder:   Evaluation of current treatment plan related to misuse of: alcohol Active listening / Reflection utilized Behavioral Activation reviewed Emotional Support Provided Participation in counseling encourage : Patient will schedule appt to re-establish counseling and  continue to meet telephonically with psychiatry. Provided general psycho-education for mental health needs Solution-Focued Strategies employed: Strategic planning for socialization events that she will be around alcohol. She reports recently going to her two family celebration events within the last 7 days.  LCSW educated patient on medication therapy that can help with alcohol triggers.  Crisis support resources reviewed as well. AA meeting list mailed to patient. Update- Patient reports that she did not receive list of resources that would like for this to be mailed out again to her. This was completed on 04/08/24. Patient was encouraged to write down local meetings, times and addresses that she can utilize in her area until she receives this list.   Patient Goals/Self-Care Activities:  Increase coping skills and healthy habits  Plan:   Telephone follow up appointment with care management team member scheduled for:  2-4 weeks        Please call the Suicide and Crisis Lifeline: 988 go to [facility] [address], [place] [phone]) if you are experiencing a Mental Health or Behavioral Health Crisis or need someone to talk to.  Patient verbalizes understanding of instructions and care plan provided today and agrees to view in MyChart. Active MyChart status and patient understanding of how to access instructions and care plan via MyChart confirmed with patient.     Lyle Rung, BSW, MSW, LCSW Licensed Clinical Social Worker [employer]   [email or web address] Direct Dial: [phone]

## 2024-04-23 NOTE — Patient Outreach (Signed)
 Complex Care Management   Visit Note  04/23/2024  Name:  Maureen Rodgers MRN: [id number] DOB: 07/09/1951  Situation: Referral received for Complex Care Management related to Alcohol Abuse I obtained verbal consent from Patient.  Visit completed with VBCI LCSW  on the phone  Background:   Past Medical History:  Diagnosis Date   Anxiety 06/07/2023   Ascending aortic aneurysm (HCC) 11/29/2021   Depression    Essential hypertension 11/29/2021   GERD (gastroesophageal reflux disease)    Tobacco use     Cognitive Cognitive Status: Alert and oriented to person, place, and time Cognitive/Intellectual Conditions Management [RPT]: None reported or documented in medical history or problem list   Health Maintenance Behaviors: Annual physical exam, Healthy diet Healing Pattern: Average Health Facilitated by: Healthy diet  Neurological Neurological Review of Symptoms: Hearing changes Neurological Management Strategies: Routine screening Neurological Self-Management Outcome: 3 (uncertain)  HEENT HEENT Symptoms Reported: Change or loss of hearing, Eye dryness HEENT Management Strategies: Routine screening, Medication therapy, Medical device HEENT Self-Management Outcome: 4 (good)    Cardiovascular Cardiovascular Symptoms Reported: No symptoms reported Does patient have uncontrolled Hypertension?: No Is patient checking Blood Pressure at home?: No Cardiovascular Management Strategies: Routine screening, Medication therapy Cardiovascular Self-Management Outcome: 3 (uncertain)  Respiratory Respiratory Symptoms Reported: Dry cough Other Respiratory Symptoms: Smoker's cough per pt Respiratory Management Strategies: Routine screening Respiratory Self-Management Outcome: 3 (uncertain)  Endocrine Endocrine Symptoms Reported: No symptoms reported    Gastrointestinal Gastrointestinal Symptoms Reported: Diarrhea, Change in appetite Additional Gastrointestinal Details: poor appetitie, never eats  breakfast, life long issues with bowel patterns, patient is established with [facility] Gastrointestinal Management Strategies: Diet modification Gastrointestinal Self-Management Outcome: 4 (good)    Genitourinary  No reported symptoms     Integumentary Integumentary Symptoms Reported: No symptoms reported    Musculoskeletal Musculoskelatal Symptoms Reviewed: Unsteady gait Musculoskeletal Management Strategies: Routine screening Musculoskeletal Self-Management Outcome: 4 (good)      Psychosocial Psychosocial Symptoms Reported: Substance use Additional Psychological Details: Patient admits ongoing alcohol usage , limit social support, isolation and depressive symptoms. Patient reports that she completed a recent psychiatry visit by phone on 03/05/24 with her same provider. She reports that she meets with her counselor Richerd Kerns regularly but needs to make a new appointment. Patient denied wanting inpatient SA treatment at this time but is open to considering AA meetings. She agreed to going to an AA meeting in the next 14 days. She will also consider inpatient tx again but pt reports concerned about pricing for this service. Behavioral Management Strategies: Counseling, Medication therapy Behavioral Health Self-Management Outcome: 3 (uncertain) Major Change/Loss/Stressor/Fears (CP): Medical condition, self Behaviors When Feeling Stressed/Fearful: Anxious Techniques to Cope with Loss/Stress/Change: Substance use, Counseling, Medication Quality of Family Relationships: supportive Do you feel physically threatened by others?: No      04/23/2024   12:48 PM  Depression screen PHQ 2/9  Decreased Interest 2  Down, Depressed, Hopeless 2  PHQ - 2 Score 4  Altered sleeping 1  Tired, decreased energy 1  Change in appetite 0  Feeling bad or failure about yourself  0  Trouble concentrating 0  Moving slowly or fidgety/restless 0  Suicidal thoughts 0  PHQ-9 Score 6  Difficult doing work/chores  Somewhat difficult      04/23/2024    1:08 PM 03/14/2024   10:46 AM 10/11/2023   10:22 AM  GAD 7 : Generalized Anxiety Score  Nervous, Anxious, on Edge 2 1 0  Control/stop worrying 1 0 0  Worry  too much - different things 0 0 0  Trouble relaxing 2 0 0  Restless 2 0 0  Easily annoyed or irritable 0 0 0  Afraid - awful might happen 0 0 0  Total GAD 7 Score 7 1 0  Anxiety Difficulty Somewhat difficult Not difficult at all Not difficult at all   SDOH Screenings   Food Insecurity: No Food Insecurity (04/23/2024)  Housing: Low Risk  (04/23/2024)  Transportation Needs: No Transportation Needs (04/08/2024)  Utilities: Not At Risk (01/17/2024)  Alcohol Screen: High Risk (04/23/2024)  Depression (PHQ2-9): Medium Risk (04/23/2024)  Financial Resource Strain: Low Risk  (04/23/2024)  Physical Activity: Inactive (03/14/2024)  Social Connections: Socially Isolated (03/14/2024)  Stress: No Stress Concern Present (04/23/2024)  Recent Concern: Stress - Stress Concern Present (04/08/2024)  Tobacco Use: High Risk (10/11/2023)  Health Literacy: Adequate Health Literacy (04/13/2023)   There were no vitals filed for this visit.  Medications Reviewed Today     Reviewed by Merlynn Lyle CROME, LCSW (Social Worker) on 04/23/24 at 1042  Med List Status: <None>   Medication Order Taking? Sig Documenting Provider Last Dose Status Informant  acetaminophen  (TYLENOL ) 325 MG tablet [id number]  Take 325 mg by mouth every 6 (six) hours as needed for moderate pain (pain score 4-6). [provider]  Active   acetaminophen  (TYLENOL ) 650 MG CR tablet [id number]  Take 650 mg by mouth as needed for pain. [provider]  Active   alendronate (FOSAMAX) 70 MG tablet [id number]  TAKE 1 TABLET (70 MG TOTAL) BY MOUTH EVERY SUNDAY. Jarold Medici, MD  Active   ALPRAZolam  (XANAX ) 1 MG tablet [id number]  Take 1 mg by mouth 3 (three) times daily as needed for anxiety or sleep. [provider]  Active Self  amLODipine   (NORVASC ) 10 MG tablet [id number]  TAKE 1 TABLET BY MOUTH EVERY DAY Raford Riggs, MD  Active   atorvastatin  (LIPITOR) 10 MG tablet [id number]  TAKE 1 TABLET BY MOUTH EVERY DAY Raford Riggs, MD  Active   calcium  carbonate (OS-CAL - DOSED IN MG OF ELEMENTAL CALCIUM ) 1250 (500 Ca) MG tablet [id number]  Take 1 tablet by mouth daily. [provider]  Active   Carboxymethylcellulose Sodium (EYE DROPS OP) [id number]  Place 1 drop into both eyes 3 (three) times daily as needed (dry eyes). [provider]  Active Self  cholecalciferol (VITAMIN D3) 25 MCG (1000 UNIT) tablet [id number]  Take 1,000 Units by mouth daily. [provider]  Active   cycloSPORINE (RESTASIS) 0.05 % ophthalmic emulsion [id number]   [provider]  Active   DULoxetine  (CYMBALTA ) 60 MG capsule [id number]  Take 60 mg by mouth at bedtime. [provider]  Active Self  esomeprazole (NEXIUM) 40 MG capsule [id number]  Take 40 mg by mouth daily at 12 noon. [provider]  Active Self  Iron-FA-B Cmp-C-Biot-Probiotic (FUSION PLUS) CAPS [id number]  TAKE 1 CAPSULE BY MOUTH MONDAY THROUGH DARIL Jarold Medici, MD  Active   Magnesium 400 MG TABS [id number]  Take 1 tablet by mouth daily. [provider]  Active Self  meloxicam (MOBIC) 15 MG tablet [id number]  Take 15 mg by mouth daily. [provider]  Active   Omega-3 Fatty Acids (FISH OIL) 1000 MG CAPS [id number]  Take 1,000 mg by mouth daily.  [provider]  Active Self  traZODone  (DESYREL ) 100 MG tablet [id number]  Take 100 mg by mouth at bedtime. [provider]  Active Self  Varenicline  Tartrate, Starter, 0.5 MG X  11 & 1 MG X 42 TBPK [id number]  STARTER PACK, USE AS DIRECTED  Patient not taking: Reported on 04/17/2024   Jarold Medici, MD  Active             Recommendation:   PCP Follow-up Continue Current Plan of Care Attend AA within 14 days (7 days suggested but patient wishes to make small  goals in order to prevent being overwhelmed.   Follow Up Plan:   Telephone follow-up in 1 month  Lyle Rung, BSW, MSW, LCSW Licensed Clinical Social Worker [employer]   [facility] [email or web address] Direct Dial: [phone]

## 2024-04-24 ENCOUNTER — Ambulatory Visit: Payer: Self-pay

## 2024-04-24 ENCOUNTER — Encounter: Payer: Self-pay | Admitting: Internal Medicine

## 2024-04-24 ENCOUNTER — Ambulatory Visit (INDEPENDENT_AMBULATORY_CARE_PROVIDER_SITE_OTHER): Payer: Self-pay | Admitting: Internal Medicine

## 2024-04-24 VITALS — BP 126/80 | HR 90 | Temp 98.5°F | Ht 62.0 in | Wt 130.0 lb

## 2024-04-24 DIAGNOSIS — I119 Hypertensive heart disease without heart failure: Secondary | ICD-10-CM | POA: Diagnosis not present

## 2024-04-24 DIAGNOSIS — Z0000 Encounter for general adult medical examination without abnormal findings: Secondary | ICD-10-CM | POA: Diagnosis not present

## 2024-04-24 DIAGNOSIS — I700 Atherosclerosis of aorta: Secondary | ICD-10-CM

## 2024-04-24 DIAGNOSIS — R413 Other amnesia: Secondary | ICD-10-CM | POA: Diagnosis not present

## 2024-04-24 DIAGNOSIS — I2510 Atherosclerotic heart disease of native coronary artery without angina pectoris: Secondary | ICD-10-CM

## 2024-04-24 DIAGNOSIS — F17210 Nicotine dependence, cigarettes, uncomplicated: Secondary | ICD-10-CM

## 2024-04-24 DIAGNOSIS — F1020 Alcohol dependence, uncomplicated: Secondary | ICD-10-CM

## 2024-04-24 DIAGNOSIS — F331 Major depressive disorder, recurrent, moderate: Secondary | ICD-10-CM

## 2024-04-24 NOTE — Patient Instructions (Signed)
 Hypertension, Adult Hypertension is another name for high blood pressure. High blood pressure forces your heart to work harder to pump blood. This can cause problems over time. There are two numbers in a blood pressure reading. There is a top number (systolic) over a bottom number (diastolic). It is best to have a blood pressure that is below 120/80. What are the causes? The cause of this condition is not known. Some other conditions can lead to high blood pressure. What increases the risk? Some lifestyle factors can make you more likely to develop high blood pressure: Smoking. Not getting enough exercise or physical activity. Being overweight. Having too much fat, sugar, calories, or salt (sodium) in your diet. Drinking too much alcohol. Other risk factors include: Having any of these conditions: Heart disease. Diabetes. High cholesterol. Kidney disease. Obstructive sleep apnea. Having a family history of high blood pressure and high cholesterol. Age. The risk increases with age. Stress. What are the signs or symptoms? High blood pressure may not cause symptoms. Very high blood pressure (hypertensive crisis) may cause: Headache. Fast or uneven heartbeats (palpitations). Shortness of breath. Nosebleed. Vomiting or feeling like you may vomit (nauseous). Changes in how you see. Very bad chest pain. Feeling dizzy. Seizures. How is this treated? This condition is treated by making healthy lifestyle changes, such as: Eating healthy foods. Exercising more. Drinking less alcohol. Your doctor may prescribe medicine if lifestyle changes do not help enough and if: Your top number is above 130. Your bottom number is above 80. Your personal target blood pressure may vary. Follow these instructions at home: Eating and drinking  If told, follow the DASH eating plan. To follow this plan: Fill one half of your plate at each meal with fruits and vegetables. Fill one fourth of your plate  at each meal with whole grains. Whole grains include whole-wheat pasta, brown rice, and whole-grain bread. Eat or drink low-fat dairy products, such as skim milk or low-fat yogurt. Fill one fourth of your plate at each meal with low-fat (lean) proteins. Low-fat proteins include fish, chicken without skin, eggs, beans, and tofu. Avoid fatty meat, cured and processed meat, or chicken with skin. Avoid pre-made or processed food. Limit the amount of salt in your diet to less than 1,500 mg each day. Do not drink alcohol if: Your doctor tells you not to drink. You are pregnant, may be pregnant, or are planning to become pregnant. If you drink alcohol: Limit how much you have to: 0-1 drink a day for women. 0-2 drinks a day for men. Know how much alcohol is in your drink. In the [place] one drink equals one 12 oz bottle of beer (355 mL), one 5 oz glass of wine (148 mL), or one 1 oz glass of hard liquor (44 mL). Lifestyle  Work with your doctor to stay at a healthy weight or to lose weight. Ask your doctor what the best weight is for you. Get at least 30 minutes of exercise that causes your heart to beat faster (aerobic exercise) most days of the week. This may include walking, swimming, or biking. Get at least 30 minutes of exercise that strengthens your muscles (resistance exercise) at least 3 days a week. This may include lifting weights or doing Pilates. Do not smoke or use any products that contain nicotine or tobacco. If you need help quitting, ask your doctor. Check your blood pressure at home as told by your doctor. Keep all follow-up visits. Medicines Take over-the-counter and prescription medicines  only as told by your doctor. Follow directions carefully. Do not skip doses of blood pressure medicine. The medicine does not work as well if you skip doses. Skipping doses also puts you at risk for problems. Ask your doctor about side effects or reactions to medicines that you should watch  for. Contact a doctor if: You think you are having a reaction to the medicine you are taking. You have headaches that keep coming back. You feel dizzy. You have swelling in your ankles. You have trouble with your vision. Get help right away if: You get a very bad headache. You start to feel mixed up (confused). You feel weak or numb. You feel faint. You have very bad pain in your: Chest. Belly (abdomen). You vomit more than once. You have trouble breathing. These symptoms may be an emergency. Get help right away. Call 911. Do not wait to see if the symptoms will go away. Do not drive yourself to the hospital. Summary Hypertension is another name for high blood pressure. High blood pressure forces your heart to work harder to pump blood. For most people, a normal blood pressure is less than 120/80. Making healthy choices can help lower blood pressure. If your blood pressure does not get lower with healthy choices, you may need to take medicine. This information is not intended to replace advice given to you by your health care provider. Make sure you discuss any questions you have with your health care provider. Document Revised: 07/08/2021 Document Reviewed: 07/08/2021 Elsevier Patient Education  2024 [employer].

## 2024-04-24 NOTE — Progress Notes (Signed)
 I,Victoria T Emmitt, CMA,acting as a Neurosurgeon for Catheryn LOISE Slocumb, MD.,have documented all relevant documentation on the behalf of Catheryn LOISE Slocumb, MD,as directed by  Catheryn LOISE Slocumb, MD while in the presence of Catheryn LOISE Slocumb, MD.  Subjective:  Patient ID: Maureen Rodgers , female    DOB: 1951-07-23 , 73 y.o.   MRN: [id number]  Chief Complaint  Patient presents with   Hypertension    Patient presents today for bpc. She reports compliance with medications. Denies headache, chest pain & sob.     HPI Discussed the use of AI scribe software for clinical note transcription with the patient, who gave verbal consent to proceed.  She is also scheduled for AWV with [facility].   History of Present Illness Maureen Rodgers is a 73 year old female who presents for a blood pressure check and Medicare visit.  She takes amlodipine  10 mg daily, atorvastatin  10 mg, duloxetine  60 mg, and esomeprazole 40 mg. She has stopped taking Restasis  due to cost and uses a cheaper alternative. She takes meloxicam daily for pain but cannot recall the specific reason for its use. She acknowledges memory issues.  She experiences a lack of appetite, which she attributes to her depression. This has led to decreased activity and social interaction. She lives alone and is working with a Warden/ranger, Richerd Kerns. Her depression has worsened, affecting her desire to engage in activities and her energy levels. She continues to smoke and drink beer, and her physical activity is currently zero. She has attempted exercise programs but was unable to complete them due to shoulder issues.  She recently attended a family event, which she describes as a 'baby step' towards social interaction. She has had a recent mammogram and is due for a low dose CT scan in January next year. She wants to improve her physical activity but feels unable to initiate it.   Hypertension This is a chronic problem. The current episode started more than 1  year ago. The problem has been gradually improving since onset. The problem is controlled. Risk factors for coronary artery disease include post-menopausal state, smoking/tobacco exposure and dyslipidemia. Past treatments include calcium  channel blockers. The current treatment provides moderate improvement. Compliance problems include exercise.      Past Medical History:  Diagnosis Date   Anxiety 06/07/2023   Ascending aortic aneurysm (HCC) 11/29/2021   Depression    Essential hypertension 11/29/2021   GERD (gastroesophageal reflux disease)    Tobacco use      Family History  Problem Relation Age of Onset   Heart disease Mother    Dementia Mother    Alcohol abuse Mother    Alcohol abuse Father    Heart disease Brother    Diabetes Brother    Heart attack Maternal Grandmother      Current Outpatient Medications:    acetaminophen  (TYLENOL ) 325 MG tablet, Take 325 mg by mouth every 6 (six) hours as needed for moderate pain (pain score 4-6)., Disp: , Rfl:    acetaminophen  (TYLENOL ) 650 MG CR tablet, Take 650 mg by mouth as needed for pain., Disp: , Rfl:    alendronate (FOSAMAX) 70 MG tablet, TAKE 1 TABLET (70 MG TOTAL) BY MOUTH EVERY SUNDAY., Disp: 12 tablet, Rfl: 2   ALPRAZolam  (XANAX ) 1 MG tablet, Take 1 mg by mouth 3 (three) times daily as needed for anxiety or sleep., Disp: , Rfl:    amLODipine  (NORVASC ) 10 MG tablet, TAKE 1 TABLET BY MOUTH EVERY DAY, Disp:  90 tablet, Rfl: 2   atorvastatin  (LIPITOR) 10 MG tablet, TAKE 1 TABLET BY MOUTH EVERY DAY, Disp: 90 tablet, Rfl: 1   calcium  carbonate (OS-CAL - DOSED IN MG OF ELEMENTAL CALCIUM ) 1250 (500 Ca) MG tablet, Take 1 tablet by mouth daily., Disp: , Rfl:    Carboxymethylcellulose Sodium (EYE DROPS OP), Place 1 drop into both eyes 3 (three) times daily as needed (dry eyes)., Disp: , Rfl:    cholecalciferol (VITAMIN D3) 25 MCG (1000 UNIT) tablet, Take 1,000 Units by mouth daily., Disp: , Rfl:    DULoxetine  (CYMBALTA ) 60 MG capsule, Take  60 mg by mouth at bedtime., Disp: , Rfl:    esomeprazole (NEXIUM) 40 MG capsule, Take 40 mg by mouth daily at 12 noon., Disp: , Rfl:    Iron-FA-B Cmp-C-Biot-Probiotic (FUSION PLUS) CAPS, TAKE 1 CAPSULE BY MOUTH MONDAY THROUGH FRIDAYS, Disp: 30 capsule, Rfl: 3   Magnesium 400 MG TABS, Take 1 tablet by mouth daily., Disp: , Rfl:    meloxicam (MOBIC) 15 MG tablet, Take 15 mg by mouth daily., Disp: , Rfl:    Omega-3 Fatty Acids (FISH OIL) 1000 MG CAPS, Take 1,000 mg by mouth daily. , Disp: , Rfl:    traZODone  (DESYREL ) 100 MG tablet, Take 100 mg by mouth at bedtime., Disp: , Rfl:    Varenicline  Tartrate, Starter, 0.5 MG X 11 & 1 MG X 42 TBPK, STARTER PACK, USE AS DIRECTED, Disp: 53 each, Rfl: 0   No Known Allergies   Review of Systems  Constitutional: Negative.   Respiratory: Negative.    Cardiovascular: Negative.   Gastrointestinal: Negative.   Neurological: Negative.   Psychiatric/Behavioral:  Positive for dysphoric mood.        She states she is now having memory issues. Does also admit to feeling more depressed.      Today's Vitals   04/24/24 1031  BP: 126/80  Pulse: 90  Temp: 98.5 F (36.9 C)  SpO2: 98%  Weight: 130 lb (59 kg)  Height: 5' 2 (1.575 m)   Body mass index is 23.78 kg/m.  Wt Readings from Last 3 Encounters:  04/24/24 130 lb (59 kg)  04/24/24 130 lb (59 kg)  10/11/23 128 lb 6.4 oz (58.2 kg)     Objective:  Physical Exam Vitals and nursing note reviewed.  Constitutional:      Appearance: Normal appearance.  HENT:     Head: Normocephalic and atraumatic.  Eyes:     Extraocular Movements: Extraocular movements intact.  Cardiovascular:     Rate and Rhythm: Normal rate and regular rhythm.     Heart sounds: Normal heart sounds.  Pulmonary:     Effort: Pulmonary effort is normal.     Breath sounds: Normal breath sounds.  Musculoskeletal:     Cervical back: Normal range of motion.  Skin:    General: Skin is warm.  Neurological:     General: No focal  deficit present.     Mental Status: She is alert.  Psychiatric:        Mood and Affect: Mood normal.        Behavior: Behavior normal.         Assessment And Plan:  Hypertensive heart disease without heart failure Assessment & Plan: Chronic, well controlled. She will c/w amlodipine  10mg  daily. She is encouraged to follow low sodium diet.  She will f/u in six months for re-evaluation.   Orders: -     CBC -     CMP14+EGFR  Coronary artery disease involving native coronary artery of native heart without angina pectoris Assessment & Plan: Chronic, LDL goal < 70.  She will c/w atorvastatin  10mg  daily. She is encouraged to follow heart healthy lifestyle.  - Consider anti-platelet therapy; however, would be at increased risk of gastritis due to regular alcohol intake.   Atherosclerosis of aorta (HCC) Assessment & Plan: Chronic, LDL goal < 70.  She will c/w atorvastatin  10mg  daily. She is encouraged to follow heart healthy lifestyle.    Moderate episode of recurrent major depressive disorder (HCC) Assessment & Plan: Worsening depression noted. Psychiatric evaluation needed for medication management. - Refer to psychiatrist for medication management. - Order thyroid  function test.   Cigarette nicotine dependence without complication Assessment & Plan: She has greater than 30 pack year history of tobacco use. Her last LDCT was in January 2025, next one due January 2026.   Alcohol use disorder, moderate, dependence (HCC) Assessment & Plan: Chronic, not currently on medication therapy. Has been on Antabuse in the past. Currently followed by [facility], she has now agreed to attending AA meetings.   Memory changes Assessment & Plan: Initiated workup for memory issues, including B12 and thyroid  levels. - Order B12 level and thyroid  function test. - Administer B12 injections weekly for four weeks if B12 is low, then monthly, and eventually quarterly.  Orders: -     Vitamin B12 -      TSH   Return for 6 month bpc..  Patient was given opportunity to ask questions. Patient verbalized understanding of the plan and was able to repeat key elements of the plan. All questions were answered to their satisfaction.   I, Catheryn LOISE Slocumb, MD, have reviewed all documentation for this visit. The documentation on 04/24/24 for the exam, diagnosis, procedures, and orders are all accurate and complete.  IF YOU HAVE BEEN REFERRED TO A SPECIALIST, IT MAY TAKE 1-2 WEEKS TO SCHEDULE/PROCESS THE REFERRAL. IF YOU HAVE NOT HEARD FROM US /SPECIALIST IN TWO WEEKS, PLEASE GIVE US  A CALL AT [phone].   THE PATIENT IS ENCOURAGED TO PRACTICE SOCIAL DISTANCING DUE TO THE COVID-19 PANDEMIC.

## 2024-04-24 NOTE — Patient Instructions (Signed)
 Maureen Rodgers , Thank you for taking time out of your busy schedule to complete your Annual Wellness Visit with me. I enjoyed our conversation and look forward to speaking with you again next year. I, as well as your care team,  appreciate your ongoing commitment to your health goals. Please review the following plan we discussed and let me know if I can assist you in the future. Your Game plan/ To Do List    Referrals: If you haven't heard from the office you've been referred to, please reach out to them at the phone provided.  N/a Follow up Visits: Next Medicare AWV with our clinical staff: office will schedule   Have you seen your provider in the last 6 months (3 months if uncontrolled diabetes)? Yes Next Office Visit with your provider: 04/24/2024  Clinician Recommendations:  Aim for 30 minutes of exercise or brisk walking, 6-8 glasses of water, and 5 servings of fruits and vegetables each day.       This is a list of the screening recommended for you and due dates:  Health Maintenance  Topic Date Due   COVID-19 Vaccine (5 - 2024-25 season) 12/04/2023   Flu Shot  05/03/2024   Screening for Lung Cancer  10/26/2024   Mammogram  03/01/2025   Medicare Annual Wellness Visit  04/24/2025   DTaP/Tdap/Td vaccine (2 - Td or Tdap) 03/03/2030   Colon Cancer Screening  11/27/2031   Pneumococcal Vaccine for age over 45  Completed   DEXA scan (bone density measurement)  Completed   Hepatitis C Screening  Completed   Zoster (Shingles) Vaccine  Completed   Hepatitis B Vaccine  Aged Out   HPV Vaccine  Aged Out   Meningitis B Vaccine  Aged Out    Advanced directives: (Declined) Advance directive discussed with you today. Even though you declined this today, please call our office should you change your mind, and we can give you the proper paperwork for you to fill out. Advance Care Planning is important because it:  [x]  Makes sure you receive the medical care that is consistent with your values,  goals, and preferences  [x]  It provides guidance to your family and loved ones and reduces their decisional burden about whether or not they are making the right decisions based on your wishes.  Follow the link provided in your after visit summary or read over the paperwork we have mailed to you to help you started getting your Advance Directives in place. If you need assistance in completing these, please reach out to us  so that we can help you!  See attachments for Preventive Care and Fall Prevention Tips.

## 2024-04-24 NOTE — Progress Notes (Signed)
 Subjective:   Maureen Rodgers is a 73 y.o. who presents for a Medicare Wellness preventive visit.  As a reminder, Annual Wellness Visits don't include a physical exam, and some assessments may be limited, especially if this visit is performed virtually. We may recommend an in-person follow-up visit with your provider if needed.  Visit Complete: In person    Persons Participating in Visit: Patient.  AWV Questionnaire: No: Patient Medicare AWV questionnaire was not completed prior to this visit.  Cardiac Risk Factors include: advanced age (>68men, >103 women);dyslipidemia;hypertension     Objective:    Today's Vitals   04/24/24 1052  BP: 126/80  Pulse: 90  Temp: 98.5 F (36.9 C)  TempSrc: Oral  SpO2: 98%  Weight: 130 lb (59 kg)  Height: 5' 2 (1.575 m)   Body mass index is 23.78 kg/m.     04/24/2024   11:01 AM 04/13/2023   11:14 AM 11/18/2022   10:25 PM 03/24/2022   11:38 AM 11/26/2021    9:27 AM 02/24/2021    3:53 PM 02/27/2020   10:30 AM  Advanced Directives  Does Patient Have a Medical Advance Directive? No No No No No No No  Would patient like information on creating a medical advance directive?  No - Patient declined No - Patient declined No - Patient declined Yes (MAU/Ambulatory/Procedural Areas - Information given) Yes (MAU/Ambulatory/Procedural Areas - Information given) No - Patient declined    Current Medications (verified) Outpatient Encounter Medications as of 04/24/2024  Medication Sig   acetaminophen  (TYLENOL ) 325 MG tablet Take 325 mg by mouth every 6 (six) hours as needed for moderate pain (pain score 4-6).   acetaminophen  (TYLENOL ) 650 MG CR tablet Take 650 mg by mouth as needed for pain.   alendronate (FOSAMAX) 70 MG tablet TAKE 1 TABLET (70 MG TOTAL) BY MOUTH EVERY SUNDAY.   ALPRAZolam  (XANAX ) 1 MG tablet Take 1 mg by mouth 3 (three) times daily as needed for anxiety or sleep.   amLODipine  (NORVASC ) 10 MG tablet TAKE 1 TABLET BY MOUTH EVERY DAY    atorvastatin  (LIPITOR) 10 MG tablet TAKE 1 TABLET BY MOUTH EVERY DAY   calcium  carbonate (OS-CAL - DOSED IN MG OF ELEMENTAL CALCIUM ) 1250 (500 Ca) MG tablet Take 1 tablet by mouth daily.   Carboxymethylcellulose Sodium (EYE DROPS OP) Place 1 drop into both eyes 3 (three) times daily as needed (dry eyes).   cholecalciferol (VITAMIN D3) 25 MCG (1000 UNIT) tablet Take 1,000 Units by mouth daily.   DULoxetine  (CYMBALTA ) 60 MG capsule Take 60 mg by mouth at bedtime.   esomeprazole (NEXIUM) 40 MG capsule Take 40 mg by mouth daily at 12 noon.   Iron-FA-B Cmp-C-Biot-Probiotic (FUSION PLUS) CAPS TAKE 1 CAPSULE BY MOUTH MONDAY THROUGH FRIDAYS   Magnesium 400 MG TABS Take 1 tablet by mouth daily.   meloxicam (MOBIC) 15 MG tablet Take 15 mg by mouth daily.   Omega-3 Fatty Acids (FISH OIL) 1000 MG CAPS Take 1,000 mg by mouth daily.    traZODone  (DESYREL ) 100 MG tablet Take 100 mg by mouth at bedtime.   Varenicline  Tartrate, Starter, 0.5 MG X 11 & 1 MG X 42 TBPK STARTER PACK, USE AS DIRECTED   No facility-administered encounter medications on file as of 04/24/2024.    Allergies (verified) Patient has no known allergies.   History: Past Medical History:  Diagnosis Date   Anxiety 06/07/2023   Ascending aortic aneurysm (HCC) 11/29/2021   Depression    Essential hypertension 11/29/2021  GERD (gastroesophageal reflux disease)    Tobacco use    Past Surgical History:  Procedure Laterality Date   ABDOMINAL HYSTERECTOMY     BILATERAL CARPAL TUNNEL RELEASE  2018   2018 on Right, many years ago on left   COLONOSCOPY WITH PROPOFOL  N/A 11/26/2021   Procedure: COLONOSCOPY WITH PROPOFOL ;  Surgeon: Rollin Dover, MD;  Location: [facility];  Service: Endoscopy;  Laterality: N/A;   ESOPHAGOGASTRODUODENOSCOPY (EGD) WITH PROPOFOL  N/A 11/26/2021   Procedure: ESOPHAGOGASTRODUODENOSCOPY (EGD) WITH PROPOFOL ;  Surgeon: Rollin Dover, MD;  Location: [facility];  Service: Endoscopy;  Laterality: N/A;   EYE SURGERY      HEMOSTASIS CLIP PLACEMENT  11/26/2021   Procedure: HEMOSTASIS CLIP PLACEMENT;  Surgeon: Rollin Dover, MD;  Location: [facility];  Service: Endoscopy;;   HOT HEMOSTASIS N/A 11/26/2021   Procedure: HOT HEMOSTASIS (ARGON PLASMA COAGULATION/BICAP);  Surgeon: Rollin Dover, MD;  Location: [facility];  Service: Endoscopy;  Laterality: N/A;  EGD and COLON   INCISION AND DRAINAGE PERIRECTAL ABSCESS N/A 02/27/2020   Procedure: IRRIGATION AND DEBRIDEMENT PERIRECTAL ABSCESS;  Surgeon: Belinda Cough, MD;  [facility];  Service: General;  Laterality: N/A;   IRRIGATION AND DEBRIDEMENT ABSCESS  02/27/2020   Procedure(s) Performed: IRRIGATION AND DEBRIDEMENT PERIRECTAL ABSCESS (N/A Perineum)   POLYPECTOMY  11/26/2021   Procedure: POLYPECTOMY;  Surgeon: Rollin Dover, MD;  Location: [facility];  Service: Endoscopy;;   Family History  Problem Relation Age of Onset   Heart disease Mother    Dementia Mother    Alcohol abuse Mother    Alcohol abuse Father    Heart disease Brother    Diabetes Brother    Heart attack Maternal Grandmother    Social History   Socioeconomic History   Marital status: Single    Spouse name: Not on file   Number of children: Not on file   Years of education: Not on file   Highest education level: Not on file  Occupational History   Occupation: retired  Tobacco Use   Smoking status: Every Day    Current packs/day: 0.75    Average packs/day: 0.8 packs/day for 49.6 years (37.2 ttl pk-yrs)    Types: Cigarettes    Start date: 10/03/1974   Smokeless tobacco: Never   Tobacco comments:    has been smoking 1ppd x 18 months, in addition to above history. Right now, she is not ready to quit. Currently in therapy. She is smoking no more than 15 cigs/day.   Vaping Use   Vaping status: Never Used  Substance and Sexual Activity   Alcohol use: Yes    Alcohol/week: 2.0 standard drinks of alcohol    Types: 2 Cans of beer per week    Comment: 3 beers a day   Drug use: No    Sexual activity: Not Currently  Other Topics Concern   Not on file  Social History Narrative   Not on file   Social Drivers of Health   Financial Resource Strain: Low Risk  (04/24/2024)   Overall Financial Resource Strain (CARDIA)    Difficulty of Paying Living Expenses: Not hard at all  Food Insecurity: No Food Insecurity (04/24/2024)   Hunger Vital Sign    Worried About Running Out of Food in the Last Year: Never true    Ran Out of Food in the Last Year: Never true  Transportation Needs: No Transportation Needs (04/24/2024)   PRAPARE - Administrator, Civil Service (Medical): No    Lack of Transportation (  Non-Medical): No  Physical Activity: Inactive (04/24/2024)   Exercise Vital Sign    Days of Exercise per Week: 0 days    Minutes of Exercise per Session: 0 min  Stress: Stress Concern Present (04/24/2024)   [employer] of Occupational Health - Occupational Stress Questionnaire    Feeling of Stress: Rather much  Social Connections: Socially Isolated (04/24/2024)   Social Connection and Isolation Panel    Frequency of Communication with Friends and Family: Three times a week    Frequency of Social Gatherings with Friends and Family: Never    Attends Religious Services: Never    Database administrator or Organizations: No    Attends Engineer, structural: Never    Marital Status: Never married    Tobacco Counseling Ready to quit: Not Answered Counseling given: Not Answered Tobacco comments: has been smoking 1ppd x 18 months, in addition to above history. Right now, she is not ready to quit. Currently in therapy. She is smoking no more than 15 cigs/day.     Clinical Intake:  Pre-visit preparation completed: Yes  Pain : No/denies pain     Nutritional Status: BMI of 19-24  Normal Nutritional Risks: None Diabetes: No  Lab Results  Component Value Date   HGBA1C 5.5 11/29/2022     How often do you need to have someone help you when you read  instructions, pamphlets, or other written materials from your doctor or pharmacy?: 1 - Never  Interpreter Needed?: No  Information entered by :: NAllen LPN   Activities of Daily Living     04/24/2024   10:54 AM  In your present state of health, do you have any difficulty performing the following activities:  Hearing? 1  Comment some trouble decipher words  Vision? 1  Comment trouble with right eye  Difficulty concentrating or making decisions? 1  Walking or climbing stairs? 0  Dressing or bathing? 0  Doing errands, shopping? 0  Preparing Food and eating ? N  Using the Toilet? N  In the past six months, have you accidently leaked urine? N  Do you have problems with loss of bowel control? N  Managing your Medications? N  Managing your Finances? N  Housekeeping or managing your Housekeeping? N    Patient Care Team: Jarold Medici, MD as PCP - General (Internal Medicine) Raford Riggs, MD as PCP - Cardiology (Cardiology) Morgan Clayborne CROME, RN as [facility] Merlynn Lyle CROME, LCSW as [facility] Care Management (Licensed Clinical Social Worker)  I have updated your Care Teams any recent Medical Services you may have received from other providers in the past year.     Assessment:   This is a routine wellness examination for Maureen Rodgers.  Hearing/Vision screen Hearing Screening - Comments:: Trouble deciphering words some times Vision Screening - Comments:: Regular eye exams, Dr. Octavia   Goals Addressed             This Visit's Progress    Patient Stated       04/24/2024, want to quit smoking and drinking       Depression Screen     04/24/2024   11:03 AM 04/24/2024   10:33 AM 04/23/2024   12:48 PM 04/11/2024   12:26 PM 04/08/2024   10:20 AM 03/14/2024   10:45 AM 10/11/2023   10:22 AM  PHQ 2/9 Scores  PHQ - 2 Score 4 4 4 4 4 4  0  PHQ- 9 Score 6 6 6 6 6 6  0  Fall Risk     04/24/2024   11:02 AM 04/24/2024   10:33 AM 01/17/2024    1:05 PM 10/11/2023   10:22 AM  04/13/2023   11:15 AM  Fall Risk   Falls in the past year? 0 0 1 0 0  Comment   Fell while mowing the lawn, slipped on gravel    Number falls in past yr: 0 0 0 0 0  Injury with Fall? 0 0 0 0 0  Risk for fall due to : Medication side effect No Fall Risks  No Fall Risks Medication side effect  Follow up Falls evaluation completed;Falls prevention discussed Falls evaluation completed  Falls evaluation completed Falls prevention discussed;Falls evaluation completed    MEDICARE RISK AT HOME:  Medicare Risk at Home Any stairs in or around the home?: Yes If so, are there any without handrails?: No Home free of loose throw rugs in walkways, pet beds, electrical cords, etc?: Yes Adequate lighting in your home to reduce risk of falls?: Yes Life alert?: No Use of a cane, walker or w/c?: No Grab bars in the bathroom?: No Shower chair or bench in shower?: No Elevated toilet seat or a handicapped toilet?: Yes  TIMED UP AND GO:  Was the test performed?  Yes  Length of time to ambulate 10 feet: 5 sec Gait steady and fast without use of assistive device  Cognitive Function: 6CIT completed        04/24/2024   11:03 AM 04/13/2023   11:15 AM 03/24/2022   11:43 AM 02/24/2021    3:59 PM 02/12/2020    3:14 PM  6CIT Screen  What Year? 0 points 0 points 0 points 0 points 0 points  What month? 0 points 0 points 0 points 0 points 0 points  What time? 0 points 0 points 0 points 0 points 0 points  Count back from 20 0 points 2 points 0 points 2 points 0 points  Months in reverse 0 points 0 points 0 points 0 points 0 points  Repeat phrase 4 points 2 points 4 points 4 points 4 points  Total Score 4 points 4 points 4 points 6 points 4 points    Immunizations Immunization History  Administered Date(s) Administered   Fluad Quad(high Dose 65+) 08/11/2020, 09/12/2022, 10/09/2023   Influenza, High Dose Seasonal PF 06/26/2019   Moderna SARS-COV2 Booster Vaccination 09/11/2020, 10/09/2023   Moderna  Sars-Covid-2 Vaccination 10/29/2019, 11/26/2019, 09/11/2020, 03/09/2021, 07/27/2021   Pneumococcal Conjugate-13 03/03/2020   Pneumococcal Polysaccharide-23 03/09/2021   Tdap 03/03/2020   Zoster Recombinant(Shingrix) 10/22/2019, 12/21/2019    Screening Tests Health Maintenance  Topic Date Due   COVID-19 Vaccine (5 - 2024-25 season) 12/04/2023   INFLUENZA VACCINE  05/03/2024   Lung Cancer Screening  10/26/2024   MAMMOGRAM  03/01/2025   Medicare Annual Wellness (AWV)  04/24/2025   DTaP/Tdap/Td (2 - Td or Tdap) 03/03/2030   Colonoscopy  11/27/2031   Pneumococcal Vaccine: 50+ Years  Completed   DEXA SCAN  Completed   Hepatitis C Screening  Completed   Zoster Vaccines- Shingrix  Completed   Hepatitis B Vaccines  Aged Out   HPV VACCINES  Aged Out   Meningococcal B Vaccine  Aged Out    Health Maintenance  Health Maintenance Due  Topic Date Due   COVID-19 Vaccine (5 - 2024-25 season) 12/04/2023   Health Maintenance Items Addressed: Up to date  Additional Screening:  Vision Screening: Recommended annual ophthalmology exams for early detection of glaucoma and other disorders  of the eye. Would you like a referral to an eye doctor? No    Dental Screening: Recommended annual dental exams for proper oral hygiene  Community Resource Referral / Chronic Care Management: CRR required this visit?  No   CCM required this visit?  No   Plan:    I have personally reviewed and noted the following in the patient's chart:   Medical and social history Use of alcohol, tobacco or illicit drugs  Current medications and supplements including opioid prescriptions. Patient is not currently taking opioid prescriptions. Functional ability and status Nutritional status Physical activity Advanced directives List of other physicians Hospitalizations, surgeries, and ER visits in previous 12 months Vitals Screenings to include cognitive, depression, and falls Referrals and appointments  In  addition, I have reviewed and discussed with patient certain preventive protocols, quality metrics, and best practice recommendations. A written personalized care plan for preventive services as well as general preventive health recommendations were provided to patient.   Maureen FORBES Dawn, LPN   2/76/7974   After Visit Summary: (In Person-Printed) AVS printed and given to the patient  Notes: Nothing significant to report at this time.

## 2024-04-25 LAB — VITAMIN B12: Vitamin B-12: 447 pg/mL (ref 232–1245)

## 2024-04-25 LAB — TSH: TSH: 1.49 u[IU]/mL (ref 0.450–4.500)

## 2024-04-25 LAB — CMP14+EGFR
ALT: 22 IU/L (ref 0–32)
AST: 28 IU/L (ref 0–40)
Albumin: 4.5 g/dL (ref 3.8–4.8)
Alkaline Phosphatase: 62 IU/L (ref 44–121)
BUN/Creatinine Ratio: 6 — ABNORMAL LOW (ref 12–28)
BUN: 4 mg/dL — ABNORMAL LOW (ref 8–27)
Bilirubin Total: 0.5 mg/dL (ref 0.0–1.2)
CO2: 22 mmol/L (ref 20–29)
Calcium: 9.4 mg/dL (ref 8.7–10.3)
Chloride: 99 mmol/L (ref 96–106)
Creatinine, Ser: 0.7 mg/dL (ref 0.57–1.00)
Globulin, Total: 2.2 g/dL (ref 1.5–4.5)
Glucose: 90 mg/dL (ref 70–99)
Potassium: 4.7 mmol/L (ref 3.5–5.2)
Sodium: 137 mmol/L (ref 134–144)
Total Protein: 6.7 g/dL (ref 6.0–8.5)
eGFR: 91 mL/min/1.73 (ref >59)

## 2024-04-25 LAB — CBC
Hematocrit: 43.4 % (ref 34.0–46.6)
Hemoglobin: 14.6 g/dL (ref 11.1–15.9)
MCH: 33.6 pg — ABNORMAL HIGH (ref 26.6–33.0)
MCHC: 33.6 g/dL (ref 31.5–35.7)
MCV: 100 fL — ABNORMAL HIGH (ref 79–97)
Platelets: 319 x10E3/uL (ref 150–450)
RBC: 4.35 x10E6/uL (ref 3.77–5.28)
RDW: 12.5 % (ref 11.7–15.4)
WBC: 3.8 x10E3/uL (ref 3.4–10.8)

## 2024-04-29 ENCOUNTER — Ambulatory Visit: Payer: Self-pay | Admitting: Internal Medicine

## 2024-04-29 DIAGNOSIS — Z01411 Encounter for gynecological examination (general) (routine) with abnormal findings: Secondary | ICD-10-CM | POA: Diagnosis not present

## 2024-04-29 DIAGNOSIS — Z0001 Encounter for general adult medical examination with abnormal findings: Secondary | ICD-10-CM | POA: Diagnosis not present

## 2024-04-29 DIAGNOSIS — M810 Age-related osteoporosis without current pathological fracture: Secondary | ICD-10-CM | POA: Diagnosis not present

## 2024-04-29 DIAGNOSIS — L738 Other specified follicular disorders: Secondary | ICD-10-CM | POA: Diagnosis not present

## 2024-04-29 DIAGNOSIS — F418 Other specified anxiety disorders: Secondary | ICD-10-CM | POA: Diagnosis not present

## 2024-04-29 NOTE — Telephone Encounter (Signed)
 Received Patient portion of PAP application [employer] ([employer])- will check on provider portion.

## 2024-05-05 NOTE — Assessment & Plan Note (Addendum)
 Chronic, LDL goal < 70.  She will c/w atorvastatin  10mg  daily. She is encouraged to follow heart healthy lifestyle.  - Consider anti-platelet therapy; however, would be at increased risk of gastritis due to regular alcohol intake.

## 2024-05-05 NOTE — Assessment & Plan Note (Signed)
Chronic, well controlled. She will c/w amlodipine 10mg  daily. She is encouraged to follow low sodium diet.  She will f/u in six months for re-evaluation.

## 2024-05-05 NOTE — Assessment & Plan Note (Signed)
 She has greater than 30 pack year history of tobacco use. Her last LDCT was in January 2025, next one due January 2026.

## 2024-05-05 NOTE — Assessment & Plan Note (Signed)
Chronic, LDL goal < 70.  She will c/w atorvastatin 10mg  daily. She is encouraged to follow heart healthy lifestyle.

## 2024-05-05 NOTE — Assessment & Plan Note (Signed)
 Initiated workup for memory issues, including B12 and thyroid  levels. - Order B12 level and thyroid  function test. - Administer B12 injections weekly for four weeks if B12 is low, then monthly, and eventually quarterly.

## 2024-05-05 NOTE — Assessment & Plan Note (Signed)
 Chronic, not currently on medication therapy. Has been on Antabuse in the past. Currently followed by [facility], she has now agreed to attending AA meetings.

## 2024-05-05 NOTE — Assessment & Plan Note (Signed)
 Worsening depression noted. Psychiatric evaluation needed for medication management. - Refer to psychiatrist for medication management. - Order thyroid  function test.

## 2024-05-07 ENCOUNTER — Other Ambulatory Visit: Payer: Self-pay | Admitting: Internal Medicine

## 2024-05-07 MED ORDER — CYCLOSPORINE 0.05 % OP EMUL: 1.0000 [drp] | Freq: Two times a day (BID) | OPHTHALMIC | Status: AC

## 2024-05-09 ENCOUNTER — Other Ambulatory Visit: Payer: Self-pay | Admitting: Licensed Clinical Social Worker

## 2024-05-09 NOTE — Telephone Encounter (Signed)
 PAP: Application for [employer]asis  has been submitted to [employer]), via fax

## 2024-05-09 NOTE — Patient Instructions (Signed)
 Visit Information  Thank you for taking time to visit with me today. Please don't hesitate to contact me if I can be of assistance to you before our next scheduled appointment.  Your next care management appointment is by telephone on 06/10/24 at 10:30 am  Please call the care guide team at [phone] if you need to cancel, schedule, or reschedule an appointment.   Please call the Suicide and Crisis Lifeline: 988 call the [place]  National Suicide Prevention Lifeline: (878)452-0514 or TTY: 501-510-2800 TTY (702)488-1052) to talk to a trained counselor go to [facility] [address], [place] [phone]) if you are experiencing a Mental Health or Behavioral Health Crisis or need someone to talk to.  Lyle Rung, BSW, MSW, LCSW Licensed Clinical Social Worker [employer]   [email or web address] Direct Dial: [phone]

## 2024-05-09 NOTE — Patient Outreach (Signed)
 Complex Care Management   Visit Note  05/09/2024  Name:  Maureen Rodgers MRN: [id number] DOB: 09/06/1951  Situation: Referral received for Complex Care Management related to Substance Abuse/Misuse Alcohol I obtained verbal consent from Patient.  Visit completed with patient  on the phone  Background:   Past Medical History:  Diagnosis Date   Anxiety 06/07/2023   Ascending aortic aneurysm (HCC) 11/29/2021   Depression    Essential hypertension 11/29/2021   GERD (gastroesophageal reflux disease)    Tobacco use     Assessment: Patient Reported Symptoms:  Cognitive Cognitive Status: Able to follow simple commands, Alert and oriented to person, place, and time Cognitive/Intellectual Conditions Management [RPT]: None reported or documented in medical history or problem list   Health Maintenance Behaviors: Annual physical exam Healing Pattern: Average  Neurological Neurological Review of Symptoms: Hearing changes Neurological Management Strategies: Routine screening Neurological Self-Management Outcome: 3 (uncertain)  Endocrine Endocrine Symptoms Reported: No symptoms reported    Psychosocial Psychosocial Symptoms Reported: Substance use Additional Psychological Details: Patient admits ongoing alcohol usage , limit social support, isolation and depressive symptoms. Patient reports that she completed a recent psychiatry visit by phone on 03/05/24 with her same provider. She reports that she meets with her counselor Richerd Kerns regularly but needs to make a new appointment. Patient denied wanting inpatient SA treatment at this time but is open to considering AA meetings. She agreed to going to an AA meeting in the next 14 days. She will also consider inpatient tx again but pt reports concerned about pricing for this service. Behavioral Management Strategies: Counseling, Medication therapy Behavioral Health Self-Management Outcome: 3 (uncertain) Major Change/Loss/Stressor/Fears (CP):  Medical condition, self Behaviors When Feeling Stressed/Fearful: Anxious        04/24/2024   11:03 AM  Depression screen PHQ 2/9  Decreased Interest 2  Down, Depressed, Hopeless 2  PHQ - 2 Score 4  Altered sleeping 1  Tired, decreased energy 1  Change in appetite 0  Feeling bad or failure about yourself  0  Trouble concentrating 0  Moving slowly or fidgety/restless 0  Suicidal thoughts 0  PHQ-9 Score 6  Difficult doing work/chores Somewhat difficult   SDOH Screenings   Food Insecurity: No Food Insecurity (04/24/2024)  Housing: Unknown (04/24/2024)  Transportation Needs: No Transportation Needs (04/24/2024)  Utilities: Not At Risk (04/24/2024)  Alcohol Screen: High Risk (05/09/2024)  Depression (PHQ2-9): Medium Risk (04/24/2024)  Financial Resource Strain: Low Risk  (04/24/2024)  Physical Activity: Inactive (04/24/2024)  Social Connections: Socially Isolated (05/09/2024)  Stress: Stress Concern Present (05/09/2024)  Tobacco Use: High Risk (04/24/2024)  Health Literacy: Adequate Health Literacy (04/24/2024)   There were no vitals filed for this visit.  Medications Reviewed Today     Reviewed by Merlynn Lyle CROME, LCSW (Social Worker) on 05/09/24 at 1042  Med List Status: <None>   Medication Order Taking? Sig Documenting Provider Last Dose Status Informant  acetaminophen  (TYLENOL ) 325 MG tablet [id number]  Take 325 mg by mouth every 6 (six) hours as needed for moderate pain (pain score 4-6). [provider]  Active   acetaminophen  (TYLENOL ) 650 MG CR tablet [id number]  Take 650 mg by mouth as needed for pain. [provider]  Active   alendronate (FOSAMAX) 70 MG tablet [id number]  TAKE 1 TABLET (70 MG TOTAL) BY MOUTH EVERY SUNDAY. Jarold Medici, MD  Active   ALPRAZolam  (XANAX ) 1 MG tablet [id number]  Take 1 mg by mouth 3 (three) times daily as needed for anxiety or sleep.  [provider]  Active Self  amLODipine  (NORVASC ) 10 MG tablet [id number]  TAKE 1 TABLET BY MOUTH  EVERY DAY Raford Riggs, MD  Active   atorvastatin  (LIPITOR) 10 MG tablet [id number]  TAKE 1 TABLET BY MOUTH EVERY DAY Raford Riggs, MD  Active   calcium  carbonate (OS-CAL - DOSED IN MG OF ELEMENTAL CALCIUM ) 1250 (500 Ca) MG tablet [id number]  Take 1 tablet by mouth daily. [provider]  Active   Carboxymethylcellulose Sodium (EYE DROPS OP) [id number]  Place 1 drop into both eyes 3 (three) times daily as needed (dry eyes). [provider]  Active Self  cholecalciferol (VITAMIN D3) 25 MCG (1000 UNIT) tablet [id number]  Take 1,000 Units by mouth daily. [provider]  Active   cycloSPORINE  (RESTASIS ) 0.05 % ophthalmic emulsion [id number]  Place 1 drop into both eyes 2 (two) times daily. Jarold Medici, MD  Active   DULoxetine  (CYMBALTA ) 60 MG capsule [id number]  Take 60 mg by mouth at bedtime. [provider]  Active Self  esomeprazole (NEXIUM) 40 MG capsule [id number]  Take 40 mg by mouth daily at 12 noon. [provider]  Active Self  Iron-FA-B Cmp-C-Biot-Probiotic (FUSION PLUS) CAPS [id number]  TAKE 1 CAPSULE BY MOUTH MONDAY THROUGH DARIL Jarold Medici, MD  Active   Magnesium 400 MG TABS [id number]  Take 1 tablet by mouth daily. [provider]  Active Self  meloxicam (MOBIC) 15 MG tablet [id number]  Take 15 mg by mouth daily. [provider]  Active   Omega-3 Fatty Acids (FISH OIL) 1000 MG CAPS [id number]  Take 1,000 mg by mouth daily.  [provider]  Active Self  traZODone  (DESYREL ) 100 MG tablet [id number]  Take 100 mg by mouth at bedtime. [provider]  Active Self  Varenicline  Tartrate, Starter, 0.5 MG X 11 & 1 MG X 42 TBPK [id number]  STARTER PACK, USE AS DIRECTED Jarold Medici, MD  Active             Recommendation:   PCP Follow-up Continue Current Plan of Care  Follow Up Plan:   Telephone follow-up in 1 month  Lyle Rung, BSW, MSW, LCSW Licensed Clinical Social Worker [employer]    [facility] [email or web address] Direct Dial: [phone]

## 2024-05-10 ENCOUNTER — Other Ambulatory Visit: Payer: Self-pay

## 2024-05-10 NOTE — Patient Outreach (Signed)
 Complex Care Management   Visit Note  05/10/2024  Name:  Maureen Rodgers MRN: [id number] DOB: November 09, 1950  Situation: Referral received for Complex Care Management related to Hypertensive Heart disease without heart failure; Moderate episode of recurrent major depressive disorder; Cigarette nicotine dependence without complication; Alcohol use disorder, moderate, dependence; Essential Hypertension; chronic low back pain; dry eyes. I obtained verbal consent from Patient.  Visit completed with patient on the phone.  Background:   Past Medical History:  Diagnosis Date   Anxiety 06/07/2023   Ascending aortic aneurysm (HCC) 11/29/2021   Depression    Essential hypertension 11/29/2021   GERD (gastroesophageal reflux disease)    Tobacco use     Assessment: Patient Reported Symptoms:  Cognitive Cognitive Status: Alert and oriented to person, place, and time, Normal speech and language skills Cognitive/Intellectual Conditions Management [RPT]: None reported or documented in medical history or problem list   Health Maintenance Behaviors: Annual physical exam  Neurological Neurological Review of Symptoms: Hearing changes Neurological Management Strategies: Routine screening Neurological Self-Management Outcome: 3 (uncertain)  HEENT HEENT Symptoms Reported: Change or loss of hearing, Eye dryness HEENT Management Strategies: Medication therapy, Routine screening HEENT Self-Management Outcome: 4 (good) HEENT Comment: sent in basket message to [facility] Pharm D re: PAP for Restasis     Cardiovascular Cardiovascular Symptoms Reported: Lightheadness Does patient have uncontrolled Hypertension?: No Is patient checking Blood Pressure at home?: No Cardiovascular Management Strategies: Routine screening, Medication therapy Cardiovascular Self-Management Outcome: 4 (good) Cardiovascular Comment: hx of Hypertensive heart disease without heart failure; CAD; Atherosclerosis of Aorta; Ascending aortic  aneurysm  Respiratory Respiratory Symptoms Reported: Dry cough Other Respiratory Symptoms: current smoker Respiratory Management Strategies: Routine screening Respiratory Self-Management Outcome: 3 (uncertain)  Endocrine Endocrine Symptoms Reported: No symptoms reported    Gastrointestinal Gastrointestinal Symptoms Reported: Change in appetite Additional Gastrointestinal Details: patient relates change in appetite to be related to her depression Gastrointestinal Management Strategies: Diet modification (Routine Screening) Gastrointestinal Self-Management Outcome: 3 (uncertain)    Genitourinary Genitourinary Symptoms Reported: No symptoms reported    Integumentary Integumentary Symptoms Reported: No symptoms reported    Musculoskeletal Musculoskelatal Symptoms Reviewed: Unsteady gait Musculoskeletal Management Strategies: Routine screening, Adequate rest Musculoskeletal Self-Management Outcome: 4 (good) Falls in the past year?: No Number of falls in past year: 1 or less Was there an injury with Fall?: No Fall Risk Category Calculator: 0 Patient Fall Risk Level: Low Fall Risk Patient at Risk for Falls Due to: Impaired balance/gait, Other (Comment) (Substance Abuse with Alcohol use) Fall risk Follow up: Falls evaluation completed, Education provided, Falls prevention discussed  Psychosocial Psychosocial Symptoms Reported: Substance use Behavioral Management Strategies: Counseling, Medication therapy Behavioral Health Self-Management Outcome: 2 (bad) Major Change/Loss/Stressor/Fears (CP): Medical condition, self, Death of a loved one Behaviors When Feeling Stressed/Fearful: drinking and smoking cigarettes Techniques to Cope with Loss/Stress/Change: Substance use, Counseling, Medication (patient is working with Bobie Rung LCSW with [facility]) Quality of Family Relationships: supportive      04/24/2024   11:03 AM  Depression screen PHQ 2/9  Decreased Interest 2  Down, Depressed, Hopeless  2  PHQ - 2 Score 4  Altered sleeping 1  Tired, decreased energy 1  Change in appetite 0  Feeling bad or failure about yourself  0  Trouble concentrating 0  Moving slowly or fidgety/restless 0  Suicidal thoughts 0  PHQ-9 Score 6  Difficult doing work/chores Somewhat difficult    There were no vitals filed for this visit.  Medications Reviewed Today     Reviewed by  Alaina Donati, Clayborne CROME, RN (Registered Nurse) on 05/10/24 at 1448  Med List Status: <None>   Medication Order Taking? Sig Documenting Provider Last Dose Status Informant  acetaminophen  (TYLENOL ) 325 MG tablet [id number]  Take 325 mg by mouth every 6 (six) hours as needed for moderate pain (pain score 4-6). [provider]  Active   acetaminophen  (TYLENOL ) 650 MG CR tablet [id number]  Take 650 mg by mouth as needed for pain. [provider]  Active   alendronate (FOSAMAX) 70 MG tablet [id number]  TAKE 1 TABLET (70 MG TOTAL) BY MOUTH EVERY SUNDAY. Jarold Medici, MD  Active   ALPRAZolam  (XANAX ) 1 MG tablet [id number]  Take 1 mg by mouth 3 (three) times daily as needed for anxiety or sleep. [provider]  Active Self  amLODipine  (NORVASC ) 10 MG tablet [id number]  TAKE 1 TABLET BY MOUTH EVERY DAY Raford Riggs, MD  Active   atorvastatin  (LIPITOR) 10 MG tablet [id number]  TAKE 1 TABLET BY MOUTH EVERY DAY Raford Riggs, MD  Active   calcium  carbonate (OS-CAL - DOSED IN MG OF ELEMENTAL CALCIUM ) 1250 (500 Ca) MG tablet [id number]  Take 1 tablet by mouth daily. [provider]  Active   Carboxymethylcellulose Sodium (EYE DROPS OP) [id number]  Place 1 drop into both eyes 3 (three) times daily as needed (dry eyes). [provider]  Active Self  cholecalciferol (VITAMIN D3) 25 MCG (1000 UNIT) tablet [id number]  Take 1,000 Units by mouth daily. [provider]  Active   cycloSPORINE  (RESTASIS ) 0.05 % ophthalmic emulsion [id number]  Place 1 drop into both eyes 2 (two) times daily. Jarold Medici,  MD  Active   DULoxetine  (CYMBALTA ) 60 MG capsule [id number]  Take 60 mg by mouth at bedtime. [provider]  Active Self  esomeprazole (NEXIUM) 40 MG capsule [id number]  Take 40 mg by mouth daily at 12 noon. [provider]  Active Self  Iron-FA-B Cmp-C-Biot-Probiotic (FUSION PLUS) CAPS [id number]  TAKE 1 CAPSULE BY MOUTH MONDAY THROUGH DARIL Jarold Medici, MD  Active   Magnesium 400 MG TABS [id number]  Take 1 tablet by mouth daily. [provider]  Active Self  meloxicam (MOBIC) 15 MG tablet [id number]  Take 15 mg by mouth daily. [provider]  Active   Omega-3 Fatty Acids (FISH OIL) 1000 MG CAPS [id number]  Take 1,000 mg by mouth daily.  [provider]  Active Self  traZODone  (DESYREL ) 100 MG tablet [id number]  Take 100 mg by mouth at bedtime. [provider]  Active Self  Varenicline  Tartrate, Starter, 0.5 MG X 11 & 1 MG X 42 TBPK [id number]  STARTER PACK, USE AS DIRECTED Jarold Medici, MD  Active             Recommendation:   Continue Current Plan of Care  Follow Up Plan:   Closing From:  Complex Care Management  Clayborne Ly RN BSN CCM [facility]  [facility], [facility] Nurse Care Coordinator  Direct Dial: [phone] Website: [email or web address]

## 2024-05-10 NOTE — Patient Instructions (Addendum)
 Visit Information  Thank you for taking time to visit with me today. Please keep your next scheduled telephone follow up with Lyle Rung LCSW scheduled for: Monday, September 8 at 10:30 AM  Please call the Suicide and Crisis Lifeline: 988 call 1-800-273-TALK (toll free, 24 hour hotline) if you are experiencing a Mental Health or Behavioral Health Crisis or need someone to talk to.  Clayborne Ly RN BSN CCM [facility]  [facility], [facility] Nurse Care Coordinator  Direct Dial: [phone] Website: [email or web address]

## 2024-05-13 ENCOUNTER — Encounter: Payer: Self-pay | Admitting: Pharmacist

## 2024-05-13 ENCOUNTER — Telehealth: Payer: Self-pay | Admitting: Pharmacist

## 2024-05-13 DIAGNOSIS — Z79899 Other long term (current) drug therapy: Secondary | ICD-10-CM

## 2024-05-13 NOTE — Progress Notes (Signed)
 05/13/2024  Patient ID: Maureen Rodgers, female   DOB: 1951-01-02, 73 y.o.   MRN: [id number]  Called Patient to follow up on medication assistance with Restatis as instructed by Clayborne Ly, RN. HIPAA identifiers were obtained.  Patient's Resisis application was submitted on 05/09/2024. We are waiting to hear back from [employer].  Patient's medications were reviewed via telephone:  Medications Reviewed Today     Reviewed by Jolee Cassius PARAS, [facility] (Pharmacist) on 05/13/24 at 1022  Med List Status: <None>   Medication Order Taking? Sig Documenting Provider Last Dose Status Informant  acetaminophen  (TYLENOL ) 325 MG tablet [id number] Yes Take 325 mg by mouth every 6 (six) hours as needed for moderate pain (pain score 4-6). [provider]  Active   acetaminophen  (TYLENOL ) 650 MG CR tablet [id number] Yes Take 650 mg by mouth as needed for pain. [provider]  Active   alendronate (FOSAMAX) 70 MG tablet [id number] Yes TAKE 1 TABLET (70 MG TOTAL) BY MOUTH EVERY SUNDAY. Jarold Medici, MD  Active   ALPRAZolam  (XANAX ) 1 MG tablet [id number] Yes Take 1 mg by mouth 3 (three) times daily as needed for anxiety or sleep. [provider]  Active Self  amLODipine  (NORVASC ) 10 MG tablet [id number] Yes TAKE 1 TABLET BY MOUTH EVERY DAY Raford Riggs, MD  Active   atorvastatin  (LIPITOR) 10 MG tablet [id number] Yes TAKE 1 TABLET BY MOUTH EVERY DAY Raford Riggs, MD  Active   calcium  carbonate (OS-CAL - DOSED IN MG OF ELEMENTAL CALCIUM ) 1250 (500 Ca) MG tablet [id number] Yes Take 1 tablet by mouth daily. [provider]  Active   Carboxymethylcellulose Sodium (EYE DROPS OP) [id number] Yes Place 1 drop into both eyes 3 (three) times daily as needed (dry eyes). [provider]  Active Self  cholecalciferol (VITAMIN D3) 25 MCG (1000 UNIT) tablet [id number] Yes Take 1,000 Units by mouth daily. [provider]  Active   cycloSPORINE  (RESTASIS ) 0.05 %  ophthalmic emulsion [id number] Yes Place 1 drop into both eyes 2 (two) times daily. Jarold Medici, MD  Active   DULoxetine  (CYMBALTA ) 60 MG capsule [id number] Yes Take 60 mg by mouth at bedtime. [provider]  Active Self  esomeprazole (NEXIUM) 40 MG capsule [id number] Yes Take 40 mg by mouth daily at 12 noon. [provider]  Active Self  Iron-FA-B Cmp-C-Biot-Probiotic (FUSION PLUS) CAPS [id number] Yes TAKE 1 CAPSULE BY MOUTH MONDAY THROUGH DARIL Jarold Medici, MD  Active   Magnesium 400 MG TABS [id number] Yes Take 1 tablet by mouth daily. [provider]  Active Self  meloxicam (MOBIC) 15 MG tablet [id number] Yes Take 15 mg by mouth daily.  Patient taking differently: Take 15 mg by mouth daily as needed.   [provider]  Active   Omega-3 Fatty Acids (FISH OIL) 1000 MG CAPS [id number] Yes Take 1,000 mg by mouth daily.  [provider]  Active Self  traZODone  (DESYREL ) 100 MG tablet [id number] Yes Take 100 mg by mouth at bedtime. [provider]  Active Self  Varenicline  Tartrate, Starter, 0.5 MG X 11 & 1 MG X 42 TBPK [id number]  STARTER PACK, USE AS DIRECTED  Patient not taking: Reported on 05/13/2024   Jarold Medici, MD  Active            Patient reported that she did not start and still was not ready to start Chantix .    LDL- 47 mg/dl 98/74  Upcoming appointment 01/26   Follow up for re-enrollment  in November.  Cassius DOROTHA Brought, PharmD, BCACP Clinical Pharmacist [phone]

## 2024-05-13 NOTE — Progress Notes (Signed)
 error

## 2024-05-16 NOTE — Telephone Encounter (Signed)
 Reached out to [employer]- did not get the faxed application for Restasis  -resent today for review.

## 2024-05-29 DIAGNOSIS — F411 Generalized anxiety disorder: Secondary | ICD-10-CM | POA: Diagnosis not present

## 2024-05-29 DIAGNOSIS — F331 Major depressive disorder, recurrent, moderate: Secondary | ICD-10-CM | POA: Diagnosis not present

## 2024-06-10 ENCOUNTER — Other Ambulatory Visit: Payer: Self-pay | Admitting: Licensed Clinical Social Worker

## 2024-06-10 NOTE — Patient Outreach (Signed)
 Complex Care Management   Visit Note  06/10/2024  Name:  Maureen Rodgers MRN: [id number] DOB: 05-Oct-1950  Situation: Referral received for Complex Care Management related to {Criteria:32550} I obtained verbal consent from {CHL AMB Patient/Caregiver:28184}.  Visit completed with {CHL AMB Patient/Caregiver:28184}  {VISIT LOCATION:32553}  Background:   Past Medical History:  Diagnosis Date   Anxiety 06/07/2023   Ascending aortic aneurysm (HCC) 11/29/2021   Depression    Essential hypertension 11/29/2021   GERD (gastroesophageal reflux disease)    Tobacco use     Assessment: Patient Reported Symptoms:  Cognitive Cognitive Status: Able to follow simple commands, Alert and oriented to person, place, and time Cognitive/Intellectual Conditions Management [RPT]: None reported or documented in medical history or problem list   Health Maintenance Behaviors: Annual physical exam, Stress management Healing Pattern: Average Health Facilitated by: Healthy diet, Stress management  Neurological Neurological Review of Symptoms: Hearing changes Neurological Management Strategies: Routine screening Neurological Self-Management Outcome: 3 (uncertain)  HEENT HEENT Symptoms Reported: Change or loss of hearing, Ear dryness HEENT Management Strategies: Medication therapy, Routine screening HEENT Self-Management Outcome: 4 (good)    Cardiovascular      Respiratory      Endocrine      Gastrointestinal        Genitourinary      Integumentary      Musculoskeletal          Psychosocial Psychosocial Symptoms Reported: Substance use Behavioral Health Self-Management Outcome: 3 (uncertain) Major Change/Loss/Stressor/Fears (CP): Death of a loved one, Medical condition, self Techniques to Cope with Loss/Stress/Change: Substance use, Medication, Counseling Quality of Family Relationships: supportive Do you feel physically threatened by others?: No    06/10/2024    PHQ2-9 Depression Screening    Little interest or pleasure in doing things Several days  Feeling down, depressed, or hopeless More than half the days  PHQ-2 - Total Score 3  Trouble falling or staying asleep, or sleeping too much    Feeling tired or having little energy    Poor appetite or overeating     Feeling bad about yourself - or that you are a failure or have let yourself or your family down    Trouble concentrating on things, such as reading the newspaper or watching television    Moving or speaking so slowly that other people could have noticed.  Or the opposite - being so fidgety or restless that you have been moving around a lot more than usual    Thoughts that you would be better off dead, or hurting yourself in some way    PHQ2-9 Total Score    If you checked off any problems, how difficult have these problems made it for you to do your work, take care of things at home, or get along with other people    Depression Interventions/Treatment      There were no vitals filed for this visit.  Medications Reviewed Today     Reviewed by Merlynn Lyle CROME, LCSW (Social Worker) on 06/10/24 at 1119  Med List Status: <None>   Medication Order Taking? Sig Documenting Provider Last Dose Status Informant  acetaminophen  (TYLENOL ) 325 MG tablet [id number]  Take 325 mg by mouth every 6 (six) hours as needed for moderate pain (pain score 4-6). [provider]  Active   acetaminophen  (TYLENOL ) 650 MG CR tablet [id number]  Take 650 mg by mouth as needed for pain. [provider]  Active   alendronate (FOSAMAX) 70 MG tablet [id number]  TAKE  1 TABLET (70 MG TOTAL) BY MOUTH EVERY SUNDAY. Jarold Medici, MD  Active   ALPRAZolam  (XANAX ) 1 MG tablet [id number]  Take 1 mg by mouth 3 (three) times daily as needed for anxiety or sleep. [provider]  Active Self  amLODipine  (NORVASC ) 10 MG tablet [id number]  TAKE 1 TABLET BY MOUTH EVERY DAY Raford Riggs, MD  Active   atorvastatin  (LIPITOR) 10 MG tablet  [id number]  TAKE 1 TABLET BY MOUTH EVERY DAY Raford Riggs, MD  Active   calcium  carbonate (OS-CAL - DOSED IN MG OF ELEMENTAL CALCIUM ) 1250 (500 Ca) MG tablet [id number]  Take 1 tablet by mouth daily. [provider]  Active   Carboxymethylcellulose Sodium (EYE DROPS OP) [id number]  Place 1 drop into both eyes 3 (three) times daily as needed (dry eyes). [provider]  Active Self  cholecalciferol (VITAMIN D3) 25 MCG (1000 UNIT) tablet [id number]  Take 1,000 Units by mouth daily. [provider]  Active   cycloSPORINE  (RESTASIS ) 0.05 % ophthalmic emulsion [id number]  Place 1 drop into both eyes 2 (two) times daily. Jarold Medici, MD  Active   DULoxetine  (CYMBALTA ) 60 MG capsule [id number]  Take 60 mg by mouth at bedtime. [provider]  Active Self  esomeprazole (NEXIUM) 40 MG capsule [id number]  Take 40 mg by mouth daily at 12 noon. [provider]  Active Self  Iron-FA-B Cmp-C-Biot-Probiotic (FUSION PLUS) CAPS [id number]  TAKE 1 CAPSULE BY MOUTH MONDAY THROUGH DARIL Jarold Medici, MD  Active   Magnesium 400 MG TABS [id number]  Take 1 tablet by mouth daily. [provider]  Active Self  meloxicam (MOBIC) 15 MG tablet [id number]  Take 15 mg by mouth daily.  Patient taking differently: Take 15 mg by mouth daily as needed.   [provider]  Active   Omega-3 Fatty Acids (FISH OIL) 1000 MG CAPS [id number]  Take 1,000 mg by mouth daily.  [provider]  Active Self  traZODone  (DESYREL ) 100 MG tablet [id number]  Take 100 mg by mouth at bedtime. [provider]  Active Self  Varenicline  Tartrate, Starter, 0.5 MG X 11 & 1 MG X 42 TBPK [id number]  STARTER PACK, USE AS DIRECTED  Patient not taking: Reported on 05/13/2024   Jarold Medici, MD  Active             Recommendation:   {RECOMMENDATONS:32554}  Follow Up Plan:   {FOLLOWUP:32559}  SIG ***

## 2024-06-11 NOTE — Patient Instructions (Signed)
 Visit Information  Thank you for taking time to visit with me today. Please don't hesitate to contact me if I can be of assistance to you before our next scheduled appointment.  Our next appointment is by telephone on 06/26/24 at 9 Please call the care guide team at [phone] if you need to cancel or reschedule your appointment.   Following is a copy of your care plan:   Goals Addressed             This Visit's Progress    LCSW [facility] Social Work Care Plan       Problems:   Disease Management support and education needs related to Anxiety with Excessive Worry, Panic Symptoms, and Alcohol Use Disorder  CSW Clinical Goal(s):   Over the next 90 days the Patient will attend all scheduled medical appointments as evidenced by patient report and care team review of appointment completion in electronic MEDICAL RECORD NUMBER  demonstrate a reduction in symptoms related to Anxiety with Excessive Worry, Panic Symptoms, Alcohol Use Disorder .  Interventions:  Mental Health:  Evaluation of current treatment plan related to Anxiety with Excessive Worry, Panic Symptoms, Active listening / Reflection utilized Behavioral Activation reviewed Emotional Support Provided Motivational Interviewing employed Participation in counseling encourage : Patient will schedule appt to re-establish with counselor, Richerd Kerns as patient has not yet done this yet. PHQ2/PHQ9 completed Provided general psycho-education for mental health needs Solution-Focued Strategies employed:  Patient continues to see psychiatrist Camelia Mountain and completed her last visit on 03/05/24 Substance Misuse in a patient with Alcohol Use Disorder:   Evaluation of current treatment plan related to misuse of: alcohol Active listening / Reflection utilized Behavioral Activation reviewed Emotional Support Provided Participation in counseling encourage : Patient will schedule appt to re-establish counseling and continue to meet  telephonically with psychiatry. Provided general psycho-education for mental health needs Solution-Focued Strategies employed: Strategic planning for socialization events that she will be around alcohol. She reports recently going to her two family celebration events within the last 7 days.  LCSW educated patient on medication therapy that can help with alcohol triggers.  Crisis support resources reviewed as well. AA meeting list mailed to patient. Patient reports that she has experienced some stress regarding getting the courage to attend an AA meeting. Patient was encouraged to write down local meetings, times and addresses that she can utilize in her area and have them located nearby in her home for her to look at daily to help motivate and encourage her. Positive reinforcement provided for recent socialization increase.  Update-  Patient Goals/Self-Care Activities:  Increase coping skills and healthy habits  Plan:   Telephone follow up appointment with care management team member scheduled for:  2-4 weeks        Please call the Suicide and Crisis Lifeline: 988 call the [place]  National Suicide Prevention Lifeline: 204-712-5880 or TTY: 731-513-7795 TTY (443)606-2533) to talk to a trained counselor call the [facility]: [phone] if you are experiencing a Mental Health or Behavioral Health Crisis or need someone to talk to.  Patient verbalizes understanding of instructions and care plan provided today and agrees to view in MyChart. Active MyChart status and patient understanding of how to access instructions and care plan via MyChart confirmed with patient.     Lyle Rung, BSW, MSW, LCSW Licensed Clinical Social Worker [employer] [facility]   [email or web address] Direct Dial: [phone]

## 2024-06-13 NOTE — Telephone Encounter (Signed)
 Resent PAP application for Restasis  [place]) with corrected documents for review for approval.

## 2024-06-14 ENCOUNTER — Other Ambulatory Visit (HOSPITAL_BASED_OUTPATIENT_CLINIC_OR_DEPARTMENT_OTHER): Payer: Self-pay | Admitting: Cardiovascular Disease

## 2024-06-17 NOTE — Telephone Encounter (Signed)
 PAP: Patient assistance application for Restasis  has been approved by PAP Companies: [employer] from 06/17/2024 to 10/02/2024. Medication should be delivered to PAP Delivery: Provider's office. For further shipping updates, please contact [employer] at [phone]. Patient ID is: not provided Patient will need to call [employer] (pharmacy ) for refills [phone].  Letter of approval in media.

## 2024-06-26 ENCOUNTER — Other Ambulatory Visit: Payer: Self-pay | Admitting: Licensed Clinical Social Worker

## 2024-06-26 NOTE — Patient Instructions (Signed)
 Visit Information  Thank you for taking time to visit with me today. Please don't hesitate to contact me if I can be of assistance to you in the future!  Following is a copy of your care plan:   Goals Addressed             This Visit's Progress    COMPLETED: LCSW VBCI Social Work Care Plan       Problems:   Disease Management support and education needs related to Anxiety with Excessive Worry, Panic Symptoms, and Alcohol Use Disorder  CSW Clinical Goal(s):   Over the next 90 days the Patient will attend all scheduled medical appointments as evidenced by patient report and care team review of appointment completion in electronic MEDICAL RECORD NUMBER  demonstrate a reduction in symptoms related to Anxiety with Excessive Worry, Panic Symptoms, Alcohol Use Disorder .  Interventions:  Mental Health:  Evaluation of current treatment plan related to Anxiety with Excessive Worry, Panic Symptoms, Active listening / Reflection utilized Behavioral Activation reviewed Emotional Support Provided Motivational Interviewing employed Participation in counseling encourage : Patient will schedule appt to re-establish with counselor, Richerd Kerns as patient has not yet done this yet. PHQ2/PHQ9 completed Provided general psycho-education for mental health needs Solution-Focued Strategies employed:  Patient continues to see psychiatrist Camelia Mountain and completed her last visit on 03/05/24 Substance Misuse in a patient with Alcohol Use Disorder:   Evaluation of current treatment plan related to misuse of: alcohol Active listening / Reflection utilized Behavioral Activation reviewed Emotional Support Provided Participation in counseling encourage : Patient will schedule appt to re-establish counseling and continue to meet telephonically with psychiatry. Provided general psycho-education for mental health needs Solution-Focued Strategies employed: Strategic planning for socialization events  that she will be around alcohol. She reports recently going to her two family celebration events within the last 7 days.  LCSW educated patient on medication therapy that can help with alcohol triggers.  Crisis support resources reviewed as well. AA meeting list mailed to patient. Patient reports that she has experienced some stress regarding getting the courage to attend an AA meeting. Patient was encouraged to write down local meetings, times and addresses that she can utilize in her area and have them located nearby in her home for her to look at daily to help motivate and encourage her. Positive reinforcement provided for recent socialization increase.  Update-Pt declines wanting to pursue substance abuse/behavioral health resources at this time. She is agreeable to contact PCP or reach out to [facility] team in the future if she changes her mind and wishes to work on this. Pt agreeable to case closure at this time.  Patient Goals/Self-Care Activities:  Increase coping skills and healthy habits        Please call the Suicide and Crisis Lifeline: 988 call 1-800-273-TALK (toll free, 24 hour hotline) go to [facility] [address] [place] [phone]) call 911 if you are experiencing a Mental Health or Behavioral Health Crisis or need someone to talk to.  Patient verbalizes understanding of instructions and care plan provided today and agrees to view in MyChart. Active MyChart status and patient understanding of how to access instructions and care plan via MyChart confirmed with patient.     Lyle Rung, BSW, MSW, LCSW Licensed Clinical Social Worker [employer]   [facility] [email or web address] Direct Dial: [phone]

## 2024-06-26 NOTE — Patient Outreach (Signed)
 Complex Care Management   Visit Note  06/26/2024  Name:  Maureen Rodgers MRN: [id number] DOB: September 26, 1951  Situation: Referral received for Complex Care Management related to Substance Abuse/Misuse alcohol use. I obtained verbal consent from Patient.  Visit completed with Patient  on the phone  Background:   Past Medical History:  Diagnosis Date   Anxiety 06/07/2023   Ascending aortic aneurysm 11/29/2021   Depression    Essential hypertension 11/29/2021   GERD (gastroesophageal reflux disease)    Tobacco use     Assessment: Patient Reported Symptoms:  Cognitive Cognitive Status: Alert and oriented to person, place, and time, Able to follow simple commands Cognitive/Intellectual Conditions Management [RPT]: None reported or documented in medical history or problem list   Health Maintenance Behaviors: Annual physical exam, Stress management Healing Pattern: Average Health Facilitated by: Stress management, Healthy diet  Neurological Neurological Review of Symptoms: No symptoms reported Neurological Management Strategies: Routine screening Neurological Self-Management Outcome: 4 (good)  Psychosocial Psychosocial Symptoms Reported: Substance use Additional Psychological Details: Patient admits ongoing alcohol usage , limit social support, isolation and depressive symptoms. Patient reports that she completed a recent psychiatry visit by phone on 03/05/24 with her same provider. She reports that she meets with her counselor Richerd Kerns regularly but needs to make a new appointment. Patient denied wanting inpatient SA treatment at this time but is open to considering AA meetings. She agreed to going to an AA meeting in the next 14 days. She will also consider inpatient tx again but pt reports concerned about pricing for this service. Behavioral Management Strategies: Medication therapy, Adequate rest Behavioral Health Self-Management Outcome: 3 (uncertain) Major  Change/Loss/Stressor/Fears (CP): Medical condition, self Techniques to Cope with Loss/Stress/Change: Substance use, Medication, Counseling Quality of Family Relationships: non-existent Do you feel physically threatened by others?: No    06/26/2024    PHQ2-9 Depression Screening   Little interest or pleasure in doing things Several days  Feeling down, depressed, or hopeless Several days  PHQ-2 - Total Score 2  Trouble falling or staying asleep, or sleeping too much Several days  Feeling tired or having little energy Several days  Poor appetite or overeating  Not at all  Feeling bad about yourself - or that you are a failure or have let yourself or your family down Not at all  Trouble concentrating on things, such as reading the newspaper or watching television Not at all  Moving or speaking so slowly that other people could have noticed.  Or the opposite - being so fidgety or restless that you have been moving around a lot more than usual Not at all  Thoughts that you would be better off dead, or hurting yourself in some way Not at all  PHQ2-9 Total Score 4  If you checked off any problems, how difficult have these problems made it for you to do your work, take care of things at home, or get along with other people Somewhat difficult  Depression Interventions/Treatment Currently on Treatment, Community Resources Provided    There were no vitals filed for this visit.  Medications Reviewed Today     Reviewed by Merlynn Lyle CROME, LCSW (Social Worker) on 06/26/24 at [phone]  Med List Status: <None>   Medication Order Taking? Sig Documenting Provider Last Dose Status Informant  acetaminophen  (TYLENOL ) 325 MG tablet [id number]  Take 325 mg by mouth every 6 (six) hours as needed for moderate pain (pain score 4-6). [provider]  Active   acetaminophen  (TYLENOL ) 650  MG CR tablet [id number]  Take 650 mg by mouth as needed for pain. [provider]  Active   alendronate (FOSAMAX) 70  MG tablet [id number]  TAKE 1 TABLET (70 MG TOTAL) BY MOUTH EVERY SUNDAY. Jarold Medici, MD  Active   ALPRAZolam  (XANAX ) 1 MG tablet [id number]  Take 1 mg by mouth 3 (three) times daily as needed for anxiety or sleep. [provider]  Active Self  amLODipine  (NORVASC ) 10 MG tablet [id number]  TAKE 1 TABLET BY MOUTH EVERY DAY Raford Riggs, MD  Active   atorvastatin  (LIPITOR) 10 MG tablet [id number]  TAKE 1 TABLET BY MOUTH EVERY DAY Raford Riggs, MD  Active   calcium  carbonate (OS-CAL - DOSED IN MG OF ELEMENTAL CALCIUM ) 1250 (500 Ca) MG tablet [id number]  Take 1 tablet by mouth daily. [provider]  Active   Carboxymethylcellulose Sodium (EYE DROPS OP) [id number]  Place 1 drop into both eyes 3 (three) times daily as needed (dry eyes). [provider]  Active Self  cholecalciferol (VITAMIN D3) 25 MCG (1000 UNIT) tablet [id number]  Take 1,000 Units by mouth daily. [provider]  Active   cycloSPORINE  (RESTASIS ) 0.05 % ophthalmic emulsion [id number]  Place 1 drop into both eyes 2 (two) times daily. Jarold Medici, MD  Active   DULoxetine  (CYMBALTA ) 60 MG capsule [id number]  Take 60 mg by mouth at bedtime. [provider]  Active Self  esomeprazole (NEXIUM) 40 MG capsule [id number]  Take 40 mg by mouth daily at 12 noon. [provider]  Active Self  Iron-FA-B Cmp-C-Biot-Probiotic (FUSION PLUS) CAPS [id number]  TAKE 1 CAPSULE BY MOUTH MONDAY THROUGH DARIL Jarold Medici, MD  Active   Magnesium 400 MG TABS [id number]  Take 1 tablet by mouth daily. [provider]  Active Self  meloxicam (MOBIC) 15 MG tablet [id number]  Take 15 mg by mouth daily.  Patient taking differently: Take 15 mg by mouth daily as needed.   [provider]  Active   Omega-3 Fatty Acids (FISH OIL) 1000 MG CAPS [id number]  Take 1,000 mg by mouth daily.  [provider]  Active Self  traZODone  (DESYREL ) 100 MG tablet [id number]  Take 100 mg by mouth at bedtime.  [provider]  Active Self  Varenicline  Tartrate, Starter, 0.5 MG X 11 & 1 MG X 42 TBPK [id number]  STARTER PACK, USE AS DIRECTED  Patient not taking: Reported on 05/13/2024   Jarold Medici, MD  Active             Recommendation:   PCP Follow-up Continue Current Plan of Care  Follow Up Plan:   Closing From:  Complex Care Management  Lyle Rung, BSW, MSW, LCSW Licensed Clinical Social Worker [place]   [facility] [email or web address] Direct Dial: [phone]

## 2024-07-09 DIAGNOSIS — Z23 Encounter for immunization: Secondary | ICD-10-CM | POA: Diagnosis not present

## 2024-08-19 DIAGNOSIS — F411 Generalized anxiety disorder: Secondary | ICD-10-CM | POA: Diagnosis not present

## 2024-08-19 DIAGNOSIS — F331 Major depressive disorder, recurrent, moderate: Secondary | ICD-10-CM | POA: Diagnosis not present

## 2024-08-27 ENCOUNTER — Ambulatory Visit: Payer: Self-pay

## 2024-08-27 NOTE — Telephone Encounter (Signed)
 FYI Only or Action Required?: FYI only for provider: ED advised.  Patient was last seen in primary care on 04/24/2024 by Jarold Medici, MD.  Called Nurse Triage reporting Dizziness.  Symptoms began several days ago.  Interventions attempted: Nothing.  Symptoms are: gradually worsening.  Triage Disposition: Go to ED Now (Notify PCP)  Patient/caregiver understands and will follow disposition?: Yes, will follow disposition  Copied from CRM [id number]. Topic: Clinical - Red Word Triage >> Aug 27, 2024  2:48 PM Amy B wrote: Red Word that prompted transfer to Nurse Triage: Nausea and dizziness, unsteady Reason for Disposition  SEVERE dizziness (vertigo) (e.g., unable to walk without assistance)  Answer Assessment - Initial Assessment Questions 1. DESCRIPTION: Describe your dizziness.     Unsteady on feet 2. VERTIGO: Do you feel like either you or the room is spinning or tilting?      I feel like I'm going to fall 3. LIGHTHEADED: Do you feel lightheaded? (e.g., somewhat faint, woozy, weak upon standing)     Yes I feel like I'm going to fall when I stand 4. SEVERITY: How bad is it?  Can you walk?     Pt states that she is able to walk but is using items to help her walk 5. ONSET:  When did the dizziness begin?     4-5 days ago 6. AGGRAVATING FACTORS: Does anything make it worse? (e.g., standing, change in head position)     Pt states that she is not eating to much, standing and moving make the dizziness worse 7. CAUSE: What do you think is causing the dizziness?     unsure 9. OTHER SYMPTOMS: Do you have any other symptoms? (e.g., earache, headache, numbness, tinnitus, vomiting, weakness)     Per pt I can feel my heart racing, beating through my ears if I am lying down.  Denies vision changes.   Pt advised to go to ED. Pt states she does not feel comfortable driving d/t dizziness. Pt was advised that she can call 911. Pt agreeable to go to the ED. RN educated pt  on ED vs UC as pt did ask about going to UC.  Protocols used: Dizziness - Vertigo-A-AH

## 2024-09-07 ENCOUNTER — Other Ambulatory Visit (HOSPITAL_BASED_OUTPATIENT_CLINIC_OR_DEPARTMENT_OTHER): Payer: Self-pay | Admitting: Cardiovascular Disease

## 2024-09-09 DIAGNOSIS — H9209 Otalgia, unspecified ear: Secondary | ICD-10-CM | POA: Insufficient documentation

## 2024-09-10 ENCOUNTER — Encounter: Payer: Self-pay | Admitting: Family Medicine

## 2024-09-10 ENCOUNTER — Ambulatory Visit: Admitting: Family Medicine

## 2024-09-10 ENCOUNTER — Ambulatory Visit: Payer: Self-pay | Admitting: Internal Medicine

## 2024-09-10 VITALS — BP 110/64 | HR 81 | Temp 98.6°F | Wt 119.0 lb

## 2024-09-10 DIAGNOSIS — H65111 Acute and subacute allergic otitis media (mucoid) (sanguinous) (serous), right ear: Secondary | ICD-10-CM

## 2024-09-10 DIAGNOSIS — F1020 Alcohol dependence, uncomplicated: Secondary | ICD-10-CM

## 2024-09-10 MED ORDER — CETIRIZINE HCL 10 MG PO TABS: 10.0000 mg | ORAL_TABLET | Freq: Every day | ORAL | 0 refills | Status: AC

## 2024-09-10 MED ORDER — ACETIC ACID 2 % OT SOLN: 4.0000 [drp] | Freq: Three times a day (TID) | OTIC | 0 refills | Status: AC

## 2024-09-10 NOTE — Progress Notes (Signed)
 I,Maureen Rodgers, CMA,acting as a neurosurgeon for Maureen Lynch, NP.,have documented all relevant documentation on the behalf of Maureen Creighton, NP,as directed by  Maureen Creighton, NP while in the presence of Maureen Creighton, NP.  Subjective:  Patient ID: Maureen Rodgers , female    DOB: 15-Aug-1951 , 73 y.o.   MRN: [id number]  Chief Complaint  Patient presents with   Ear Pain    Patient presents today for right ear pain. She reports the pain has been going on for the past 3 weeks. She reports she will sometimes get swelling around her ear or under her right eye.     HPI Discussed the use of AI scribe software for clinical note transcription with the patient, who gave verbal consent to proceed.  History of Present Illness     IVEY NEMBHARD is a 73 year old female who presents with right ear pain and discomfort.  She has been experiencing right ear pain and discomfort for at least two weeks, described as a sensation of something wanting to pop in the ear, with occasional swelling. The pain is rated as 6 to 7 out of 10 and sometimes radiates down her neck. Dizziness is also present at times. She has not tried any ear drops for this issue.  She mentions having a runny nose and congestion, along with a bad cough that started around Thanksgiving. The cough was severe enough to keep her in bed, and she took cough medicine during that time. The ear pain began during this period. No fever is reported.  Her past medical history includes anxiety and depression, for which she takes Cymbalta  and trazodone . She has a history of alcohol use, drinking four tall cans of beer daily, and smokes about half a pack of cigarettes a day. She has previously attended [facility] and was sober for six to seven years before resuming drinking.  She has a history of hay fever and occasionally takes Benadryl for itchy sensations, although she does not take it frequently due to its drowsy effect. She  has not been taking any regular allergy medication recently.      Past Medical History:  Diagnosis Date   Anxiety 06/07/2023   Ascending aortic aneurysm 11/29/2021   Depression    Essential hypertension 11/29/2021   GERD (gastroesophageal reflux disease)    Tobacco use      Family History  Problem Relation Age of Onset   Heart disease Mother    Dementia Mother    Alcohol abuse Mother    Alcohol abuse Father    Heart disease Brother    Diabetes Brother    Heart attack Maternal Grandmother      Current Outpatient Medications:    acetaminophen  (TYLENOL ) 325 MG tablet, Take 325 mg by mouth every 6 (six) hours as needed for moderate pain (pain score 4-6)., Disp: , Rfl:    acetaminophen  (TYLENOL ) 650 MG CR tablet, Take 650 mg by mouth as needed for pain., Disp: , Rfl:    acetic acid  2 % otic solution, Place 4 drops into the right ear 3 (three) times daily., Disp: 45 mL, Rfl: 0   alendronate (FOSAMAX) 70 MG tablet, TAKE 1 TABLET (70 MG TOTAL) BY MOUTH EVERY SUNDAY., Disp: 12 tablet, Rfl: 2   ALPRAZolam  (XANAX ) 1 MG tablet, Take 1 mg by mouth 3 (three) times daily as needed for anxiety or sleep., Disp: , Rfl:    amLODipine  (NORVASC ) 10 MG tablet, TAKE 1 TABLET  BY MOUTH EVERY DAY, Disp: 90 tablet, Rfl: 2   calcium  carbonate (OS-CAL - DOSED IN MG OF ELEMENTAL CALCIUM ) 1250 (500 Ca) MG tablet, Take 1 tablet by mouth daily., Disp: , Rfl:    Carboxymethylcellulose Sodium (EYE DROPS OP), Place 1 drop into both eyes 3 (three) times daily as needed (dry eyes)., Disp: , Rfl:    cetirizine  (ZYRTEC  ALLERGY) 10 MG tablet, Take 1 tablet (10 mg total) by mouth daily., Disp: 90 tablet, Rfl: 0   cholecalciferol (VITAMIN D3) 25 MCG (1000 UNIT) tablet, Take 1,000 Units by mouth daily., Disp: , Rfl:    cycloSPORINE  (RESTASIS ) 0.05 % ophthalmic emulsion, Place 1 drop into both eyes 2 (two) times daily., Disp: , Rfl:    DULoxetine  (CYMBALTA ) 60 MG capsule, Take 60 mg by mouth at bedtime., Disp: , Rfl:     Iron-FA-B Cmp-C-Biot-Probiotic (FUSION PLUS) CAPS, TAKE 1 CAPSULE BY MOUTH MONDAY THROUGH FRIDAYS, Disp: 30 capsule, Rfl: 3   Magnesium 400 MG TABS, Take 1 tablet by mouth daily., Disp: , Rfl:    meloxicam (MOBIC) 15 MG tablet, Take 15 mg by mouth daily. (Patient taking differently: Take 15 mg by mouth daily as needed.), Disp: , Rfl:    Omega-3 Fatty Acids (FISH OIL) 1000 MG CAPS, Take 1,000 mg by mouth daily. , Disp: , Rfl:    traZODone  (DESYREL ) 100 MG tablet, Take 100 mg by mouth at bedtime., Disp: , Rfl:    atorvastatin  (LIPITOR) 10 MG tablet, Take 1 tablet (10 mg) by mouth once daily at bedtime. Please call the office at [phone] to schedule a follow up appointment for future refills., Disp: 30 tablet, Rfl: 1   esomeprazole (NEXIUM) 40 MG capsule, Take 40 mg by mouth daily at 12 noon. (Patient not taking: Reported on 09/10/2024), Disp: , Rfl:    Varenicline  Tartrate, Starter, 0.5 MG X 11 & 1 MG X 42 TBPK, STARTER PACK, USE AS DIRECTED (Patient not taking: Reported on 09/10/2024), Disp: 53 each, Rfl: 0   No Known Allergies   Review of Systems  Constitutional:  Negative for fatigue and fever.  HENT:  Positive for ear pain.   Respiratory: Negative.    Cardiovascular: Negative.   Musculoskeletal: Negative.   Psychiatric/Behavioral: Negative.       Today's Vitals   09/10/24 1037  BP: 110/64  Pulse: 81  Temp: 98.6 F (37 C)  TempSrc: Oral  Weight: 119 lb (54 kg)  PainSc: 5   PainLoc: Ear   Body mass index is 21.77 kg/m.  Wt Readings from Last 3 Encounters:  09/10/24 119 lb (54 kg)  04/24/24 130 lb (59 kg)  04/24/24 130 lb (59 kg)    The ASCVD Risk score (Arnett DK, et al., 2019) failed to calculate for the following reasons:   The valid HDL cholesterol range is 20 to 100 mg/dL  Objective:  Physical Exam Constitutional:      Appearance: Normal appearance.  HENT:     Head: Normocephalic.     Right Ear: Tenderness present.     Left Ear: A middle ear effusion is  present.  Cardiovascular:     Rate and Rhythm: Normal rate and regular rhythm.     Pulses: Normal pulses.     Heart sounds: Normal heart sounds.  Pulmonary:     Effort: Pulmonary effort is normal.     Breath sounds: Normal breath sounds.  Abdominal:     General: Bowel sounds are normal.  Neurological:     Mental  Status: She is alert.         Assessment And Plan:   Assessment & Plan Non-recurrent acute allergic otitis media of right ear Right ear discomfort and pain for two weeks, likely due to allergies causing fluid accumulation. No infection observed. - Prescribed ear drops for right ear, three drops three times daily based on discomfort severity. - Prescribed cetirizine  once daily for 7-10 days for allergy management. Alcohol use disorder, moderate, dependence (HCC) Consumes four cans of beer daily. Acknowledged negative health impact. Previous cessation attempts unsuccessful. - Encouraged contacting therapist for support in managing alcohol use disorder.  Moderate episode of recurrent major depressive disorder (HCC) On Cymbalta  and trazodone . Lives alone, limited therapy engagement. Discussed holiday season and family losses impact. - Encouraged contacting therapist for ongoing support and management. - Discussed potential referral to in-house therapist if needed.     Return if symptoms worsen or fail to improve, for keep next appt .  Patient was given opportunity to ask questions. Patient verbalized understanding of the plan and was able to repeat key elements of the plan. All questions were answered to their satisfaction.    I, Maureen Creighton, NP, have reviewed all documentation for this visit. The documentation on 09/16/2024 for the exam, diagnosis, procedures, and orders are all accurate and complete.    IF YOU HAVE BEEN REFERRED TO A SPECIALIST, IT MAY TAKE 1-2 WEEKS TO SCHEDULE/PROCESS THE REFERRAL. IF YOU HAVE NOT HEARD FROM US /SPECIALIST IN TWO WEEKS, PLEASE GIVE US  A  CALL AT [phone] X [phone].

## 2024-09-17 DIAGNOSIS — H65111 Acute and subacute allergic otitis media (mucoid) (sanguinous) (serous), right ear: Secondary | ICD-10-CM | POA: Insufficient documentation

## 2024-09-17 NOTE — Assessment & Plan Note (Signed)
 On Cymbalta  and trazodone . Lives alone, limited therapy engagement. Discussed holiday season and family losses impact. - Encouraged contacting therapist for ongoing support and management. - Discussed potential referral to in-house therapist if needed.

## 2024-09-17 NOTE — Assessment & Plan Note (Signed)
 Consumes four cans of beer daily. Acknowledged negative health impact. Previous cessation attempts unsuccessful. - Encouraged contacting therapist for support in managing alcohol use disorder.

## 2024-09-17 NOTE — Assessment & Plan Note (Signed)
 Right ear discomfort and pain for two weeks, likely due to allergies causing fluid accumulation. No infection observed. - Prescribed ear drops for right ear, three drops three times daily based on discomfort severity. - Prescribed cetirizine  once daily for 7-10 days for allergy management.

## 2024-09-25 ENCOUNTER — Other Ambulatory Visit: Payer: Self-pay | Admitting: Internal Medicine

## 2024-09-27 ENCOUNTER — Other Ambulatory Visit: Payer: Self-pay | Admitting: Internal Medicine

## 2024-10-15 ENCOUNTER — Ambulatory Visit: Payer: Self-pay | Admitting: Internal Medicine

## 2024-10-15 ENCOUNTER — Encounter: Payer: Self-pay | Admitting: Internal Medicine

## 2024-10-15 VITALS — BP 102/60 | HR 85 | Temp 97.9°F | Ht 62.0 in | Wt 110.0 lb

## 2024-10-15 DIAGNOSIS — I2510 Atherosclerotic heart disease of native coronary artery without angina pectoris: Secondary | ICD-10-CM | POA: Diagnosis not present

## 2024-10-15 DIAGNOSIS — R197 Diarrhea, unspecified: Secondary | ICD-10-CM

## 2024-10-15 DIAGNOSIS — F1020 Alcohol dependence, uncomplicated: Secondary | ICD-10-CM

## 2024-10-15 DIAGNOSIS — F331 Major depressive disorder, recurrent, moderate: Secondary | ICD-10-CM

## 2024-10-15 DIAGNOSIS — F17210 Nicotine dependence, cigarettes, uncomplicated: Secondary | ICD-10-CM

## 2024-10-15 DIAGNOSIS — R6881 Early satiety: Secondary | ICD-10-CM | POA: Diagnosis not present

## 2024-10-15 DIAGNOSIS — I700 Atherosclerosis of aorta: Secondary | ICD-10-CM

## 2024-10-15 DIAGNOSIS — D508 Other iron deficiency anemias: Secondary | ICD-10-CM

## 2024-10-15 DIAGNOSIS — I119 Hypertensive heart disease without heart failure: Secondary | ICD-10-CM

## 2024-10-15 LAB — CBC
Hematocrit: 42.6 % (ref 34.0–46.6)
Hemoglobin: 14.6 g/dL (ref 11.1–15.9)
MCH: 33.6 pg — ABNORMAL HIGH (ref 26.6–33.0)
MCHC: 34.3 g/dL (ref 31.5–35.7)
MCV: 98 fL — ABNORMAL HIGH (ref 79–97)
Platelets: 491 x10E3/uL — ABNORMAL HIGH (ref 150–450)
RBC: 4.34 x10E6/uL (ref 3.77–5.28)
RDW: 11.8 % (ref 11.7–15.4)
WBC: 5.5 x10E3/uL (ref 3.4–10.8)

## 2024-10-15 LAB — IRON,TIBC AND FERRITIN PANEL
Ferritin: 73 ng/mL (ref 15–150)
Iron Saturation: 28 % (ref 15–55)
Iron: 70 ug/dL (ref 27–139)
Total Iron Binding Capacity: 252 ug/dL (ref 250–450)
UIBC: 182 ug/dL (ref 118–369)

## 2024-10-15 LAB — CMP14+EGFR
ALT: 17 IU/L (ref 0–32)
AST: 19 IU/L (ref 0–40)
Albumin: 4.2 g/dL (ref 3.8–4.8)
Alkaline Phosphatase: 55 IU/L (ref 49–135)
BUN/Creatinine Ratio: 10 — ABNORMAL LOW (ref 12–28)
BUN: 6 mg/dL — ABNORMAL LOW (ref 8–27)
Bilirubin Total: 0.3 mg/dL (ref 0.0–1.2)
CO2: 21 mmol/L (ref 20–29)
Calcium: 10 mg/dL (ref 8.7–10.3)
Chloride: 96 mmol/L (ref 96–106)
Creatinine, Ser: 0.6 mg/dL (ref 0.57–1.00)
Globulin, Total: 2.1 g/dL (ref 1.5–4.5)
Glucose: 116 mg/dL — ABNORMAL HIGH (ref 70–99)
Potassium: 4.5 mmol/L (ref 3.5–5.2)
Sodium: 133 mmol/L — ABNORMAL LOW (ref 134–144)
Total Protein: 6.3 g/dL (ref 6.0–8.5)
eGFR: 95 mL/min/1.73

## 2024-10-15 LAB — LIPID PANEL
Chol/HDL Ratio: 1.5 ratio (ref 0.0–4.4)
Cholesterol, Total: 117 mg/dL (ref 100–199)
HDL: 78 mg/dL
LDL Chol Calc (NIH): 26 mg/dL (ref 0–99)
Triglycerides: 59 mg/dL (ref 0–149)
VLDL Cholesterol Cal: 13 mg/dL (ref 5–40)

## 2024-10-15 LAB — TSH: TSH: 1.78 u[IU]/mL (ref 0.450–4.500)

## 2024-10-15 MED ORDER — ATORVASTATIN CALCIUM 10 MG PO TABS: ORAL_TABLET | ORAL | 1 refills | Status: AC

## 2024-10-15 NOTE — Progress Notes (Unsigned)
 I,[name], CMA,acting as a neurosurgeon for Catheryn LOISE Slocumb, MD.,have documented all relevant documentation on the behalf of Catheryn LOISE Slocumb, MD,as directed by  Catheryn LOISE Slocumb, MD while in the presence of Catheryn LOISE Slocumb, MD.  Subjective:  Patient ID: Maureen Rodgers , female    DOB: [birth year] , 74 y.o.   MRN: [id number]  Chief Complaint  Patient presents with   Hypertension    Patient presents today for a bp check. Patient reports compliance with her meds. Patient denies having any chest pain, sob or headaches at this time. Patient reports she has been having diarrhea a lot.    HPI Discussed the use of AI scribe software for clinical note transcription with the patient, who gave verbal consent to proceed.  History of Present Illness    Hypertension This is a chronic problem. The current episode started more than 1 year ago. The problem has been gradually improving since onset. The problem is controlled. Risk factors for coronary artery disease include post-menopausal state, smoking/tobacco exposure and dyslipidemia. Past treatments include calcium  channel blockers. The current treatment provides moderate improvement. Compliance problems include exercise.      Past Medical History:  Diagnosis Date   Anxiety 06/07/2023   Ascending aortic aneurysm 11/29/2021   Depression    Essential hypertension 11/29/2021   GERD (gastroesophageal reflux disease)    Tobacco use      Family History  Problem Relation Age of Onset   Heart disease Mother    Dementia Mother    Alcohol abuse Mother    Alcohol abuse Father    Heart disease Brother    Diabetes Brother    Heart attack Maternal Grandmother      Current Outpatient Medications:    alendronate (FOSAMAX) 70 MG tablet, TAKE 1 TABLET (70 MG TOTAL) BY MOUTH EVERY SUNDAY., Disp: 12 tablet, Rfl: 2   ALPRAZolam  (XANAX ) 1 MG tablet, Take 1 mg by mouth 3 (three) times daily as needed for anxiety or sleep., Disp: , Rfl:     amLODipine  (NORVASC ) 10 MG tablet, TAKE 1 TABLET BY MOUTH EVERY DAY, Disp: 90 tablet, Rfl: 2   calcium  carbonate (OS-CAL - DOSED IN MG OF ELEMENTAL CALCIUM ) 1250 (500 Ca) MG tablet, Take 1 tablet by mouth daily., Disp: , Rfl:    cholecalciferol (VITAMIN D3) 25 MCG (1000 UNIT) tablet, Take 1,000 Units by mouth daily., Disp: , Rfl:    cycloSPORINE  (RESTASIS ) 0.05 % ophthalmic emulsion, Place 1 drop into both eyes 2 (two) times daily., Disp: , Rfl:    DULoxetine  (CYMBALTA ) 60 MG capsule, Take 60 mg by mouth at bedtime., Disp: , Rfl:    Iron-FA-B Cmp-C-Biot-Probiotic (FUSION PLUS) CAPS, TAKE 1 CAPSULE BY MOUTH MONDAY THROUGH FRIDAYS, Disp: 30 capsule, Rfl: 3   Magnesium 400 MG TABS, Take 1 tablet by mouth daily., Disp: , Rfl:    meloxicam (MOBIC) 15 MG tablet, Take 15 mg by mouth daily., Disp: , Rfl:    Omega-3 Fatty Acids (FISH OIL) 1000 MG CAPS, Take 1,000 mg by mouth daily. , Disp: , Rfl:    traZODone  (DESYREL ) 100 MG tablet, Take 100 mg by mouth at bedtime., Disp: , Rfl:    acetaminophen  (TYLENOL ) 650 MG CR tablet, Take 650 mg by mouth as needed for pain. (Patient not taking: Reported on 10/15/2024), Disp: , Rfl:    acetic acid  2 % otic solution, Place 4 drops into the right ear 3 (three) times daily. (Patient not taking: Reported on 10/15/2024), Disp:  45 mL, Rfl: 0   atorvastatin  (LIPITOR) 10 MG tablet, Take 1 tablet (10 mg) by mouth once daily at bedtime. Please call the office at [phone] to schedule a follow up appointment for future refills. (Patient not taking: Reported on 10/15/2024), Disp: 30 tablet, Rfl: 1   Carboxymethylcellulose Sodium (EYE DROPS OP), Place 1 drop into both eyes 3 (three) times daily as needed (dry eyes). (Patient not taking: Reported on 10/15/2024), Disp: , Rfl:    cetirizine  (ZYRTEC  ALLERGY) 10 MG tablet, Take 1 tablet (10 mg total) by mouth daily. (Patient not taking: Reported on 10/15/2024), Disp: 90 tablet, Rfl: 0   esomeprazole (NEXIUM) 40 MG  capsule, Take 40 mg by mouth daily at 12 noon. (Patient not taking: Reported on 10/15/2024), Disp: , Rfl:    No Known Allergies   Review of Systems  Constitutional: Negative.   Eyes: Negative.   Respiratory: Negative.    Cardiovascular: Negative.   Gastrointestinal:  Positive for diarrhea.  Musculoskeletal: Negative.   Skin: Negative.   Psychiatric/Behavioral: Negative.       Today's Vitals   10/15/24 1004  BP: 102/60  Pulse: 85  Temp: 97.9 F (36.6 C)  TempSrc: Oral  Weight: 110 lb (49.9 kg)  Height: 5' 2 (1.575 m)  PainSc: 0-No pain   Body mass index is 20.12 kg/m.  Wt Readings from Last 3 Encounters:  10/15/24 110 lb (49.9 kg)  09/10/24 119 lb (54 kg)  04/24/24 130 lb (59 kg)    The ASCVD Risk score (Arnett DK, et al., 2019) failed to calculate for the following reasons:   The valid HDL cholesterol range is 20 to 100 mg/dL  Objective:  Physical Exam Vitals and nursing note reviewed.  Constitutional:      Appearance: Normal appearance.  HENT:     Head: Normocephalic and atraumatic.  Eyes:     Extraocular Movements: Extraocular movements intact.  Cardiovascular:     Rate and Rhythm: Normal rate and regular rhythm.     Heart sounds: Normal heart sounds.  Pulmonary:     Effort: Pulmonary effort is normal.     Breath sounds: Normal breath sounds.  Genitourinary:    Comments: Deferred  Musculoskeletal:     Cervical back: Normal range of motion.  Skin:    General: Skin is warm.  Neurological:     General: No focal deficit present.     Mental Status: She is alert.  Psychiatric:        Mood and Affect: Mood normal.        Behavior: Behavior normal.         Assessment And Plan:   Assessment & Plan Hypertensive heart disease without heart failure  Atherosclerosis of aorta Chronic, LDL goal < 70.  She will c/w atorvastatin  10mg  daily. She is encouraged to follow heart healthy lifestyle.  Coronary artery disease involving native coronary artery of  native heart without angina pectoris  Moderate episode of recurrent major depressive disorder (HCC)  Alcohol use disorder, moderate, dependence (HCC)  Cigarette nicotine dependence without complication   Assessment & Plan    No orders of the defined types were placed in this encounter.  Return in about 6 months (around 04/14/2025) for bpc.  Patient was given opportunity to ask questions. Patient verbalized understanding of the plan and was able to repeat key elements of the plan. All questions were answered to their satisfaction.    I, Catheryn LOISE Slocumb, MD, have reviewed all documentation for this visit.  The documentation on 10/15/2024 for the exam, diagnosis, procedures, and orders are all accurate and complete.   IF YOU HAVE BEEN REFERRED TO A SPECIALIST, IT MAY TAKE 1-2 WEEKS TO SCHEDULE/PROCESS THE REFERRAL. IF YOU HAVE NOT HEARD FROM US /SPECIALIST IN TWO WEEKS, PLEASE GIVE US  A CALL AT [phone].

## 2024-10-15 NOTE — Assessment & Plan Note (Signed)
Chronic, LDL goal < 70.  She will c/w atorvastatin 10mg  daily. She is encouraged to follow heart healthy lifestyle.

## 2024-10-15 NOTE — Patient Instructions (Signed)
 Hypertension, Adult Hypertension is another name for high blood pressure. High blood pressure forces your heart to work harder to pump blood. This can cause problems over time. There are two numbers in a blood pressure reading. There is a top number (systolic) over a bottom number (diastolic). It is best to have a blood pressure that is below 120/80. What are the causes? The cause of this condition is not known. Some other conditions can lead to high blood pressure. What increases the risk? Some lifestyle factors can make you more likely to develop high blood pressure: Smoking. Not getting enough exercise or physical activity. Being overweight. Having too much fat, sugar, calories, or salt (sodium) in your diet. Drinking too much alcohol. Other risk factors include: Having any of these conditions: Heart disease. Diabetes. High cholesterol. Kidney disease. Obstructive sleep apnea. Having a family history of high blood pressure and high cholesterol. Age. The risk increases with age. Stress. What are the signs or symptoms? High blood pressure may not cause symptoms. Very high blood pressure (hypertensive crisis) may cause: Headache. Fast or uneven heartbeats (palpitations). Shortness of breath. Nosebleed. Vomiting or feeling like you may vomit (nauseous). Changes in how you see. Very bad chest pain. Feeling dizzy. Seizures. How is this treated? This condition is treated by making healthy lifestyle changes, such as: Eating healthy foods. Exercising more. Drinking less alcohol. Your doctor may prescribe medicine if lifestyle changes do not help enough and if: Your top number is above 130. Your bottom number is above 80. Your personal target blood pressure may vary. Follow these instructions at home: Eating and drinking  If told, follow the DASH eating plan. To follow this plan: Fill one half of your plate at each meal with fruits and vegetables. Fill one fourth of your plate  at each meal with whole grains. Whole grains include whole-wheat pasta, brown rice, and whole-grain bread. Eat or drink low-fat dairy products, such as skim milk or low-fat yogurt. Fill one fourth of your plate at each meal with low-fat (lean) proteins. Low-fat proteins include fish, chicken without skin, eggs, beans, and tofu. Avoid fatty meat, cured and processed meat, or chicken with skin. Avoid pre-made or processed food. Limit the amount of salt in your diet to less than 1,500 mg each day. Do not drink alcohol if: Your doctor tells you not to drink. You are pregnant, may be pregnant, or are planning to become pregnant. If you drink alcohol: Limit how much you have to: 0-1 drink a day for women. 0-2 drinks a day for men. Know how much alcohol is in your drink. In the [place] one drink equals one 12 oz bottle of beer (355 mL), one 5 oz glass of wine (148 mL), or one 1 oz glass of hard liquor (44 mL). Lifestyle  Work with your doctor to stay at a healthy weight or to lose weight. Ask your doctor what the best weight is for you. Get at least 30 minutes of exercise that causes your heart to beat faster (aerobic exercise) most days of the week. This may include walking, swimming, or biking. Get at least 30 minutes of exercise that strengthens your muscles (resistance exercise) at least 3 days a week. This may include lifting weights or doing Pilates. Do not smoke or use any products that contain nicotine or tobacco. If you need help quitting, ask your doctor. Check your blood pressure at home as told by your doctor. Keep all follow-up visits. Medicines Take over-the-counter and prescription medicines  only as told by your doctor. Follow directions carefully. Do not skip doses of blood pressure medicine. The medicine does not work as well if you skip doses. Skipping doses also puts you at risk for problems. Ask your doctor about side effects or reactions to medicines that you should watch  for. Contact a doctor if: You think you are having a reaction to the medicine you are taking. You have headaches that keep coming back. You feel dizzy. You have swelling in your ankles. You have trouble with your vision. Get help right away if: You get a very bad headache. You start to feel mixed up (confused). You feel weak or numb. You feel faint. You have very bad pain in your: Chest. Belly (abdomen). You vomit more than once. You have trouble breathing. These symptoms may be an emergency. Get help right away. Call 911. Do not wait to see if the symptoms will go away. Do not drive yourself to the hospital. Summary Hypertension is another name for high blood pressure. High blood pressure forces your heart to work harder to pump blood. For most people, a normal blood pressure is less than 120/80. Making healthy choices can help lower blood pressure. If your blood pressure does not get lower with healthy choices, you may need to take medicine. This information is not intended to replace advice given to you by your health care provider. Make sure you discuss any questions you have with your health care provider. Document Revised: 07/08/2021 Document Reviewed: 07/08/2021 Elsevier Patient Education  2024 [employer].

## 2024-10-17 NOTE — Assessment & Plan Note (Signed)
 She has greater than 30 pack year history of tobacco use. Her last LDCT was in January 2025, next one due January 2026.

## 2024-10-17 NOTE — Assessment & Plan Note (Signed)
 Chronic.  Increased depression scores possibly related to family losses and personal stressors. - Continue duloxetine  - Encourage follow-up with therapist Richerd Kerns.

## 2024-10-17 NOTE — Assessment & Plan Note (Signed)
Chronic, well controlled. She will c/w amlodipine 10mg  daily. She is encouraged to follow low sodium diet.  She will f/u in six months for re-evaluation.

## 2024-10-17 NOTE — Assessment & Plan Note (Signed)
 Chronic, LDL goal < 70.  She will c/w atorvastatin  10mg  daily. She is encouraged to follow heart healthy lifestyle.  - Consider anti-platelet therapy; however, would be at increased risk of gastritis due to regular alcohol intake.

## 2024-10-17 NOTE — Assessment & Plan Note (Signed)
 She was given stool cards to use if she is positive for anemia today.  - Consider GI evaluation if needed.  - Check labs as below.

## 2024-10-17 NOTE — Assessment & Plan Note (Signed)
 Chronic, not currently on medication therapy. Has been on Antabuse in the past. Currently followed by [facility], she has now agreed to attending AA meetings.

## 2024-10-20 ENCOUNTER — Ambulatory Visit: Payer: Self-pay | Admitting: Internal Medicine

## 2024-10-24 ENCOUNTER — Other Ambulatory Visit: Payer: Self-pay | Admitting: Internal Medicine

## 2024-10-28 ENCOUNTER — Other Ambulatory Visit: Payer: Self-pay | Admitting: Internal Medicine

## 2024-10-30 ENCOUNTER — Other Ambulatory Visit: Payer: Self-pay | Admitting: Cardiovascular Disease

## 2024-11-05 ENCOUNTER — Ambulatory Visit
Admission: RE | Admit: 2024-11-05 | Discharge: 2024-11-05 | Disposition: A | Source: Ambulatory Visit | Attending: Internal Medicine | Admitting: Internal Medicine

## 2024-11-05 ENCOUNTER — Other Ambulatory Visit: Payer: Self-pay | Admitting: Internal Medicine

## 2024-11-05 DIAGNOSIS — F17210 Nicotine dependence, cigarettes, uncomplicated: Secondary | ICD-10-CM

## 2025-04-14 ENCOUNTER — Ambulatory Visit: Payer: Self-pay | Admitting: Internal Medicine
